# Patient Record
Sex: Male | Born: 1980 | Race: Black or African American | Hispanic: No | Marital: Married | Smoking: Never smoker
Health system: Southern US, Community
[De-identification: ages and names within clinical notes are randomized; demographics above are authoritative.]

## PROBLEM LIST (undated history)

## (undated) DIAGNOSIS — R569 Unspecified convulsions: Secondary | ICD-10-CM

## (undated) DIAGNOSIS — I10 Essential (primary) hypertension: Secondary | ICD-10-CM

## (undated) DIAGNOSIS — I499 Cardiac arrhythmia, unspecified: Secondary | ICD-10-CM

## (undated) DIAGNOSIS — F419 Anxiety disorder, unspecified: Secondary | ICD-10-CM

## (undated) DIAGNOSIS — K219 Gastro-esophageal reflux disease without esophagitis: Secondary | ICD-10-CM

## (undated) DIAGNOSIS — I480 Paroxysmal atrial fibrillation: Secondary | ICD-10-CM

## (undated) HISTORY — PX: NO PAST SURGERIES: SHX2092

---

## 2014-04-25 ENCOUNTER — Encounter (HOSPITAL_COMMUNITY): Payer: Self-pay | Admitting: Emergency Medicine

## 2014-04-25 ENCOUNTER — Emergency Department (INDEPENDENT_AMBULATORY_CARE_PROVIDER_SITE_OTHER)
Admission: EM | Admit: 2014-04-25 | Discharge: 2014-04-25 | Disposition: A | Discharge status: Home / Self Care | Payer: Self-pay | Source: Home / Self Care | Attending: Family Medicine | Admitting: Family Medicine

## 2014-04-25 ENCOUNTER — Emergency Department (INDEPENDENT_AMBULATORY_CARE_PROVIDER_SITE_OTHER): Payer: Self-pay

## 2014-04-25 DIAGNOSIS — M79672 Pain in left foot: Secondary | ICD-10-CM

## 2014-04-25 DIAGNOSIS — I10 Essential (primary) hypertension: Secondary | ICD-10-CM

## 2014-04-25 LAB — POCT I-STAT, CHEM 8
BUN: 18 mg/dL (ref 6–23)
CALCIUM ION: 1.17 mmol/L (ref 1.12–1.23)
CREATININE: 1.1 mg/dL (ref 0.50–1.35)
Chloride: 106 mmol/L (ref 96–112)
GLUCOSE: 109 mg/dL — AB (ref 70–99)
HEMATOCRIT: 47 % (ref 39.0–52.0)
HEMOGLOBIN: 16 g/dL (ref 13.0–17.0)
POTASSIUM: 3.6 mmol/L (ref 3.5–5.1)
Sodium: 138 mmol/L (ref 135–145)
TCO2: 19 mmol/L (ref 0–100)

## 2014-04-25 MED ORDER — AMLODIPINE BESYLATE 10 MG PO TABS: 10.0000 mg | ORAL_TABLET | Freq: Every day | ORAL | Status: DC

## 2014-04-25 NOTE — ED Notes (Signed)
Pt states that he started having left foot pain since last night at work pt denied fall or injury.

## 2014-04-25 NOTE — ED Provider Notes (Signed)
Devon Hanson is a 34 y.o. male who presents to Urgent Care today for left foot pain. Patient has a three-day history of left foot pain. He notes the pain occurred while walking. He denies any significant radiating pain weakness or numbness. The pain is located at the distal second metatarsal. Pain is worse with activity and better with rest. No fevers chills nausea vomiting or diarrhea. Ex  Patient was incidentally noted to have elevated blood pressure. He denies taking any medications for pain or other medicines. He denies any personal history of elevated blood pressure. He feels well otherwise. No syncope chest pain palpitations shortness of breath.   History reviewed. No pertinent past medical history. History reviewed. No pertinent past surgical history. History  Substance Use Topics  . Smoking status: Never Smoker   . Smokeless tobacco: Not on file  . Alcohol Use: Yes   ROS as above Medications: No current facility-administered medications for this encounter.   Current Outpatient Prescriptions  Medication Sig Dispense Refill  . amLODipine (NORVASC) 10 MG tablet Take 1 tablet (10 mg total) by mouth daily. 30 tablet 0   No Known Allergies   Exam:  BP 170/103 mmHg  Pulse 99  Temp(Src) 99.5 F (37.5 C) (Oral)  Resp 20  SpO2 99% Gen: Well NAD HEENT: EOMI,  MMM Lungs: Normal work of breathing. CTABL Heart: RRR no MRG Abd: NABS, Soft. Nondistended, Nontender Exts: Brisk capillary refill, warm and well perfused.  Left foot: Ankle nontender no swelling. Foot without any significant swelling. Mildly tender to palpation distal second metatarsal. Motion Refill sensation and strength intact. Pulses intact.  Results for orders placed or performed during the hospital encounter of 04/25/14 (from the past 24 hour(s))  I-STAT, chem 8     Status: Abnormal   Collection Time: 04/25/14  4:36 PM  Result Value Ref Range   Sodium 138 135 - 145 mmol/L   Potassium 3.6 3.5 - 5.1 mmol/L   Chloride 106 96 - 112 mmol/L   BUN 18 6 - 23 mg/dL   Creatinine, Ser 1.611.10 0.50 - 1.35 mg/dL   Glucose, Bld 096109 (H) 70 - 99 mg/dL   Calcium, Ion 0.451.17 4.091.12 - 1.23 mmol/L   TCO2 19 0 - 100 mmol/L   Hemoglobin 16.0 13.0 - 17.0 g/dL   HCT 81.147.0 91.439.0 - 78.252.0 %   Dg Foot Complete Left  04/25/2014   CLINICAL DATA:  Foot pain and burning after walking. No known injury. Initial encounter.  EXAM: LEFT FOOT - COMPLETE 3+ VIEW  COMPARISON:  None.  FINDINGS: The mineralization and alignment are normal. There is no evidence of acute fracture or dislocation. The joint spaces are maintained. Small plantar calcaneal spur noted.  IMPRESSION: No acute osseous findings.   Electronically Signed   By: Carey BullocksWilliam  Veazey M.D.   On: 04/25/2014 16:59    Assessment and Plan: 34 y.o. male with  1) foot pain: Unclear etiology. Likely a self-limiting process. We'll use postoperative shoe for a short duration and Tylenol for pain control. Return in about 2 weeks for recheck 2) hypertension: Start amlodipine. Return in 2 weeks  Discussed warning signs or symptoms. Please see discharge instructions. Patient expresses understanding.     Rodolph BongEvan S Bhavesh Vazquez, MD 04/25/14 [phone]

## 2014-04-25 NOTE — Discharge Instructions (Signed)
Thank you for coming in today. Use the postoperative she was needed. Take amlodipine daily. Return on the 29th, 30th, 31st. Ask for Dr. Denyse Amassorey.  Hypertension Hypertension, commonly called high blood pressure, is when the force of blood pumping through your arteries is too strong. Your arteries are the blood vessels that carry blood from your heart throughout your body. A blood pressure reading consists of a higher number over a lower number, such as 110/72. The higher number (systolic) is the pressure inside your arteries when your heart pumps. The lower number (diastolic) is the pressure inside your arteries when your heart relaxes. Ideally you want your blood pressure below 120/80. Hypertension forces your heart to work harder to pump blood. Your arteries may become narrow or stiff. Having hypertension puts you at risk for heart disease, stroke, and other problems.  RISK FACTORS Some risk factors for high blood pressure are controllable. Others are not.  Risk factors you cannot control include:   Race. You may be at higher risk if you are African American.  Age. Risk increases with age.  Gender. Men are at higher risk than women before age 645 years. After age 32, women are at higher risk than men. Risk factors you can control include:  Not getting enough exercise or physical activity.  Being overweight.  Getting too much fat, sugar, calories, or salt in your diet.  Drinking too much alcohol. SIGNS AND SYMPTOMS Hypertension does not usually cause signs or symptoms. Extremely high blood pressure (hypertensive crisis) may cause headache, anxiety, shortness of breath, and nosebleed. DIAGNOSIS  To check if you have hypertension, your health care provider will measure your blood pressure while you are seated, with your arm held at the level of your heart. It should be measured at least twice using the same arm. Certain conditions can cause a difference in blood pressure between your right and  left arms. A blood pressure reading that is higher than normal on one occasion does not mean that you need treatment. If one blood pressure reading is high, ask your health care provider about having it checked again. TREATMENT  Treating high blood pressure includes making lifestyle changes and possibly taking medicine. Living a healthy lifestyle can help lower high blood pressure. You may need to change some of your habits. Lifestyle changes may include:  Following the DASH diet. This diet is high in fruits, vegetables, and whole grains. It is low in salt, red meat, and added sugars.  Getting at least 2 hours of brisk physical activity every week.  Losing weight if necessary.  Not smoking.  Limiting alcoholic beverages.  Learning ways to reduce stress. If lifestyle changes are not enough to get your blood pressure under control, your health care provider may prescribe medicine. You may need to take more than one. Work closely with your health care provider to understand the risks and benefits. HOME CARE INSTRUCTIONS  Have your blood pressure rechecked as directed by your health care provider.   Take medicines only as directed by your health care provider. Follow the directions carefully. Blood pressure medicines must be taken as prescribed. The medicine does not work as well when you skip doses. Skipping doses also puts you at risk for problems.   Do not smoke.   Monitor your blood pressure at home as directed by your health care provider. SEEK MEDICAL CARE IF:   You think you are having a reaction to medicines taken.  You have recurrent headaches or feel dizzy.  You have swelling in your ankles.  You have trouble with your vision. SEEK IMMEDIATE MEDICAL CARE IF:  You develop a severe headache or confusion.  You have unusual weakness, numbness, or feel faint.  You have severe chest or abdominal pain.  You vomit repeatedly.  You have trouble breathing. MAKE SURE  YOU:   Understand these instructions.  Will watch your condition.  Will get help right away if you are not doing well or get worse. Document Released: 02/01/2005 Document Revised: 06/18/2013 Document Reviewed: 11/24/2012 [facility] Patient Information 2015 [place], [place]. This information is not intended to replace advice given to you by your health care provider. Make sure you discuss any questions you have with your health care provider.

## 2016-09-26 ENCOUNTER — Emergency Department (HOSPITAL_COMMUNITY): Payer: Managed Care, Other (non HMO)

## 2016-09-26 ENCOUNTER — Encounter (HOSPITAL_COMMUNITY): Payer: Self-pay | Admitting: *Deleted

## 2016-09-26 ENCOUNTER — Emergency Department (HOSPITAL_COMMUNITY)
Admission: EM | Admit: 2016-09-26 | Discharge: 2016-09-26 | Disposition: A | Discharge status: Home / Self Care | Payer: Managed Care, Other (non HMO) | Attending: Emergency Medicine | Admitting: Emergency Medicine

## 2016-09-26 DIAGNOSIS — I10 Essential (primary) hypertension: Secondary | ICD-10-CM | POA: Diagnosis not present

## 2016-09-26 DIAGNOSIS — Z79899 Other long term (current) drug therapy: Secondary | ICD-10-CM | POA: Diagnosis not present

## 2016-09-26 HISTORY — DX: Essential (primary) hypertension: I10

## 2016-09-26 LAB — CBC
HCT: 41.4 % (ref 39.0–52.0)
Hemoglobin: 14.7 g/dL (ref 13.0–17.0)
MCH: 31.2 pg (ref 26.0–34.0)
MCHC: 35.5 g/dL (ref 30.0–36.0)
MCV: 87.9 fL (ref 78.0–100.0)
Platelets: 287 10*3/uL (ref 150–400)
RBC: 4.71 MIL/uL (ref 4.22–5.81)
RDW: 12.4 % (ref 11.5–15.5)
WBC: 5.5 10*3/uL (ref 4.0–10.5)

## 2016-09-26 LAB — BASIC METABOLIC PANEL
Anion gap: 10 (ref 5–15)
BUN: 9 mg/dL (ref 6–20)
CALCIUM: 9.2 mg/dL (ref 8.9–10.3)
CO2: 23 mmol/L (ref 22–32)
Chloride: 102 mmol/L (ref 101–111)
Creatinine, Ser: 1.14 mg/dL (ref 0.61–1.24)
GFR calc Af Amer: >60 mL/min (ref >60)
GLUCOSE: 106 mg/dL — AB (ref 65–99)
Potassium: 3.3 mmol/L — ABNORMAL LOW (ref 3.5–5.1)
Sodium: 135 mmol/L (ref 135–145)

## 2016-09-26 LAB — TROPONIN I: Troponin I: 0.03 ng/mL (ref <0.03)

## 2016-09-26 MED ORDER — METOPROLOL TARTRATE 25 MG PO TABS: 25.0000 mg | ORAL_TABLET | Freq: Two times a day (BID) | ORAL | 0 refills | Status: DC

## 2016-09-26 MED ORDER — AMLODIPINE BESYLATE 10 MG PO TABS: 10.0000 mg | ORAL_TABLET | Freq: Every day | ORAL | 0 refills | Status: DC

## 2016-09-26 NOTE — ED Provider Notes (Signed)
[facility]EMER[facility]ENCY DEPT Provider Note   CSN: [id number] Arrival date & time: 09/26/16  1708     History   Chief Complaint Chief Complaint  Patient presents with  . Hypertension    HPI Devon Hanson is a 36 y.o. male.  Pt presents to the ED today with htn.  The pt does not have a hx of htn and said he's been taking his norvasc.  He does not have a pcp, but has been getting his medication from "clinics."  The pt told triage nurse that he had some cp earlier, but none now.  Pt denies any headache.      Past Medical History:  Diagnosis Date  . Hypertension     There are no active problems to display for this patient.   History reviewed. No pertinent surgical history.     Home Medications    Prior to Admission medications   Medication Sig Start Date End Date Taking? Authorizing Provider  amLODipine (NORVASC) 10 MG tablet Take 1 tablet (10 mg total) by mouth daily. 09/26/16   Jacalyn LefevreHaviland, Trystian Crisanto, MD  metoprolol tartrate (LOPRESSOR) 25 MG tablet Take 1 tablet (25 mg total) by mouth 2 (two) times daily. 09/26/16   Jacalyn LefevreHaviland, Torra Pala, MD    Family History No family history on file.  Social History Social History  Substance Use Topics  . Smoking status: Never Smoker  . Smokeless tobacco: Never Used  . Alcohol use Yes     Allergies   Patient has no known allergies.   Review of Systems Review of Systems  All other systems reviewed and are negative.    Physical Exam Updated Vital Signs BP (!) 174/99 (BP Location: Right Arm)   Pulse 95   Temp 98.1 F (36.7 C) (Oral)   Resp 18   Ht 5\' 8"  (1.727 m)   Wt 99.3 kg (219 lb)   SpO2 95%   BMI 33.30 kg/m   Physical Exam  Constitutional: He is oriented to person, place, and time. He appears well-developed and well-nourished.  HENT:  Head: Normocephalic and atraumatic.  Right Ear: External ear normal.  Left Ear: External ear normal.  Nose: Nose normal.  Mouth/Throat: Oropharynx is clear and moist.  Eyes: Pupils  are equal, round, and reactive to light. Conjunctivae and EOM are normal.  Neck: Normal range of motion. Neck supple.  Cardiovascular: Normal rate, regular rhythm, normal heart sounds and intact distal pulses.   Pulmonary/Chest: Effort normal and breath sounds normal.  Abdominal: Soft. Bowel sounds are normal.  Musculoskeletal: Normal range of motion.  Neurological: He is alert and oriented to person, place, and time.  Skin: Skin is warm.  Psychiatric: He has a normal mood and affect. His behavior is normal. Judgment and thought content normal.  Nursing note and vitals reviewed.    ED Treatments / Results  Labs (all labs ordered are listed, but only abnormal results are displayed) Labs Reviewed  BASIC METABOLIC PANEL - Abnormal; Notable for the following:       Result Value   Potassium 3.3 (*)    Glucose, Bld 106 (*)    All other components within normal limits  TROPONIN I - Abnormal; Notable for the following:    Troponin I 0.03 (*)    All other components within normal limits  CBC    EKG  EKG Interpretation  Date/Time:  Sunday September 26 2016 17:41:15 EDT Ventricular Rate:  92 PR Interval:  170 QRS Duration: 78 QT Interval:  342 QTC Calculation: 422  R Axis:     Text Interpretation:  Normal sinus rhythm Normal ECG Confirmed by Jacalyn Lefevre [phone]) on 09/26/2016 6:59:25 PM       Radiology Dg Chest 2 View  Result Date: 09/26/2016 CLINICAL DATA:  Tightness in chest.  Elevated blood pressure. EXAM: CHEST  2 VIEW COMPARISON:  None. FINDINGS: The heart size and mediastinal contours are within normal limits. Both lungs are clear. The visualized skeletal structures are unremarkable. IMPRESSION: No active cardiopulmonary disease. Electronically Signed   By: Gerome Sam III M.D   On: 09/26/2016 18:59    Procedures Procedures (including critical care time)  Medications Ordered in ED Medications - No data to display   Initial Impression / Assessment and Plan / ED  Course  I have reviewed the triage vital signs and the nursing notes.  Pertinent labs & imaging results that were available during my care of the patient were reviewed by me and considered in my medical decision making (see chart for details).     Pt is on max dose of amlodipine.  I will add beta blocker.  Case management consulted to help get pt a doctor as his bp needs to be better managed.  Pt knows to return for any concerns and f/u with pcp.  Final Clinical Impressions(s) / ED Diagnoses   Final diagnoses:  Essential hypertension    New Prescriptions New Prescriptions   METOPROLOL TARTRATE (LOPRESSOR) 25 MG TABLET    Take 1 tablet (25 mg total) by mouth 2 (two) times daily.     Jacalyn Lefevre, MD 09/26/16 [phone]

## 2016-09-26 NOTE — ED Triage Notes (Signed)
The pt is c/o his bp being high he is on bp meds  That he reports he has been taking  His symptoms started today

## 2016-09-26 NOTE — ED Triage Notes (Signed)
The pt also reports that while he was walking around at home he had some minor chest pain no hx of cardiac disease

## 2016-09-26 NOTE — ED Provider Notes (Signed)
[facility] Provider Note   CSN: [id number] Arrival date & time: 09/26/16  1708     History   Chief Complaint Chief Complaint  Patient presents with  . Hypertension    HPI Devon Hanson is a 36 y.o. male.  Pt is a 36 yo male with a PMH of HTN who presents with high blood pressure. Pt states earlier today, he became jittery, a little dizzy and some tightness in his chest. He checked his BP and it was 170/98 so he took is BP meds. He is on Amlodipine 10mg  once a day. He states he usually take it early in the afternoon but he forgot this time.      Past Medical History:  Diagnosis Date  . Hypertension     There are no active problems to display for this patient.   History reviewed. No pertinent surgical history.     Home Medications    Prior to Admission medications   Medication Sig Start Date End Date Taking? Authorizing Provider  amLODipine (NORVASC) 10 MG tablet Take 1 tablet (10 mg total) by mouth daily. 09/26/16   Jacalyn Lefevre, MD  metoprolol tartrate (LOPRESSOR) 25 MG tablet Take 1 tablet (25 mg total) by mouth 2 (two) times daily. 09/26/16   Jacalyn Lefevre, MD    Family History No family history on file.  Social History Social History  Substance Use Topics  . Smoking status: Never Smoker  . Smokeless tobacco: Never Used  . Alcohol use Yes     Allergies   Patient has no known allergies.   Review of Systems Review of Systems   Physical Exam Updated Vital Signs BP (!) 174/99 (BP Location: Right Arm)   Pulse 95   Temp 98.1 F (36.7 C) (Oral)   Resp 18   Ht 5\' 8"  (1.727 m)   Wt 99.3 kg (219 lb)   SpO2 95%   BMI 33.30 kg/m   Physical Exam   ED Treatments / Results  Labs (all labs ordered are listed, but only abnormal results are displayed) Labs Reviewed  BASIC METABOLIC PANEL - Abnormal; Notable for the following:       Result Value   Potassium 3.3 (*)    Glucose, Bld 106 (*)    All other components within normal  limits  TROPONIN I - Abnormal; Notable for the following:    Troponin I 0.03 (*)    All other components within normal limits  CBC    EKG  EKG Interpretation  Date/Time:  Sunday September 26 2016 17:41:15 EDT Ventricular Rate:  92 PR Interval:  170 QRS Duration: 78 QT Interval:  342 QTC Calculation: 422 R Axis:     Text Interpretation:  Normal sinus rhythm Normal ECG Confirmed by Jacalyn Lefevre [phone]) on 09/26/2016 6:59:25 PM       Radiology Dg Chest 2 View  Result Date: 09/26/2016 CLINICAL DATA:  Tightness in chest.  Elevated blood pressure. EXAM: CHEST  2 VIEW COMPARISON:  None. FINDINGS: The heart size and mediastinal contours are within normal limits. Both lungs are clear. The visualized skeletal structures are unremarkable. IMPRESSION: No active cardiopulmonary disease. Electronically Signed   By: Gerome Sam III M.D   On: 09/26/2016 18:59    Procedures Procedures (including critical care time)  Medications Ordered in ED Medications - No data to display   Initial Impression / Assessment and Plan / ED Course  I have reviewed the triage vital signs and the nursing notes.  Pertinent labs &  imaging results that were available during my care of the patient were reviewed by me and considered in my medical decision making (see chart for details).    Case management consulted in epic to try to connect pt with pcp.  Pt is on max dose of amlodipine, so he will be started on lopressor until he can follow with pcp.  Final Clinical Impressions(s) / ED Diagnoses   Final diagnoses:  Essential hypertension    New Prescriptions New Prescriptions   METOPROLOL TARTRATE (LOPRESSOR) 25 MG TABLET    Take 1 tablet (25 mg total) by mouth 2 (two) times daily.     Jacalyn LefevreHaviland, Charlese Gruetzmacher, MD 09/29/16 [phone]

## 2016-09-26 NOTE — ED Notes (Signed)
Called for Pt in waiting area, Pt not present.

## 2016-09-27 ENCOUNTER — Telehealth: Payer: Self-pay | Admitting: *Deleted

## 2016-09-27 NOTE — Telephone Encounter (Signed)
Jahmya Onofrio J. Lucretia RoersWood, RN, BSN, [place] [phone]  [facility]  [facility] September [facility] @ 2:00.  Spoke with pt at via telephone and advised to please arrive 15 min early and take a picture ID and your current medications.  Pt verbalizes understanding of keeping appointment.

## 2016-10-21 ENCOUNTER — Inpatient Hospital Stay (INDEPENDENT_AMBULATORY_CARE_PROVIDER_SITE_OTHER): Payer: Managed Care, Other (non HMO) | Admitting: Physician Assistant

## 2017-05-04 ENCOUNTER — Emergency Department (HOSPITAL_COMMUNITY)
Admission: EM | Admit: 2017-05-04 | Discharge: 2017-05-04 | Disposition: A | Discharge status: Home / Self Care | Payer: BLUE CROSS/BLUE SHIELD | Attending: Emergency Medicine | Admitting: Emergency Medicine

## 2017-05-04 ENCOUNTER — Encounter (HOSPITAL_COMMUNITY): Payer: Self-pay | Admitting: Emergency Medicine

## 2017-05-04 ENCOUNTER — Emergency Department (HOSPITAL_COMMUNITY): Payer: BLUE CROSS/BLUE SHIELD

## 2017-05-04 DIAGNOSIS — Z79899 Other long term (current) drug therapy: Secondary | ICD-10-CM | POA: Insufficient documentation

## 2017-05-04 DIAGNOSIS — I10 Essential (primary) hypertension: Secondary | ICD-10-CM | POA: Insufficient documentation

## 2017-05-04 DIAGNOSIS — R009 Unspecified abnormalities of heart beat: Secondary | ICD-10-CM | POA: Diagnosis present

## 2017-05-04 DIAGNOSIS — Z9289 Personal history of other medical treatment: Secondary | ICD-10-CM

## 2017-05-04 DIAGNOSIS — I4891 Unspecified atrial fibrillation: Secondary | ICD-10-CM | POA: Diagnosis not present

## 2017-05-04 HISTORY — DX: Personal history of other medical treatment: Z92.89

## 2017-05-04 LAB — CBC
HCT: 43.6 % (ref 39.0–52.0)
Hemoglobin: 14.8 g/dL (ref 13.0–17.0)
MCH: 31.1 pg (ref 26.0–34.0)
MCHC: 33.9 g/dL (ref 30.0–36.0)
MCV: 91.6 fL (ref 78.0–100.0)
Platelets: 294 10*3/uL (ref 150–400)
RBC: 4.76 MIL/uL (ref 4.22–5.81)
RDW: 12.5 % (ref 11.5–15.5)
WBC: 5.3 10*3/uL (ref 4.0–10.5)

## 2017-05-04 LAB — BASIC METABOLIC PANEL
Anion gap: 11 (ref 5–15)
BUN: 14 mg/dL (ref 6–20)
CHLORIDE: 103 mmol/L (ref 101–111)
CO2: 23 mmol/L (ref 22–32)
CREATININE: 1.07 mg/dL (ref 0.61–1.24)
Calcium: 9.7 mg/dL (ref 8.9–10.3)
GFR calc Af Amer: >60 mL/min (ref >60)
GFR calc non Af Amer: >60 mL/min (ref >60)
Glucose, Bld: 119 mg/dL — ABNORMAL HIGH (ref 65–99)
Potassium: 3.3 mmol/L — ABNORMAL LOW (ref 3.5–5.1)
Sodium: 137 mmol/L (ref 135–145)

## 2017-05-04 LAB — MAGNESIUM: MAGNESIUM: 1.8 mg/dL (ref 1.7–2.4)

## 2017-05-04 LAB — I-STAT TROPONIN, ED: Troponin i, poc: 0.01 ng/mL (ref 0.00–0.08)

## 2017-05-04 MED ORDER — ASPIRIN EC 81 MG PO TBEC: 81.0000 mg | DELAYED_RELEASE_TABLET | Freq: Every day | ORAL | 0 refills | Status: DC

## 2017-05-04 MED ORDER — DILTIAZEM HCL-DEXTROSE 100-5 MG/100ML-% IV SOLN (PREMIX)
5.0000 mg/h | INTRAVENOUS | Status: DC
  Administered 2017-05-04: 5 mg/h via INTRAVENOUS
  Filled 2017-05-04: qty 100

## 2017-05-04 MED ORDER — PROPOFOL 10 MG/ML IV BOLUS
0.5000 mg/kg | Freq: Once | INTRAVENOUS | Status: AC
  Administered 2017-05-04: 48.8 mg via INTRAVENOUS

## 2017-05-04 MED ORDER — PROPOFOL 10 MG/ML IV BOLUS
0.5000 mg/kg | Freq: Once | INTRAVENOUS | Status: DC
  Filled 2017-05-04: qty 20

## 2017-05-04 MED ORDER — DILTIAZEM LOAD VIA INFUSION
20.0000 mg | Freq: Once | INTRAVENOUS | Status: AC
  Administered 2017-05-04: 20 mg via INTRAVENOUS
  Filled 2017-05-04: qty 20

## 2017-05-04 NOTE — Sedation Documentation (Addendum)
Family updated as to patient's status.

## 2017-05-04 NOTE — Sedation Documentation (Signed)
20 propofol by MD

## 2017-05-04 NOTE — Sedation Documentation (Signed)
20 propofol by MD 

## 2017-05-04 NOTE — Sedation Documentation (Signed)
20 propofol by MD at this time

## 2017-05-04 NOTE — Sedation Documentation (Signed)
10 propofol by MD

## 2017-05-04 NOTE — Sedation Documentation (Signed)
10 propofol by MD 

## 2017-05-04 NOTE — Sedation Documentation (Signed)
Pt cardioverted 150 jewels

## 2017-05-04 NOTE — Sedation Documentation (Signed)
Pt cardioverted 120 jewels w/o complication

## 2017-05-04 NOTE — Sedation Documentation (Signed)
Patient is resting comfortably. 

## 2017-05-04 NOTE — ED Triage Notes (Signed)
Pt was at work today when felt like heart was beating funny. Nurse said hear rate would be low then be high.

## 2017-05-04 NOTE — ED Provider Notes (Addendum)
[facility]-EMERGENCY DEPT Provider Note   CSN: [id number] Arrival date & time: 05/04/17  1416     History   Chief Complaint Chief Complaint  Patient presents with  . irregular heartbeat    HPI Devon Hanson is a 37 y.o. male.  Patient is a 37 year old male with a history of hypertension on amlodipine and metoprolol daily and took them today presenting with palpitations today.  Patient states that he was at work today when suddenly he felt like his heart was racing and skipping beats.  He denies shortness of breath, chest pain, nausea or vomiting.  He has not had syncope, near syncope or feeling lightheaded.  He states in the past he has had occasional skipped beats but nothing that has lasted this length of time.  He denies any tobacco or drug use.  He does drink alcohol regularly.  He states enough to feel tipsy sometimes 5-6 times per day.  He did recently have URI symptoms last week but states other than a nonproductive cough currently all of his symptoms are improving and denies any fever.  He does not drink excessive caffeine and is not been using any stimulants.  He has had no recent long travel, unilateral leg swelling or pain and no family history of cardiac or clotting disorder.   The history is provided by the patient.    Past Medical History:  Diagnosis Date  . Hypertension     There are no active problems to display for this patient.   History reviewed. No pertinent surgical history.     Home Medications    Prior to Admission medications   Medication Sig Start Date End Date Taking? Authorizing Provider  amLODipine (NORVASC) 10 MG tablet Take 1 tablet (10 mg total) by mouth daily. 09/26/16  Yes Jacalyn Lefevre, MD  ibuprofen (ADVIL,MOTRIN) 200 MG tablet Take 400 mg by mouth daily as needed (PAIN).   Yes [provider]  metoprolol tartrate (LOPRESSOR) 25 MG tablet Take 1 tablet (25 mg total) by mouth 2 (two) times daily. 09/26/16   Yes Jacalyn Lefevre, MD    Family History No family history on file.  Social History Social History   Tobacco Use  . Smoking status: Never Smoker  . Smokeless tobacco: Never Used  Substance Use Topics  . Alcohol use: Yes  . Drug use: Not on file     Allergies   Patient has no known allergies.   Review of Systems Review of Systems  All other systems reviewed and are negative.    Physical Exam Updated Vital Signs BP (!) 138/99   Pulse 78   Resp 13   Ht 5\' 10"  (1.778 m)   Wt 97.5 kg (215 lb)   SpO2 97%   BMI 30.85 kg/m   Physical Exam  Constitutional: He is oriented to person, place, and time. He appears well-developed and well-nourished. No distress.  HENT:  Head: Normocephalic and atraumatic.  Mouth/Throat: Oropharynx is clear and moist.  Eyes: Conjunctivae and EOM are normal. Pupils are equal, round, and reactive to light.  Neck: Normal range of motion. Neck supple.  Cardiovascular: Intact distal pulses. An irregularly irregular rhythm present. Tachycardia present.  No murmur heard. Pulmonary/Chest: Effort normal and breath sounds normal. No respiratory distress. He has no wheezes. He has no rales.  Abdominal: Soft. He exhibits no distension. There is no tenderness. There is no rebound and no guarding.  Musculoskeletal: Normal range of motion. He exhibits no edema or tenderness.  Neurological: He is alert and oriented to person, place, and time.  Skin: Skin is warm and dry. No rash noted. No erythema.  Psychiatric: He has a normal mood and affect. His behavior is normal.  Nursing note and vitals reviewed.    ED Treatments / Results  Labs (all labs ordered are listed, but only abnormal results are displayed) Labs Reviewed  BASIC METABOLIC PANEL - Abnormal; Notable for the following components:      Result Value   Potassium 3.3 (*)    Glucose, Bld 119 (*)    All other components within normal limits  CBC  MAGNESIUM  I-STAT TROPONIN, ED    EKG   EKG Interpretation  Date/Time:  Wednesday May 04 2017 14:29:54 EDT Ventricular Rate:  161 PR Interval:    QRS Duration: 83 QT Interval:  296 QTC Calculation: 485 R Axis:   -11 Text Interpretation:  new  Atrial fibrillation with rapid ventricular response Borderline prolonged QT interval Confirmed by Gwyneth SproutPlunkett, Mima Cranmore ([id number]) on 05/04/2017 3:19:08 PM       EKG Interpretation  Date/Time:  Wednesday May 04 2017 16:55:47 EDT Ventricular Rate:  83 PR Interval:    QRS Duration: 86 QT Interval:  365 QTC Calculation: 429 R Axis:   -5 Text Interpretation:  Sinus rhythm Borderline T abnormalities, anterior leads ST elevation, consider lateral injury Atrial fibrillation RESOLVED SINCE PREVIOUS Confirmed by Gwyneth SproutPlunkett, Maylen Waltermire ([id number]) on 05/04/2017 5:27:57 PM       Radiology Dg Chest 2 View  Result Date: 05/04/2017 CLINICAL DATA:  Left-sided chest discomfort. Irregular heart rhythm. EXAM: CHEST - 2 VIEW COMPARISON:  Chest x-ray dated September 26, 2016. FINDINGS: The heart size and mediastinal contours are within normal limits. Both lungs are clear. The visualized skeletal structures are unremarkable. IMPRESSION: No active cardiopulmonary disease. Electronically Signed   By: Obie DredgeWilliam T Derry M.D.   On: 05/04/2017 14:50    Procedures .Sedation Date/Time: 05/04/2017 4:55 PM Performed by: Gwyneth SproutPlunkett, Zachary Lovins, MD Authorized by: Gwyneth SproutPlunkett, Lakeitha Basques, MD   Consent:    Consent obtained:  Verbal   Consent given by:  Patient   Risks discussed:  Allergic reaction, dysrhythmia, inadequate sedation, nausea, prolonged hypoxia resulting in organ damage, prolonged sedation necessitating reversal, respiratory compromise necessitating ventilatory assistance and intubation and vomiting   Alternatives discussed:  Analgesia without sedation, anxiolysis and regional anesthesia Universal protocol:    Procedure explained and questions answered to patient or proxy's satisfaction: yes     Relevant documents present  and verified: yes     Test results available and properly labeled: yes     Imaging studies available: yes     Required blood products, implants, devices, and special equipment available: yes     Site/side marked: yes     Immediately prior to procedure a time out was called: yes     Patient identity confirmation method:  Verbally with patient Indications:    Procedure performed:  Cardioversion   Procedure necessitating sedation performed by:  Physician performing sedation   Intended level of sedation:  Deep Pre-sedation assessment:    Time since last food or drink:  6 hours   ASA classification: class 2 - patient with mild systemic disease     Neck mobility: normal     Mouth opening:  3 or more finger widths   Thyromental distance:  4 finger widths   Mallampati score:  I - soft palate, uvula, fauces, pillars visible   Pre-sedation assessments completed and reviewed: airway patency, cardiovascular function, hydration status,  mental status, nausea/vomiting, pain level, respiratory function and temperature     Pre-sedation assessment completed:  05/04/2017 3:55 PM Immediate pre-procedure details:    Reassessment: Patient reassessed immediately prior to procedure     Reviewed: vital signs, relevant labs/tests and NPO status     Verified: bag valve mask available, emergency equipment available, intubation equipment available, IV patency confirmed, oxygen available and suction available   Procedure details (see MAR for exact dosages):    Preoxygenation:  Nasal cannula   Sedation:  Propofol   Analgesia:  None   Intra-procedure monitoring:  Blood pressure monitoring, cardiac monitor, continuous pulse oximetry, frequent LOC assessments, frequent vital sign checks and continuous capnometry   Intra-procedure events: none     Total Provider sedation time (minutes):  20 Post-procedure details:    Post-sedation assessment completed:  05/04/2017 4:56 PM   Attendance: Constant attendance by certified  staff until patient recovered     Recovery: Patient returned to pre-procedure baseline     Post-sedation assessments completed and reviewed: airway patency, cardiovascular function, hydration status, mental status, nausea/vomiting, pain level, respiratory function and temperature     Patient is stable for discharge or admission: yes     Patient tolerance:  Tolerated well, no immediate complications .Cardioversion Date/Time: 05/04/2017 4:56 PM Performed by: Gwyneth Sprout, MD Authorized by: Gwyneth Sprout, MD   Consent:    Consent obtained:  Written   Consent given by:  Patient   Risks discussed:  Induced arrhythmia and pain   Alternatives discussed:  No treatment Pre-procedure details:    Cardioversion basis:  Elective   Rhythm:  Atrial fibrillation   Electrode placement:  Anterior-posterior Patient sedated: Yes. Refer to sedation procedure documentation for details of sedation.  Attempt one:    Cardioversion mode:  Synchronous   Waveform:  Biphasic   Shock (Joules):  120   Shock outcome:  No change in rhythm Attempt two:    Cardioversion mode:  Synchronous   Waveform:  Biphasic   Shock (Joules):  150   Shock outcome:  Conversion to normal sinus rhythm Post-procedure details:    Patient status:  Awake   Patient tolerance of procedure:  Tolerated well, no immediate complications   (including critical care time)  Medications Ordered in ED Medications  diltiazem (CARDIZEM) 1 mg/mL load via infusion 20 mg (20 mg Intravenous Bolus from Bag 05/04/17 1544)    And  diltiazem (CARDIZEM) 100 mg in dextrose 5% (1 mg/mL) infusion (0 mg/hr Intravenous Stopped 05/04/17 1620)  propofol (DIPRIVAN) 10 mg/mL bolus/IV push 48.8 mg (48.8 mg Intravenous Given by Other 05/04/17 1638)     Initial Impression / Assessment and Plan / ED Course  I have reviewed the triage vital signs and the nursing notes.  Pertinent labs & imaging results that were available during my care of the  patient were reviewed by me and considered in my medical decision making (see chart for details).     Patient is a young male with a history of hypertension presenting with new onset of A. fib RVR.  Patient symptoms started acutely today.  He is not on anticoagulation regularly.  He denies any recent medical condition that would be causing stress to his body that would result in A. fib.  He does not appear septic and low suspicion for PE.  Patient is having no chest pain and low suspicion for ACS.  Feel that patient would be a good candidate for cardioversion ensuring that his labs are within normal limits.  Patient's blood pressure stable. Patient given Cardizem initially while waiting on labs.  Patient's x-ray is within normal limits.  Chadvasc of 1 making pt low risk.  CHA2DS2/VAS Stroke Risk Points      N/A >= 2 Points: High Risk  1 - 1.99 Points: Medium Risk  0 Points: Low Risk    A final score could not be computed because of missing components.:   Change:1    This score determines the patient's risk of having a stroke if the  patient has atrial fibrillation.      This score is not applicable to this patient. Components are not  calculated.    4:53 PM Labs are reassuring and cardizem slowed rate but still in a.fib.  Pt consented to cardioversion with propfol sedation as above.  After 2 attempt first at 120J then at 150J pt cardioverted back to sinus rhythm.  Will have his start an aspirin and f/u in afib clinic.  Patient to continue his metoprolol currently.CRITICAL CARE Performed by: Geneve Kimpel Total critical care time: 30 minutes Critical care time was exclusive of separately billable procedures and treating other patients. Critical care was necessary to treat or prevent imminent or life-threatening deterioration. Critical care was time spent personally by me on the following activities: development of treatment plan with patient and/or surrogate as well as nursing,  discussions with consultants, evaluation of patient's response to treatment, examination of patient, obtaining history from patient or surrogate, ordering and performing treatments and interventions, ordering and review of laboratory studies, ordering and review of radiographic studies, pulse oximetry and re-evaluation of patient's condition.      Final Clinical Impressions(s) / ED Diagnoses   Final diagnoses:  Atrial fibrillation with rapid ventricular response [facility])    ED Discharge Orders        Ordered    Amb referral to AFIB Clinic     05/04/17 1658    aspirin EC 81 MG tablet  Daily     05/04/17 1659       Gwyneth Sprout, MD 05/04/17 1659    Gwyneth Sprout, MD 05/04/17 1728    Gwyneth Sprout, MD 05/16/17 [phone]

## 2017-05-11 ENCOUNTER — Encounter (HOSPITAL_COMMUNITY): Payer: Self-pay | Admitting: Nurse Practitioner

## 2017-05-11 ENCOUNTER — Ambulatory Visit (HOSPITAL_COMMUNITY)
Admission: RE | Admit: 2017-05-11 | Discharge: 2017-05-11 | Disposition: A | Discharge status: Home / Self Care | Payer: BLUE CROSS/BLUE SHIELD | Source: Ambulatory Visit | Attending: Nurse Practitioner | Admitting: Nurse Practitioner

## 2017-05-11 VITALS — BP 142/94 | HR 69 | Ht 70.0 in | Wt 218.0 lb

## 2017-05-11 DIAGNOSIS — I480 Paroxysmal atrial fibrillation: Secondary | ICD-10-CM

## 2017-05-11 DIAGNOSIS — R5383 Other fatigue: Secondary | ICD-10-CM | POA: Diagnosis not present

## 2017-05-11 DIAGNOSIS — Z79899 Other long term (current) drug therapy: Secondary | ICD-10-CM | POA: Insufficient documentation

## 2017-05-11 DIAGNOSIS — R0683 Snoring: Secondary | ICD-10-CM | POA: Diagnosis not present

## 2017-05-11 DIAGNOSIS — I10 Essential (primary) hypertension: Secondary | ICD-10-CM | POA: Diagnosis not present

## 2017-05-11 DIAGNOSIS — Z7982 Long term (current) use of aspirin: Secondary | ICD-10-CM | POA: Insufficient documentation

## 2017-05-11 HISTORY — DX: Paroxysmal atrial fibrillation: I48.0

## 2017-05-11 MED ORDER — APIXABAN 5 MG PO TABS: 5.0000 mg | ORAL_TABLET | Freq: Two times a day (BID) | ORAL | 0 refills | Status: DC

## 2017-05-11 NOTE — Progress Notes (Signed)
Primary Care Physician: none Primary Cardiologist: none Referring Physician: Dr [facility])   Devon Hanson is a 37 y.o. male with a history of paroxysmal atrial fibrillation who presents for consultation in the [facility].  The patient was initially diagnosed with atrial fibrillation 05/04/17 after presenting with symptoms of tachypalpitations .  He reports being at work when he developed abrupt tachypalpitations. He presented to [facility] ED and was found to have AF with RVR.  He was cardioverted and discharged with plans to follow-up in the AF clinic.  He was given ASA.   Today, he denies symptoms of palpitations, chest pain, shortness of breath, orthopnea, PND, lower extremity edema, dizziness, presyncope, syncope, snoring, daytime somnolence, bleeding, or neurologic sequela. The patient is tolerating medications without difficulties and is otherwise without complaint today.    Atrial Fibrillation Risk Factors:  he has  Not had sleep study,  + snores and does not feel well resting when waking  he does not have a history of rheumatic fever.  he does have a history of alcohol use.  he has a BMI of Body mass index is 31.28 kg/m.Marland Kitchen Filed Weights   05/11/17 1411  Weight: 218 lb (98.9 kg)    LA size: unknown   Atrial Fibrillation Management history:  Previous antiarrhythmic drugs: none  Previous cardioversions: 05/04/17  Previous ablations: none  CHADS2VASC score: 1  Anticoagulation history: ASA   Past Medical History:  Diagnosis Date  . Hypertension   . Paroxysmal atrial fibrillation (HCC)    No past surgical history on file.  Current Outpatient Medications  Medication Sig Dispense Refill  . amLODipine (NORVASC) 10 MG tablet Take 1 tablet (10 mg total) by mouth daily. 30 tablet 0  . aspirin EC 81 MG tablet Take 1 tablet (81 mg total) by mouth daily. 30 tablet 0  . ibuprofen (ADVIL,MOTRIN) 200 MG tablet Take 400 mg by mouth daily as  needed (PAIN).    Marland Kitchen metoprolol tartrate (LOPRESSOR) 25 MG tablet Take 1 tablet (25 mg total) by mouth 2 (two) times daily. 60 tablet 0   No current facility-administered medications for this encounter.     No Known Allergies  Social History   Socioeconomic History  . Marital status: Married    Spouse name: Not on file  . Number of children: Not on file  . Years of education: Not on file  . Highest education level: Not on file  Occupational History  . Not on file  Social Needs  . Financial resource strain: Not on file  . Food insecurity:    Worry: Not on file    Inability: Not on file  . Transportation needs:    Medical: Not on file    Non-medical: Not on file  Tobacco Use  . Smoking status: Never Smoker  . Smokeless tobacco: Never Used  Substance and Sexual Activity  . Alcohol use: Yes    Comment: moderate liquor intake  . Drug use: Not on file  . Sexual activity: Not on file  Lifestyle  . Physical activity:    Days per week: Not on file    Minutes per session: Not on file  . Stress: Not on file  Relationships  . Social connections:    Talks on phone: Not on file    Gets together: Not on file    Attends religious service: Not on file    Active member of club or organization: Not on file    Attends  meetings of clubs or organizations: Not on file    Relationship status: Not on file  . Intimate partner violence:    Fear of current or ex partner: Not on file    Emotionally abused: Not on file    Physically abused: Not on file    Forced sexual activity: Not on file  Other Topics Concern  . Not on file  Social History Narrative   Lives in [place] with wife, children (ages 5513,8,4), and inlaws.   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    FH- doesn't know father.  Mother died when he was [age] of domestic violence.  Does not know other FH. The patient does not have a history of early familial atrial fibrillation or other arrhythmias.  ROS- All systems are  reviewed and negative except as per the HPI above.  Physical Exam: Vitals:   05/11/17 1411  BP: (!) 142/94  Pulse: 69  Weight: 218 lb (98.9 kg)  Height: 5\' 10"  (1.778 m)    GEN- The patient is well appearing, alert and oriented x 3 today.   Head- normocephalic, atraumatic Eyes-  Sclera clear, conjunctiva pink Ears- hearing intact Oropharynx- clear Neck- supple  Lungs- Clear to ausculation bilaterally, normal work of breathing Heart- Regular rate and rhythm, no murmurs, rubs or gallops  GI- soft, NT, ND, + BS Extremities- no clubbing, cyanosis, or edema MS- no significant deformity or atrophy Skin- no rash or lesion Psych- euthymic mood, full affect Neuro- strength and sensation are intact  Wt Readings from Last 3 Encounters:  05/11/17 218 lb (98.9 kg)  05/04/17 215 lb (97.5 kg)  09/26/16 219 lb (99.3 kg)    EKG today demonstrates sinus rhythm Echo none  Epic records are reviewed at length today  Assessment and Plan:  1. New onset atrial fibrillation The patient has had new onset afib atrial fibrillation.  The patients CHAD2VASC score is 1.  he is not  appropriately anticoagulated at this time.  Antiarrhythmic therapy to dates has included none.  He has not had an echo A long discussion with the patient was had today regarding therapeutic strategies.  Extensive discussion of lifestyle modification including decrease or avoid alcohol intake was also discussed.  Presently, our recommendations include echo to evaluate for structural heart disease.  Sleep study.  Stop ASA and take eliquis 5mg  BID x 30 days post cardioversion  2. ETOH Reduction is encouraged  3. Snoring/ morning fatigue The patient snores.  He also startles awake at times with symptoms worrisome for apnea.  Will order sleep study  4. HTN Stable No change required today  Return in 6 weeks to see Vivi Fernsonna  [name], MD 05/11/2017 2:44 PM

## 2017-05-11 NOTE — Patient Instructions (Signed)
Stop aspirin  Start Eliquis 5mg  twice a day for 30 days then stop  Sleep study - scheduling will call you once they have talked with your insurance company

## 2017-05-12 ENCOUNTER — Telehealth: Payer: Self-pay | Admitting: *Deleted

## 2017-05-12 NOTE — Telephone Encounter (Signed)
-----   Message from Shona Simpson, RN sent at 05/11/2017  3:04 PM EDT ----- Regarding: sleep study Pt needs sleep study for afib. Thanks stacy

## 2017-05-12 NOTE — Telephone Encounter (Signed)
Sent to pre cert/ sleep pool 

## 2017-05-16 ENCOUNTER — Other Ambulatory Visit: Payer: Self-pay | Admitting: Cardiovascular Disease

## 2017-05-16 ENCOUNTER — Telehealth: Payer: Self-pay | Admitting: *Deleted

## 2017-05-16 DIAGNOSIS — I480 Paroxysmal atrial fibrillation: Secondary | ICD-10-CM

## 2017-05-16 NOTE — Telephone Encounter (Signed)
Patient notified of sleep study appointment. 

## 2017-05-16 NOTE — Telephone Encounter (Signed)
Patient returned a call to me with insurance company contact information needed.

## 2017-05-19 ENCOUNTER — Ambulatory Visit (HOSPITAL_COMMUNITY)
Admission: RE | Admit: 2017-05-19 | Discharge: 2017-05-19 | Disposition: A | Discharge status: Home / Self Care | Payer: BLUE CROSS/BLUE SHIELD | Source: Ambulatory Visit | Attending: Internal Medicine | Admitting: Internal Medicine

## 2017-05-19 DIAGNOSIS — I480 Paroxysmal atrial fibrillation: Secondary | ICD-10-CM | POA: Diagnosis not present

## 2017-05-19 DIAGNOSIS — I4891 Unspecified atrial fibrillation: Secondary | ICD-10-CM | POA: Diagnosis present

## 2017-05-19 DIAGNOSIS — I517 Cardiomegaly: Secondary | ICD-10-CM | POA: Diagnosis not present

## 2017-05-19 NOTE — Progress Notes (Signed)
  Echocardiogram 2D Echocardiogram has been performed.  Roosvelt MaserLane, Elis Sauber F 05/19/2017, 3:54 PM

## 2017-05-28 ENCOUNTER — Encounter (HOSPITAL_BASED_OUTPATIENT_CLINIC_OR_DEPARTMENT_OTHER): Payer: BLUE CROSS/BLUE SHIELD

## 2017-06-16 ENCOUNTER — Ambulatory Visit (HOSPITAL_BASED_OUTPATIENT_CLINIC_OR_DEPARTMENT_OTHER): Payer: BLUE CROSS/BLUE SHIELD | Attending: Cardiovascular Disease | Admitting: Cardiovascular Disease

## 2017-06-16 VITALS — Ht 71.0 in | Wt 211.0 lb

## 2017-06-16 DIAGNOSIS — R0902 Hypoxemia: Secondary | ICD-10-CM | POA: Insufficient documentation

## 2017-06-16 DIAGNOSIS — G4733 Obstructive sleep apnea (adult) (pediatric): Secondary | ICD-10-CM | POA: Diagnosis not present

## 2017-06-16 DIAGNOSIS — I480 Paroxysmal atrial fibrillation: Secondary | ICD-10-CM | POA: Insufficient documentation

## 2017-06-17 ENCOUNTER — Encounter (HOSPITAL_BASED_OUTPATIENT_CLINIC_OR_DEPARTMENT_OTHER): Payer: BLUE CROSS/BLUE SHIELD

## 2017-06-21 ENCOUNTER — Ambulatory Visit (HOSPITAL_COMMUNITY): Payer: BLUE CROSS/BLUE SHIELD | Admitting: Nurse Practitioner

## 2017-07-05 ENCOUNTER — Encounter (HOSPITAL_COMMUNITY): Payer: Self-pay | Admitting: Nurse Practitioner

## 2017-07-05 ENCOUNTER — Other Ambulatory Visit (HOSPITAL_COMMUNITY): Payer: Self-pay | Admitting: *Deleted

## 2017-07-05 ENCOUNTER — Ambulatory Visit (HOSPITAL_COMMUNITY)
Admission: RE | Admit: 2017-07-05 | Discharge: 2017-07-05 | Disposition: A | Discharge status: Home / Self Care | Payer: BLUE CROSS/BLUE SHIELD | Source: Ambulatory Visit | Attending: Nurse Practitioner | Admitting: Nurse Practitioner

## 2017-07-05 VITALS — BP 160/84 | HR 80 | Ht 71.0 in | Wt 213.0 lb

## 2017-07-05 DIAGNOSIS — I4891 Unspecified atrial fibrillation: Secondary | ICD-10-CM | POA: Insufficient documentation

## 2017-07-05 DIAGNOSIS — Z79899 Other long term (current) drug therapy: Secondary | ICD-10-CM | POA: Diagnosis not present

## 2017-07-05 DIAGNOSIS — I480 Paroxysmal atrial fibrillation: Secondary | ICD-10-CM | POA: Diagnosis not present

## 2017-07-05 DIAGNOSIS — I10 Essential (primary) hypertension: Secondary | ICD-10-CM | POA: Insufficient documentation

## 2017-07-05 NOTE — Progress Notes (Signed)
Primary Care Physician: Patient, No Pcp Per Referring Physician: [facility]     Devon Hanson is a 37 y.o. male with a h/o new onset afib that was successfully cardioverted inI the ER in March 2019. He was seen by Dr. Johney Frame in the afib clinic at that time.   Echo/sleep study was done. Echo reviewed with pt. He had sleep study 2 weeks ago but has not heard the results yet. Dr. Tresa Endo to read. He has BP problems and just stopped triamterene for cramping. Elevated today at 160/84. He has a f/u with PCP this week to further address.he finished 4 weeks of DOAC and is now off for a CHA2DS2VASc score of 1.  Today, he denies symptoms of palpitations, chest pain, shortness of breath, orthopnea, PND, lower extremity edema, dizziness, presyncope, syncope, or neurologic sequela. The patient is tolerating medications without difficulties and is otherwise without complaint today.   Past Medical History:  Diagnosis Date  . Hypertension   . Paroxysmal atrial fibrillation (HCC)    No past surgical history on file.  Current Outpatient Medications  Medication Sig Dispense Refill  . amLODipine (NORVASC) 10 MG tablet Take 1 tablet (10 mg total) by mouth daily. 30 tablet 0  . ibuprofen (ADVIL,MOTRIN) 200 MG tablet Take 400 mg by mouth daily as needed (PAIN).    Marland Kitchen metoprolol tartrate (LOPRESSOR) 25 MG tablet Take 1 tablet (25 mg total) by mouth 2 (two) times daily. 60 tablet 0   No current facility-administered medications for this encounter.     No Known Allergies  Social History   Socioeconomic History  . Marital status: Married    Spouse name: Not on file  . Number of children: Not on file  . Years of education: Not on file  . Highest education level: Not on file  Occupational History  . Not on file  Social Needs  . Financial resource strain: Not on file  . Food insecurity:    Worry: Not on file    Inability: Not on file  . Transportation needs:    Medical: Not on file    Non-medical: Not on  file  Tobacco Use  . Smoking status: Never Smoker  . Smokeless tobacco: Never Used  Substance and Sexual Activity  . Alcohol use: Yes    Comment: moderate liquor intake  . Drug use: Not on file  . Sexual activity: Not on file  Lifestyle  . Physical activity:    Days per week: Not on file    Minutes per session: Not on file  . Stress: Not on file  Relationships  . Social connections:    Talks on phone: Not on file    Gets together: Not on file    Attends religious service: Not on file    Active member of club or organization: Not on file    Attends meetings of clubs or organizations: Not on file    Relationship status: Not on file  . Intimate partner violence:    Fear of current or ex partner: Not on file    Emotionally abused: Not on file    Physically abused: Not on file    Forced sexual activity: Not on file  Other Topics Concern  . Not on file  Social History Narrative   Lives in [place] with wife, children (ages 5,8,4), and inlaws.   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    No family history on file.  ROS- All systems are reviewed and negative  except as per the HPI above  Physical Exam: Vitals:   07/05/17 1524  BP: (!) 160/84  Pulse: 80  Weight: 213 lb (96.6 kg)  Height:  (1.803 m)   Wt Readings from Last 3 Encounters:  07/05/17 213 lb (96.6 kg)  06/16/17 211 lb (95.7 kg)  05/11/17 218 lb (98.9 kg)    Labs: Lab Results  Component Value Date   NA 137 05/04/2017   K 3.3 (L) 05/04/2017   CL 103 05/04/2017   CO2 23 05/04/2017   GLUCOSE 119 (H) 05/04/2017   BUN 14 05/04/2017   CREATININE 1.07 05/04/2017   CALCIUM 9.7 05/04/2017   MG 1.8 05/04/2017   No results found for: INR No results found for: CHOL, HDL, LDLCALC, TRIG   GEN- The patient is well appearing, alert and oriented x 3 today.   Head- normocephalic, atraumatic Eyes-  Sclera clear, conjunctiva pink Ears- hearing intact Oropharynx- clear Neck- supple, no JVP Lymph- no  cervical lymphadenopathy Lungs- Clear to ausculation bilaterally, normal work of breathing Heart- Regular rate and rhythm, no murmurs, rubs or gallops, PMI not laterally displaced GI- soft, NT, ND, + BS Extremities- no clubbing, cyanosis, or edema MS- no significant deformity or atrophy Skin- no rash or lesion Psych- euthymic mood, full affect Neuro- strength and sensation are intact  EKG-NSR at 80 bpm, PR int at 174 ms, qrs int 78 ms, qtc 415 ms Echo-- Left ventricle: The cavity size was normal. There was mild focal   basal and mild concentric hypertrophy of the septum. Systolic   function was normal. The estimated ejection fraction was in the   range of 60% to 65%. Wall motion was normal; there were no   regional wall motion abnormalities. Features are consistent with   a pseudonormal left ventricular filling pattern, with concomitant   abnormal relaxation and increased filling pressure (grade 2   diastolic dysfunction). - Aortic valve: There was no regurgitation. - Mitral valve: There was trivial regurgitation. - Left atrium: The atrium was moderately dilated. - Right ventricle: The cavity size was normal. Wall thickness was   normal. Systolic function was normal. - Right atrium: The atrium was normal in size. - Tricuspid valve: There was no regurgitation. - Pulmonary arteries: Systolic pressure could not be accurately   estimated. - Inferior vena cava: The vessel was normal in size. - Pericardium, extracardiac: There was no pericardial effusion.     Assessment and Plan: 1. New onset afib No reoccurrence since successful cardioversion in ER, March 2019 General education re afib and triggers reviewed Off anticoagulation now for CHA2DS2VASc score of 1 Echo reviewed Sleep  Study results  pending  2. HTN  Not optimally controlled F/u with PCP for BP management   afib clinic as needed  Lupita Leash [facility] [address] [place], [place] [address] [phone]  May benefit from repeat Echo in 1-2 years

## 2017-07-07 ENCOUNTER — Encounter (HOSPITAL_BASED_OUTPATIENT_CLINIC_OR_DEPARTMENT_OTHER): Payer: Self-pay | Admitting: Cardiovascular Disease

## 2017-07-07 NOTE — Procedures (Signed)
Patient Name: Devon Hanson, Devon Hanson Date: 06/16/2017 Gender: Male D.O.B: 08-08-1980 Age (years): 89 Referring Provider: Nicki Guadalajara MD, ABSM Height (inches): 71 Interpreting Physician: Nicki Guadalajara MD, ABSM Weight (lbs): 211 RPSGT: Shelah Lewandowsky BMI: 29 MRN: [id number] Neck Size: 17.00  CLINICAL INFORMATION Sleep Study Type: NPSG  Indication for sleep study: Fatigue, Hypertension, Sleep walking/talking/parasomnias, Snoring, Witnessed Apneas  Epworth Sleepiness Score: 7  SLEEP STUDY TECHNIQUE As per the AASM Manual for the Scoring of Sleep and Associated Events v2.3 (April 2016) with a hypopnea requiring 4% desaturations.  The channels recorded and monitored were frontal, central and occipital EEG, electrooculogram (EOG), submentalis EMG (chin), nasal and oral airflow, thoracic and abdominal wall motion, anterior tibialis EMG, snore microphone, electrocardiogram, and pulse oximetry.  MEDICATIONS     amlodipine (NORVASC) 10 MG tablet             ibuprofen (ADVIL,MOTRIN) 200 MG tablet         metoprolol tartrate (LOPRESSOR) 25 MG tablet     ??????? Medications self-administered by patient taken the night of the study : N/A  SLEEP ARCHITECTURE The study was initiated at 10:04:44 PM and ended at 4:46:44 AM.  Sleep onset time was 11.1 minutes and the sleep efficiency was 82.8%%. The total sleep time was 332.9 minutes.  Stage REM latency was 117.0 minutes.  The patient spent 21.5%% of the night in stage N1 sleep, 61.1%% in stage N2 sleep, 0.0%% in stage N3 and 17.42% in REM.  Alpha intrusion was absent.  Supine sleep was 39.61%.  RESPIRATORY PARAMETERS The overall apnea/hypopnea index (AHI) was 5.6 per hour. The respiratory disturbance index (RDI) was 32.6 per hour. There were 4 total apneas, including 0 obstructive, 4 central and 0 mixed apneas. There were 27 hypopneas and 150 RERAs.  The AHI during Stage REM sleep was 10.3 per hour.  AHI while supine was  9.6 per hour.  The mean oxygen saturation was 95.2%. The minimum SpO2 during sleep was 89.0%.  Moderate snoring was noted during this study.  CARDIAC DATA The 2 lead EKG demonstrated sinus rhythm. The mean heart rate was 65.0 beats per minute. Other EKG findings include: PVCs.  LEG MOVEMENT DATA The total PLMS were 0 with a resulting PLMS index of 0.0. Associated arousal with leg movement index was 0.0.  IMPRESSIONS - Mild obstructive sleep apnea (AHI 5.6/h; although RDI 32.6/h).  Sleep apnea was worse slightly worse with supine and during REM sleep. - No significant central sleep apnea occurred during this study (CAI = 0.7/h). - Minimal oxygen desaturation to a nadir of 89%.  - The patient snored with moderate snoring volume. - EKG findings include PVCs. - Clinically significant periodic limb movements did not occur during sleep. No significant associated arousals.  DIAGNOSIS - Obstructive Sleep Apnea (327.23 [G47.33 ICD-10]) - Nocturnal Hypoxemia (327.26 [G47.36 ICD-10])  RECOMMENDATIONS - In this patient with significant cardiovascular comorbidities including atrial fibrillation and hypertension recommend CPAP titration study for treatment of his sleep-disordered breathing. Alternatives to CPAP can also be considered such as customized oral dental appliance. - Effort should be made to optimize nasal and oral pharyngeal patency. - Positional therapy avoiding supine position during sleep. - Avoid alcohol, sedatives and other CNS depressants that may worsen sleep apnea and disrupt normal sleep architecture. - Sleep hygiene should be reviewed to assess factors that may improve sleep quality. - Weight management and regular exercise should be initiated or continued if appropriate.  [Electronically signed] 07/07/2017 04:38 PM  Maisie Fus  Tresa Endo MD, [facility], ABSM Diplomate, American Board of Sleep Medicine   NPI: [id number] [facility] PH: [phone]   FX: [phone] ACCREDITED BY THE AMERICAN ACADEMY OF SLEEP MEDICINE

## 2017-10-18 ENCOUNTER — Encounter: Payer: Self-pay | Admitting: Gastroenterology

## 2017-11-29 ENCOUNTER — Encounter

## 2017-11-29 ENCOUNTER — Ambulatory Visit: Payer: BLUE CROSS/BLUE SHIELD | Admitting: Gastroenterology

## 2018-04-19 ENCOUNTER — Encounter (HOSPITAL_COMMUNITY): Payer: Self-pay | Admitting: Emergency Medicine

## 2018-04-19 ENCOUNTER — Ambulatory Visit (HOSPITAL_COMMUNITY)
Admission: EM | Admit: 2018-04-19 | Discharge: 2018-04-19 | Disposition: A | Discharge status: Home / Self Care | Payer: BLUE CROSS/BLUE SHIELD | Attending: Family Medicine | Admitting: Family Medicine

## 2018-04-19 DIAGNOSIS — M25511 Pain in right shoulder: Secondary | ICD-10-CM

## 2018-04-19 MED ORDER — DICLOFENAC SODIUM 75 MG PO TBEC: 75.0000 mg | DELAYED_RELEASE_TABLET | Freq: Two times a day (BID) | ORAL | 0 refills | Status: DC

## 2018-04-19 NOTE — ED Provider Notes (Signed)
[facility] CARE [facility]ENTER   [id number] 04/19/18 Arrival Time: 1213  ASSESSMENT & PLAN:  1. Acute pain of right shoulder    No indication for plain imaging of shoulder today. Discussed.  Trial of: Meds ordered this encounter  Medications  . diclofenac (VOLTAREN) 75 MG EC tablet    Sig: Take 1 tablet (75 mg total) by mouth 2 (two) times daily.    Dispense:  14 tablet    Refill:  0    Follow-up Information    Odem, Deeann Cree, FNP In 1 week.   Specialty:  Nurse Practitioner Why:  If not improving over the next week. Contact information: [address] [place] [address] [phone]          Encouraged ROM as he tolerates. Work note with light duty restrictions for one week provided.  Reviewed expectations re: course of current medical issues. Questions answered. Outlined signs and symptoms indicating need for more acute intervention. Patient verbalized understanding. After Visit Summary given.  SUBJECTIVE: History from: patient. Devon Hanson is a 38 y.o. male who reports fairly persistent mild to moderate pain of his right shoulder; described as aching without radiation. Onset: abrupt, about 4 days ago; more prominent over the past 48 hours. 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 Aggravating factors: certain movements of shoulder. Alleviating factors: rest. Associated symptoms: none reported. Extremity sensation changes or weakness: none. No neck pain. Self treatment: has not tried OTCs for relief of pain. History of similar: no.  ROS: As per HPI.   OBJECTIVE:  Vitals:   04/19/18 1328  BP: (!) 148/89  Pulse: 90  Resp: 16  Temp: 98.7 F (37.1 C)  SpO2: 100%    General appearance: alert; no distress Neck: FROM; supple; no midline tenderness Extremities: . RUE: muscular; warm and well perfused; poorly localized moderate tenderness over right anterior and posterior shoulder (more pain reported anteriorly);  without gross deformities; with no swelling; with no bruising; ROM: normal with reported discomfort, esp with internal rotation; no crepitus CV: brisk extremity capillary refill of RUE; 2+ radial pulse of RUE. Skin: warm and dry; no visible rashes Neurologic: gait normal; normal reflexes of RUE and LUE; normal sensation of RUE and LUE; normal strength of RUE and LUE Psychological: alert and cooperative; normal mood and affect  No Known Allergies  Past Medical History:  Diagnosis Date  . Hypertension   . Paroxysmal atrial fibrillation (HCC)    Social History   Socioeconomic History  . Marital status: Married    Spouse name: Not on file  . Number of children: Not on file  . Years of education: Not on file  . Highest education level: Not on file  Occupational History  . Not on file  Social Needs  . Financial resource strain: Not on file  . Food insecurity:    Worry: Not on file    Inability: Not on file  . Transportation needs:    Medical: Not on file    Non-medical: Not on file  Tobacco Use  . Smoking status: Never Smoker  . Smokeless tobacco: Never Used  Substance and Sexual Activity  . Alcohol use: Yes    Comment: moderate liquor intake  . Drug use: Not on file  . Sexual activity: Not on file  Lifestyle  . Physical activity:    Days per week: Not on file    Minutes per session: Not on file  . Stress: Not on file  Relationships  .  Social connections:    Talks on phone: Not on file    Gets together: Not on file    Attends religious service: Not on file    Active member of club or organization: Not on file    Attends meetings of clubs or organizations: Not on file    Relationship status: Not on file  Other Topics Concern  . Not on file  Social History Narrative   Lives in [place] with wife, children (ages 40,8,4), and inlaws.   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   No family history on file. History reviewed. No pertinent surgical history.    Mardella Layman, MD 04/19/18 [phone]

## 2018-04-19 NOTE — ED Triage Notes (Signed)
Pt c/o R shoulder pain since Sunday, getting worse. Denies injury.

## 2018-10-09 ENCOUNTER — Emergency Department (HOSPITAL_COMMUNITY)
Admission: EM | Admit: 2018-10-09 | Discharge: 2018-10-09 | Disposition: A | Discharge status: Home / Self Care | Payer: BC Managed Care – PPO | Attending: Emergency Medicine | Admitting: Emergency Medicine

## 2018-10-09 ENCOUNTER — Encounter (HOSPITAL_COMMUNITY): Payer: Self-pay | Admitting: Emergency Medicine

## 2018-10-09 ENCOUNTER — Other Ambulatory Visit: Payer: Self-pay

## 2018-10-09 ENCOUNTER — Emergency Department (HOSPITAL_COMMUNITY): Payer: BC Managed Care – PPO

## 2018-10-09 DIAGNOSIS — R002 Palpitations: Secondary | ICD-10-CM | POA: Insufficient documentation

## 2018-10-09 DIAGNOSIS — I10 Essential (primary) hypertension: Secondary | ICD-10-CM | POA: Insufficient documentation

## 2018-10-09 DIAGNOSIS — R000 Tachycardia, unspecified: Secondary | ICD-10-CM | POA: Diagnosis not present

## 2018-10-09 LAB — CBC
HCT: 41.9 % (ref 39.0–52.0)
Hemoglobin: 14.8 g/dL (ref 13.0–17.0)
MCH: 32.6 pg (ref 26.0–34.0)
MCHC: 35.3 g/dL (ref 30.0–36.0)
MCV: 92.3 fL (ref 80.0–100.0)
Platelets: 266 10*3/uL (ref 150–400)
RBC: 4.54 MIL/uL (ref 4.22–5.81)
RDW: 11.7 % (ref 11.5–15.5)
WBC: 4.7 10*3/uL (ref 4.0–10.5)
nRBC: 0 % (ref 0.0–0.2)

## 2018-10-09 LAB — BASIC METABOLIC PANEL
Anion gap: 13 (ref 5–15)
BUN: 7 mg/dL (ref 6–20)
CO2: 24 mmol/L (ref 22–32)
Calcium: 9.5 mg/dL (ref 8.9–10.3)
Chloride: 98 mmol/L (ref 98–111)
Creatinine, Ser: 1.06 mg/dL (ref 0.61–1.24)
GFR calc Af Amer: >60 mL/min (ref >60)
GFR calc non Af Amer: >60 mL/min (ref >60)
Glucose, Bld: 117 mg/dL — ABNORMAL HIGH (ref 70–99)
Potassium: 3 mmol/L — ABNORMAL LOW (ref 3.5–5.1)
Sodium: 135 mmol/L (ref 135–145)

## 2018-10-09 MED ORDER — SODIUM CHLORIDE 0.9 % IV BOLUS
1000.0000 mL | Freq: Once | INTRAVENOUS | Status: AC
  Administered 2018-10-09: 1000 mL via INTRAVENOUS

## 2018-10-09 MED ORDER — POTASSIUM CHLORIDE 10 MEQ/100ML IV SOLN
10.0000 meq | INTRAVENOUS | Status: DC
  Administered 2018-10-09 (×2): 10 meq via INTRAVENOUS
  Filled 2018-10-09 (×2): qty 100

## 2018-10-09 MED ORDER — SODIUM CHLORIDE 0.9% FLUSH
3.0000 mL | Freq: Once | INTRAVENOUS | Status: AC
  Administered 2018-10-09: 3 mL via INTRAVENOUS

## 2018-10-09 MED ORDER — POTASSIUM CHLORIDE CRYS ER 20 MEQ PO TBCR
40.0000 meq | EXTENDED_RELEASE_TABLET | Freq: Once | ORAL | Status: AC
  Administered 2018-10-09: 40 meq via ORAL
  Filled 2018-10-09: qty 2

## 2018-10-09 MED ORDER — DILTIAZEM HCL 25 MG/5ML IV SOLN
20.0000 mg | Freq: Once | INTRAVENOUS | Status: AC
  Administered 2018-10-09: 17:00:00 20 mg via INTRAVENOUS
  Filled 2018-10-09: qty 5

## 2018-10-09 NOTE — ED Notes (Signed)
Patient verbalizes understanding of discharge instructions. Opportunity for questioning and answers were provided. Armband removed by staff, pt discharged from ED.  

## 2018-10-09 NOTE — ED Provider Notes (Signed)
[facility] Emergency Department Provider Note MRN:  [id number]  Arrival date & time: 10/09/18     Chief Complaint   Palpitations and Atrial Fibrillation   History of Present Illness   Devon Hanson is a 38 y.o. year-old male with a history of hypertension, paroxysmal A. fib presenting to the ED with chief complaint of palpitations.  At midday today, patient began feeling palpitations in the left upper part of his chest.  Denies chest pain or shortness of breath.  Was associated with diaphoresis.  Sent here for concern for A. fib.  Denies recent illness, no cough, no fever, no abdominal pain, no numbness or weakness to the arms or legs.  Patient explains that he was at the beach last week and drink a large amount of alcohol.  Review of Systems  A complete 10 system review of systems was obtained and all systems are negative except as noted in the HPI and PMH.   Patient's Health History    Past Medical History:  Diagnosis Date  . Hypertension   . Paroxysmal atrial fibrillation ([place])     History reviewed. No pertinent surgical history.  No family history on file.  Social History   Socioeconomic History  . Marital status: Married    Spouse name: Not on file  . Number of children: Not on file  . Years of education: Not on file  . Highest education level: Not on file  Occupational History  . Not on file  Social Needs  . Financial resource strain: Not on file  . Food insecurity    Worry: Not on file    Inability: Not on file  . Transportation needs    Medical: Not on file    Non-medical: Not on file  Tobacco Use  . Smoking status: Never Smoker  . Smokeless tobacco: Never Used  Substance and Sexual Activity  . Alcohol use: Yes    Comment: moderate liquor intake  . Drug use: Not on file  . Sexual activity: Not on file  Lifestyle  . Physical activity    Days per week: Not on file    Minutes per session: Not on file  . Stress: Not on file   Relationships  . Social Herbalist on phone: Not on file    Gets together: Not on file    Attends religious service: Not on file    Active member of club or organization: Not on file    Attends meetings of clubs or organizations: Not on file    Relationship status: Not on file  . Intimate partner violence    Fear of current or ex partner: Not on file    Emotionally abused: Not on file    Physically abused: Not on file    Forced sexual activity: Not on file  Other Topics Concern  . Not on file  Social History Narrative   Lives in [place] with wife, children (ages 65,8,4), and inlaws.   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     Physical Exam  Vital Signs and Nursing Notes reviewed Vitals:   10/09/18 1700 10/09/18 1715  BP: (!) 139/98 127/84  Pulse: 93 (!) 101  Resp: (!) 24 (!) 23  Temp:    SpO2: 98% 97%    CONSTITUTIONAL: Well-appearing, NAD NEURO:  Alert and oriented x 3, no focal deficits EYES:  eyes equal and reactive ENT/NECK:  no LAD, no JVD CARDIO: Tachycardic rate, well-perfused, normal S1 and  S2 PULM:  CTAB no wheezing or rhonchi GI/GU:  normal bowel sounds, non-distended, non-tender MSK/SPINE:  No gross deformities, no edema SKIN:  no rash, atraumatic PSYCH:  Appropriate speech and behavior  Diagnostic and Interventional Summary    EKG Interpretation  Date/Time:  Monday October 09 2018 14:36:18 EDT Ventricular Rate:  120 PR Interval:    QRS Duration: 80 QT Interval:  294 QTC Calculation: 415 R Axis:   21 Text Interpretation:  Atrial flutter with 2:1 A-V conduction Septal infarct , age undetermined Abnormal ECG Confirmed by Kennis CarinaBero, Michael [phone]([id number]) on 10/09/2018 4:30:12 PM       EKG Interpretation  Date/Time:  Monday October 09 2018 18:17:26 EDT Ventricular Rate:  84 PR Interval:    QRS Duration: 84 QT Interval:  356 QTC Calculation: 421 R Axis:   31 Text Interpretation:  Sinus rhythm Borderline T abnormalities, inferior leads  Borderline ST elevation, lateral leads Confirmed by Kennis CarinaBero, Michael [phone]([id number]) on 10/09/2018 6:36:30 PM       Labs Reviewed  BASIC METABOLIC PANEL - Abnormal; Notable for the following components:      Result Value   Potassium 3.0 (*)    Glucose, Bld 117 (*)    All other components within normal limits  CBC    DG Chest 2 View  Final Result      Medications  potassium chloride 10 mEq in 100 mL IVPB (10 mEq Intravenous New Bag/Given 10/09/18 1815)  sodium chloride flush (NS) 0.9 % injection 3 mL (3 mLs Intravenous Given 10/09/18 1716)  sodium chloride 0.9 % bolus 1,000 mL (0 mLs Intravenous Stopped 10/09/18 1814)  potassium chloride SA (K-DUR) CR tablet 40 mEq (40 mEq Oral Given 10/09/18 1716)  diltiazem (CARDIZEM) injection 20 mg (20 mg Intravenous Given 10/09/18 1711)  sodium chloride 0.9 % bolus 1,000 mL (1,000 mLs Intravenous New Bag/Given 10/09/18 1710)     Procedures Critical Care Critical Care Documentation Critical care time provided by me (excluding procedures): 32 minutes  Condition necessitating critical care: Atrial flutter with rapid ventricular response  Components of critical care management: reviewing of prior records, laboratory and imaging interpretation, frequent re-examination and reassessment of vital signs, administration of IV fluids, IV diltiazem, IV potassium.    ED Course and Medical Decision Making  I have reviewed the triage vital signs and the nursing notes.  Pertinent labs & imaging results that were available during my care of the patient were reviewed by me and considered in my medical decision making (see below for details).  Question of sinus tachycardia versus atrial flutter in this 38 year old male here with tachycardia, recent binge drinking, question of holiday heart syndrome, hypokalemia could also be contributing to patient's arrhythmia.  Will provide IV fluids, diltiazem, IV potassium and reassess.  Patient had A. fib with rates in the 160s last  year that required cardioversion, only with rates of 110-120 today, will attempt to avoid cardioversion if possible.  Upon reassessment patient's heart rate is in the 80s, sinus rhythm confirmed on twelve-lead EKG.  He explains that his symptoms of palpitations have resolved.  Unclear if patient had sinus tachycardia versus atrial flutter.  Advised continued hydration at home and to restart his potassium supplementation.  Appropriate for discharge.  Elmer SowMichael M. Pilar PlateBero, MD [facility] [email or web address]  Final Clinical Impressions(s) / ED Diagnoses     ICD-10-CM   1. Palpitations  R00.2   2. Tachycardia  R00.0     ED Discharge Orders  None      Discharge Instructions Discussed with and Provided to Patient:   Discharge Instructions     You were evaluated in the Emergency Department and after careful evaluation, we did not find any emergent condition requiring admission or further testing in the hospital.  Your symptoms today seem to be due to dehydration and low potassium.  Please drink plenty of fluids at home and restart your potassium pills.  Please return to the Emergency Department if you experience any worsening of your condition.  We encourage you to follow up with a primary care provider.  Thank you for allowing us to be a part of your care.       Sabas SousBero, Michael M, MD 10/09/18 [phone]

## 2018-10-09 NOTE — ED Triage Notes (Signed)
Patient presents to the ED by EMS with c/o heart palpations while at work today. 350 NS in route PTA decreasing the HR. Pt has hx of A.fib. NAD at triage.

## 2018-10-09 NOTE — Discharge Instructions (Addendum)
You were evaluated in the Emergency Department and after careful evaluation, we did not find any emergent condition requiring admission or further testing in the hospital.  Your symptoms today seem to be due to dehydration and low potassium.  Please drink plenty of fluids at home and restart your potassium pills.  Please return to the Emergency Department if you experience any worsening of your condition.  We encourage you to follow up with a primary care provider.  Thank you for allowing [place] to be a part of your care.

## 2021-05-29 ENCOUNTER — Ambulatory Visit (INDEPENDENT_AMBULATORY_CARE_PROVIDER_SITE_OTHER): Payer: No Typology Code available for payment source

## 2021-05-29 ENCOUNTER — Encounter (HOSPITAL_COMMUNITY): Payer: Self-pay | Admitting: Nurse Practitioner

## 2021-05-29 ENCOUNTER — Ambulatory Visit (HOSPITAL_COMMUNITY)
Admission: EM | Admit: 2021-05-29 | Discharge: 2021-05-29 | Disposition: A | Discharge status: Home / Self Care | Payer: No Typology Code available for payment source | Attending: Nurse Practitioner | Admitting: Nurse Practitioner

## 2021-05-29 DIAGNOSIS — S46912A Strain of unspecified muscle, fascia and tendon at shoulder and upper arm level, left arm, initial encounter: Secondary | ICD-10-CM

## 2021-05-29 DIAGNOSIS — M25512 Pain in left shoulder: Secondary | ICD-10-CM

## 2021-05-29 MED ORDER — IBUPROFEN 800 MG PO TABS: 800.0000 mg | ORAL_TABLET | Freq: Three times a day (TID) | ORAL | 0 refills | Status: DC | PRN

## 2021-05-29 NOTE — ED Provider Notes (Signed)
?[facility] ? ? ? ?CSN: [id number] ?Arrival date & time: 05/29/21  0806 ? ? ?  ? ?History   ?Chief Complaint ?Chief Complaint  ?Patient presents with  ? Shoulder Injury  ? ? ?HPI ?Devon IraniHasan Hanson is a 41 y.o. male.  ? ?The patient is a 41 year old male who presents for left shoulder pain.  Symptoms have been present for the past 2 days.  Patient states he was playing with his son and thinks he landed on his shoulder.  He is really not sure of how the accident occurred as he states " happened so fast".  Since that time he has had pain in the front part of the left shoulder and difficulty moving the shoulder.  He also reports pain with movement.  He states the pain radiates into the left upper arm.  He denies numbness, tingling, swelling, bruising, neck pain.  He has not taken any medication or applied ice or heat to the shoulder. ? ?The history is provided by the patient.  ? ?Past Medical History:  ?Diagnosis Date  ? Hypertension   ? Paroxysmal atrial fibrillation (HCC)   ? ? ?There are no problems to display for this patient. ? ? ?History reviewed. No pertinent surgical history. ? ? ? ? ?Home Medications   ? ?Prior to Admission medications   ?Medication Sig Start Date End Date Taking? Authorizing Provider  ?amLODipine (NORVASC) 10 MG tablet Take 1 tablet (10 mg total) by mouth daily. 09/26/16  Yes Jacalyn LefevreHaviland, Julie, MD  ?ibuprofen (ADVIL) 800 MG tablet Take 1 tablet (800 mg total) by mouth every 8 (eight) hours as needed for moderate pain. 05/29/21  Yes Loi Rennaker-Warren, Sadie Haberhristie J, NP  ?dexlansoprazole (DEXILANT) 60 MG capsule Take by mouth. 10/31/17   [provider]  ?diclofenac (VOLTAREN) 75 MG EC tablet Take 1 tablet (75 mg total) by mouth 2 (two) times daily. 04/19/18   Mardella LaymanHagler, Brian, MD  ?metoprolol tartrate (LOPRESSOR) 25 MG tablet Take 1 tablet (25 mg total) by mouth 2 (two) times daily. 09/26/16   Jacalyn LefevreHaviland, Julie, MD  ?POTASSIUM PO Take by mouth.    [provider]  ? ? ?Family  History ?History reviewed. No pertinent family history. ? ?Social History ?Social History  ? ?Tobacco Use  ? Smoking status: Never  ? Smokeless tobacco: Never  ?Vaping Use  ? Vaping Use: Never used  ?Substance Use Topics  ? Alcohol use: Yes  ?  Comment: moderate liquor intake  ? ? ? ?Allergies   ?Patient has no known allergies. ? ? ?Review of Systems ?Review of Systems  ?Constitutional: Negative.   ?Musculoskeletal:  Negative for joint swelling and neck pain.  ?     Left shoulder pain  ?Skin: Negative.   ?Psychiatric/Behavioral: Negative.    ? ? ?Physical Exam ?Triage Vital Signs ?ED Triage Vitals  ?Enc Vitals Group  ?   BP 05/29/21 0824 (!) 178/104  ?   Pulse Rate 05/29/21 0822 (!) 105  ?   Resp 05/29/21 0822 16  ?   Temp 05/29/21 0822 97.8 ?F (36.6 ?C)  ?   Temp Source 05/29/21 0822 Oral  ?   SpO2 05/29/21 0822 98 %  ?   Weight --   ?   Height --   ?   Head Circumference --   ?   Peak Flow --   ?   Pain Score 05/29/21 0822 4  ?   Pain Loc --   ?   Pain Edu? --   ?  Excl. in GC? --   ? ?No data found. ? ?Updated Vital Signs ?BP (!) 178/104 (BP Location: Left Arm)   Pulse (!) 105   Temp 97.8 ?F (36.6 ?C) (Oral)   Resp 16   SpO2 98%  ? ?Visual Acuity ?Right Eye Distance:   ?Left Eye Distance:   ?Bilateral Distance:   ? ?Right Eye Near:   ?Left Eye Near:    ?Bilateral Near:    ? ?Physical Exam ?Vitals reviewed.  ?Constitutional:   ?   General: He is not in acute distress. ?   Appearance: Normal appearance.  ?HENT:  ?   Head: Normocephalic.  ?Eyes:  ?   Extraocular Movements: Extraocular movements intact.  ?   Pupils: Pupils are equal, round, and reactive to light.  ?Pulmonary:  ?   Effort: Pulmonary effort is normal.  ?   Breath sounds: Normal breath sounds.  ?Musculoskeletal:  ?   Left shoulder: Tenderness (anterior and lateral aspect of left shoulder) present. No swelling, deformity or bony tenderness. Decreased range of motion (pain with abduction). Normal strength. Normal pulse.  ?   Left upper arm: Normal.   ?   Cervical back: Normal range of motion. No rigidity or tenderness.  ?Skin: ?   General: Skin is warm and dry.  ?Neurological:  ?   Mental Status: He is alert.  ? ? ? ?UC Treatments / Results  ?Labs ?(all labs ordered are listed, but only abnormal results are displayed) ?Labs Reviewed - No data to display ? ?EKG ? ? ?Radiology ?DG Shoulder Left ? ?Result Date: 05/29/2021 ?CLINICAL DATA:  Left shoulder pain. EXAM: LEFT SHOULDER - 2+ VIEW COMPARISON:  Two-view chest x-ray 10/09/18 FINDINGS: There is no evidence of fracture or dislocation. There is no evidence of arthropathy or other focal bone abnormality. Soft tissues are unremarkable. IMPRESSION: Negative. Electronically Signed   By: Marin Roberts M.D.   On: 05/29/2021 09:20   ? ?Procedures ?Procedures (including critical care time) ? ?Medications Ordered in UC ?Medications - No data to display ? ?Initial Impression / Assessment and Plan / UC Course  ?I have reviewed the triage vital signs and the nursing notes. ? ?Pertinent labs & imaging results that were available during my care of the patient were reviewed by me and considered in my medical decision making (see chart for details). ? ?Patient is a 41 year old male who presents with left shoulder pain.  States he was playing with his son approximately 2 days ago and has since developed pain.  On exam, he does have tenderness to the anterior and lateral aspect of the left shoulder.  He also has pain with range of motion, which is worse with abduction.  X-rays were performed which were negative for dislocation or fracture.  Explained the same to the patient I am unable to see any tendon or ligamental injury on x-ray.  Advised the patient to start using ice to the affected area, on for 20 minutes, off for 1 hour, then repeat.  Also encourage patient to perform gentle range of motion's to keep the joint mobile.  Will prescribe the patient ibuprofen 800 mg to help with this pain.  Advised the patient to  follow-up with orthopedics if symptoms do not improve within the next 2 to 4 weeks.  We will provide the patient information for Ortho.  Follow-up as needed. ?Final Clinical Impressions(s) / UC Diagnoses  ? ?Final diagnoses:  ?Strain of left shoulder, initial encounter  ? ? ? ?Discharge Instructions   ? ?  ?  The x-rays of your shoulder were negative for fracture or dislocation.  We are unable to see any tendon or ligament injury on x-ray, you would need imaging such as an MRI. ?Take medication as prescribed.  Take medication with food and water. ?Apply ice to the affected area, on for 20 minutes, off for 1 hour, then repeat. ?Perform gentle range of motion exercises provided for you today. ?If your symptoms do not improve within the next 2 to 4 weeks, recommend following up with orthopedics.  You can follow-up with [facility] at [phone] or [facility] at [phone]. ?Follow-up as needed.  ? ? ? ? ?ED Prescriptions   ? ? Medication Sig Dispense Auth. Provider  ? ibuprofen (ADVIL) 800 MG tablet Take 1 tablet (800 mg total) by mouth every 8 (eight) hours as needed for moderate pain. 30 tablet Sherell Christoffel-Warren, Sadie Haber, NP  ? ?  ? ?PDMP not reviewed this encounter. ?  ?Abran Cantor, NP ?05/29/21 [phone] ? ?

## 2021-05-29 NOTE — ED Triage Notes (Signed)
C/o left shoulder pain after playing with his son  2 days ago.  ?

## 2021-05-29 NOTE — Discharge Instructions (Addendum)
The x-rays of your shoulder were negative for fracture or dislocation.  We are unable to see any tendon or ligament injury on x-ray, you would need imaging such as an MRI. ?Take medication as prescribed.  Take medication with food and water. ?Apply ice to the affected area, on for 20 minutes, off for 1 hour, then repeat. ?Perform gentle range of motion exercises provided for you today. ?If your symptoms do not improve within the next 2 to 4 weeks, recommend following up with orthopedics.  You can follow-up with [facility] at [phone] or [facility] at [phone]. ?Follow-up as needed.  ?

## 2021-06-18 ENCOUNTER — Ambulatory Visit (INDEPENDENT_AMBULATORY_CARE_PROVIDER_SITE_OTHER): Payer: No Typology Code available for payment source | Admitting: Orthopedic Surgery

## 2021-06-18 DIAGNOSIS — M7542 Impingement syndrome of left shoulder: Secondary | ICD-10-CM | POA: Diagnosis not present

## 2021-06-23 ENCOUNTER — Encounter: Payer: Self-pay | Admitting: Orthopedic Surgery

## 2021-06-23 DIAGNOSIS — M7542 Impingement syndrome of left shoulder: Secondary | ICD-10-CM | POA: Diagnosis not present

## 2021-06-23 MED ORDER — LIDOCAINE HCL 1 % IJ SOLN
5.0000 mL | INTRAMUSCULAR | Status: AC | PRN
  Administered 2021-06-23: 5 mL

## 2021-06-23 MED ORDER — METHYLPREDNISOLONE ACETATE 40 MG/ML IJ SUSP
40.0000 mg | INTRAMUSCULAR | Status: AC | PRN
  Administered 2021-06-23: 40 mg via INTRA_ARTICULAR

## 2021-06-23 NOTE — Progress Notes (Signed)
? ?Office Visit Note ?  ?Patient: Devon Hanson           ?Date of Birth: Feb 15, 1981           ?MRN: [id number] ?Visit Date: 06/18/2021 ?             ?Requested by: Zachery Dauer, FNP ?[address] ?[place],  [place] [address] ?PCP: Zachery Dauer, FNP ? ?Chief Complaint  ?Patient presents with  ? Left Shoulder - Pain  ? ? ? ? ?HPI: ?Patient is a 41 year old gentleman who is seen for initial evaluation for left shoulder pain.  Patient states he injured it when he was playing with his son and fell and landed on the shoulder.  Patient has pain with activities with overhead activities has used ibuprofen without relief.  Symptoms for about 3 weeks. ? ?Assessment & Plan: ?Visit Diagnoses:  ?1. Impingement syndrome of left shoulder   ? ? ?Plan: Patient was injected in the subacromial space he tolerated this well. ? ?Follow-Up Instructions: Return in about 4 weeks (around 07/16/2021).  ? ?Ortho Exam ? ?Patient is alert, oriented, no adenopathy, well-dressed, normal affect, normal respiratory effort. ?Examination patient radiographs are reviewed which shows no bony abnormalities.  Patient has abduction flexion to 120 degrees he has pain with Neer and Hawkins impingement test.  The [facility] joint is not tender to palpation the biceps tendon is not tender to palpation. ? ?Imaging: ?No results found. ?No images are attached to the encounter. ? ?Labs: ?No results found for: HGBA1C, ESRSEDRATE, CRP, LABURIC, REPTSTATUS, GRAMSTAIN, CULT, LABORGA ? ? ?No results found for: ALBUMIN, PREALBUMIN, CBC ? ?Lab Results  ?Component Value Date  ? MG 1.8 05/04/2017  ? ?No results found for: VD25OH ? ?No results found for: PREALBUMIN ? ?  Latest Ref Rng & Units 10/09/2018  ?  2:47 PM 05/04/2017  ?  2:42 PM 09/26/2016  ?  5:45 PM  ?CBC EXTENDED  ?WBC 4.0 - 10.5 K/uL 4.7   5.3   5.5    ?RBC 4.22 - 5.81 MIL/uL 4.54   4.76   4.71    ?Hemoglobin 13.0 - 17.0 g/dL 93.2   67.1   24.5    ?HCT 39.0 - 52.0 % 41.9   43.6   41.4    ?Platelets 150 - 400  K/uL 266   294   287    ? ? ? ?There is no height or weight on file to calculate BMI. ? ?Orders:  ?No orders of the defined types were placed in this encounter. ? ?No orders of the defined types were placed in this encounter. ? ? ? Procedures: ?Large Joint Inj: L subacromial bursa on 06/23/2021 1:01 PM ?Indications: diagnostic evaluation and pain ?Details: 22 G 1.5 in needle, posterior approach ? ?Arthrogram: No ? ?Medications: 5 mL lidocaine 1 %; 40 mg methylPREDNISolone acetate 40 MG/ML ?Outcome: tolerated well, no immediate complications ?Procedure, treatment alternatives, risks and benefits explained, specific risks discussed. Consent was given by the patient. Immediately prior to procedure a time out was called to verify the correct patient, procedure, equipment, support staff and site/side marked as required. Patient was prepped and draped in the usual sterile fashion.  ? ? ? ?Clinical Data: ?No additional findings. ? ?ROS: ? ?All other systems negative, except as noted in the HPI. ?Review of Systems ? ?Objective: ?Vital Signs: There were no vitals taken for this visit. ? ?Specialty Comments:  ?No specialty comments available. ? ?PMFS History: ?There are  no problems to display for this patient. ? ?Past Medical History:  ?Diagnosis Date  ? Hypertension   ? Paroxysmal atrial fibrillation (HCC)   ?  ?History reviewed. No pertinent family history.  ?History reviewed. No pertinent surgical history. ?Social History  ? ?Occupational History  ? Not on file  ?Tobacco Use  ? Smoking status: Never  ? Smokeless tobacco: Never  ?Vaping Use  ? Vaping Use: Never used  ?Substance and Sexual Activity  ? Alcohol use: Yes  ?  Comment: moderate liquor intake  ? Drug use: Not on file  ? Sexual activity: Not on file  ? ? ? ? ? ?

## 2021-07-03 ENCOUNTER — Telehealth: Payer: Self-pay | Admitting: Orthopedic Surgery

## 2021-07-03 NOTE — Telephone Encounter (Signed)
Pt called requesting a referral be sent for left shoulder MRI. Pt states injection did not help. Pt phone number is [phone]

## 2021-07-06 ENCOUNTER — Other Ambulatory Visit: Payer: Self-pay

## 2021-07-06 DIAGNOSIS — M7542 Impingement syndrome of left shoulder: Secondary | ICD-10-CM

## 2021-07-06 NOTE — Telephone Encounter (Signed)
I called pt to advise that order in chart for MRI and scheduling will call once auth has been obtained from insurance.

## 2021-07-06 NOTE — Telephone Encounter (Signed)
This pt has a shoulder injection on 06/18/2021 he states that it did not help his pain and he is wanting to proceed with MRI of the shoulder. Is this ok to order?

## 2021-07-07 ENCOUNTER — Other Ambulatory Visit: Payer: Self-pay | Admitting: Orthopedic Surgery

## 2021-07-07 DIAGNOSIS — M25512 Pain in left shoulder: Secondary | ICD-10-CM

## 2021-07-20 ENCOUNTER — Ambulatory Visit: Payer: No Typology Code available for payment source | Admitting: Orthopedic Surgery

## 2021-07-21 ENCOUNTER — Ambulatory Visit
Admission: RE | Admit: 2021-07-21 | Discharge: 2021-07-21 | Disposition: A | Discharge status: Home / Self Care | Payer: No Typology Code available for payment source | Source: Ambulatory Visit | Attending: Orthopedic Surgery | Admitting: Orthopedic Surgery

## 2021-07-21 DIAGNOSIS — M25512 Pain in left shoulder: Secondary | ICD-10-CM

## 2021-07-21 DIAGNOSIS — M7542 Impingement syndrome of left shoulder: Secondary | ICD-10-CM

## 2021-07-21 DIAGNOSIS — M25612 Stiffness of left shoulder, not elsewhere classified: Secondary | ICD-10-CM | POA: Diagnosis not present

## 2021-07-21 DIAGNOSIS — S43432A Superior glenoid labrum lesion of left shoulder, initial encounter: Secondary | ICD-10-CM | POA: Diagnosis not present

## 2021-07-21 MED ORDER — IOPAMIDOL (ISOVUE-M 200) INJECTION 41%
13.0000 mL | Freq: Once | INTRAMUSCULAR | Status: AC
  Administered 2021-07-21: 13 mL via INTRA_ARTICULAR

## 2021-07-24 ENCOUNTER — Telehealth: Payer: Self-pay

## 2021-07-24 NOTE — Telephone Encounter (Signed)
Can you please get patient scheduled next week with Franky Macho?

## 2021-07-24 NOTE — Telephone Encounter (Signed)
-----   Message from Pamella Pert, [place] sent at 07/23/2021  3:33 PM EDT ----- Regarding: surgical referral Dr. Sharol Given would like to refer this pt to Dr. Marlou Sa for possible surgical intervention but his first available is not until 08/12/21. Can he be seen sooner?  ----- Message ----- From: Newt Minion, MD Sent: 07/23/2021   1:15 PM EDT To: Pamella Pert, RMA  Lets have him follow-up with Dr. Marlou Sa to see the surgical intervention would be helpful. ----- Message ----- From: Interface, Rad Results In Sent: 07/22/2021  12:09 PM EDT To: Newt Minion, MD

## 2021-07-28 ENCOUNTER — Ambulatory Visit (INDEPENDENT_AMBULATORY_CARE_PROVIDER_SITE_OTHER): Payer: 59 | Admitting: Surgical

## 2021-07-28 ENCOUNTER — Ambulatory Visit: Payer: No Typology Code available for payment source | Admitting: Orthopedic Surgery

## 2021-07-28 DIAGNOSIS — S43002A Unspecified subluxation of left shoulder joint, initial encounter: Secondary | ICD-10-CM | POA: Diagnosis not present

## 2021-07-28 DIAGNOSIS — M25312 Other instability, left shoulder: Secondary | ICD-10-CM | POA: Diagnosis not present

## 2021-07-31 ENCOUNTER — Encounter: Payer: Self-pay | Admitting: Surgical

## 2021-07-31 NOTE — Progress Notes (Signed)
Office Visit Note   Patient: Devon Hanson           Date of Birth: 05/21/1980           MRN: [id number] Visit Date: 07/28/2021 Requested by: Zachery Dauer, FNP [address] [place],  [place] [address] PCP: Zachery Dauer, FNP  Subjective: Chief Complaint  Patient presents with   Left Shoulder - Pain    HPI: Devon Hanson is a 41 y.o. male who presents to the office for MRI review. Patient denies any changes in symptoms.  Continues to complain mainly of left shoulder.  Patient states that he has had pain for 2 months since he was playing with his son.  He brought his arm back behind him very quickly and forcefully in a joking gesture and noticed increased pain after that event.  Localizes pain to the anterior aspect of the shoulder and feels like his shoulder is subluxing at times.  No actual shoulder dislocation or instability that he has a history of.  Pain radiates down the anterior humerus to the elbow.  No history of prior surgery to the left shoulder.  He would like to return to weightlifting but this symptoms of pain and instability is getting in the way of that.  Pain wakes him up at night.  He is left-hand dominant.  Using his sling as needed.  Had an injection in the subacromial space by Dr. Lajoyce Corners without any relief.  He works as a Financial risk analyst.  No history of diabetes or smoking.  Does have a history of hypertension.  MRI results revealed: MR Shoulder Left w/ contrast  Result Date: 07/22/2021 CLINICAL DATA:  Left shoulder impingement. Decreased range of motion. EXAM: MRI OF THE LEFT SHOULDER WITH CONTRAST TECHNIQUE: Multiplanar, multisequence MR imaging of the left shoulder was performed following the administration of intra-articular contrast. CONTRAST:  See Injection Documentation. COMPARISON:  Left shoulder radiographs 05/29/2021 FINDINGS: Rotator cuff: Minimal intermediate T2 signal supraspinatus and infraspinatus tendinosis. Mild marrow edema within the humeral head  greater tuberosity deep to the posterior supraspinatus tendon footprint. The subscapularis and teres minor are intact. Muscles: No rotator cuff muscle atrophy, fatty infiltration, or edema. Biceps long head: The intra-articular long head of the biceps tendon is intact. Acromioclavicular Joint: There are mild degenerative changes of the acromioclavicular joint including joint space narrowing, subchondral marrow edema, and peripheral osteophytosis. Type II acromion. No subacromial/subdeltoid bursitis. Glenohumeral Joint: Intact cartilage. Labrum: There is abnormal linear extension of gadolinium contrast into the posteroinferior 5 o'clock through the posterosuperior 2 o'clock locations (axial series 3 images 7 through 12, a nondisplaced tear. This tear may also continue through the chondrolabral junction of the more superior glenoid labrum (coronal series 7 images 8 through 10). There is fluid undercutting the anterior superior glenoid labrum (axial series 3 images 6 through 9) that may represent a normal variant sublabral foramen versus continuation of the labral tear. Bones:  No acute fracture.  No Hill-Sachs lesion. Other: None. IMPRESSION: 1. No rotator cuff tear. 2. Mild degenerative changes of the acromioclavicular joint. 3. Extensive nondisplaced posteroinferior through posterosuperior glenoid labral tear. This tear also appears to extend into the more superior glenoid labrum. Within the anterior superior glenoid labrum there is fluid undercutting the anterosuperior labrum that may represent a normal variant sublabral foramen versus further continuation of the tear. Electronically Signed   By: Neita Garnet M.D.   On: 07/22/2021 12:06  ROS: All systems reviewed are negative as they relate to the chief complaint within the history of present illness.  Patient denies fevers or chills.  Assessment & Plan: Visit Diagnoses:  1. Acquired subluxation of left shoulder, initial encounter   2.  Instability of left shoulder joint     Plan: [name] is a 41 y.o. male who presents to the office for review of MRI of the left shoulder.  He has left shoulder injury when he brought his arm back behind him forcefully in a joking manner.  Did not actually dislocate the shoulder at that time and has not had any true instability dislocation events but does feel like his shoulder continually subluxes posteriorly.  He has associated pain in the area of the bicep tendon with reproduced pain with supination and bicep flexion actively.  There is reproducible mechanical clicking on exam with manipulation of the shoulder joint.  He has positive apprehension sign.  MRI was reviewed today that demonstrated nondisplaced posterior inferior through posterior superior labral tear that extends into the superior aspect of the labrum and possibly into the anterior superior aspect of the labrum as well.  This would explain his mechanical symptoms and subluxation sensation as well as the increased bicep tendon pain that he has by his history and exam.  Discussed the options available to patient.  With the mechanical nature of his symptoms and the extensive nature of the tear, think that he would do best with proceeding with arthroscopic labral repair rather than try an intra-articular glenohumeral injection first.  Patient would like a definitive management of his shoulder problem and would like to proceed with surgery.  Discussed the risk and benefits of the procedure including but not limited to the risk of nerve/vessel damage, shoulder stiffness, continued shoulder instability, need for revision surgery in the future, infection, medical complication from surgery.  After lengthy discussion of the surgical process, recovery timeframe, risk/benefits, patient would like to proceed with procedure.  He will be posted for procedure and follow-up afterward.  Follow-Up Instructions: No follow-ups on file.   Orders:  No  orders of the defined types were placed in this encounter.  No orders of the defined types were placed in this encounter.     Procedures: No procedures performed   Clinical Data: No additional findings.  Objective: Vital Signs: There were no vitals taken for this visit.  Physical Exam:  Constitutional: Patient appears well-developed HEENT:  Head: Normocephalic Eyes:EOM are normal Neck: Normal range of motion Cardiovascular: Normal rate Pulmonary/chest: Effort normal Neurologic: Patient is alert Skin: Skin is warm Psychiatric: Patient has normal mood and affect  Ortho Exam: Ortho exam demonstrates left shoulder with 60 degrees external rotation, 110 degrees abduction, 170 degrees forward flexion.  This compared with the right shoulder with 70 degrees X rotation, 120 degrees abduction, 180 degrees forward flexion.  He has moderate tenderness over the bicipital groove and positive O'Brien sign.  No tenderness over the [facility] joint.  He has a clicking sensation with passive motion of the shoulder that is felt in the posterior inferior aspect of the shoulder.  No coarseness or grinding in the area of the rotator cuff.  He has positive apprehension sign.  Negative relocation sign.  5/5 motor strength of bilateral grip strength, EPL, FPL, finger abduction, pronation/supination, bicep, tricep, deltoid.  Axillary nerve is intact with deltoid firing.  Does have increased pain with supination and bicep flexion.  Excellent rotator cuff strength of supra, infra, subscap.  Specialty  Comments:  No specialty comments available.  Imaging: No results found.   PMFS History: There are no problems to display for this patient.  Past Medical History:  Diagnosis Date   Hypertension    Paroxysmal atrial fibrillation (HCC)     No family history on file.  No past surgical history on file. Social History   Occupational History   Not on file  Tobacco Use   Smoking status: Never   Smokeless  tobacco: Never  Vaping Use   Vaping Use: Never used  Substance and Sexual Activity   Alcohol use: Yes    Comment: moderate liquor intake   Drug use: Not on file   Sexual activity: Not on file

## 2021-08-19 ENCOUNTER — Encounter (HOSPITAL_COMMUNITY): Payer: Self-pay | Admitting: Emergency Medicine

## 2021-08-19 ENCOUNTER — Emergency Department (HOSPITAL_COMMUNITY)
Admission: EM | Admit: 2021-08-19 | Discharge: 2021-08-19 | Disposition: A | Discharge status: Home / Self Care | Payer: 59 | Attending: Emergency Medicine | Admitting: Emergency Medicine

## 2021-08-19 ENCOUNTER — Other Ambulatory Visit: Payer: Self-pay

## 2021-08-19 DIAGNOSIS — R454 Irritability and anger: Secondary | ICD-10-CM | POA: Insufficient documentation

## 2021-08-19 DIAGNOSIS — R69 Illness, unspecified: Secondary | ICD-10-CM | POA: Diagnosis not present

## 2021-08-19 NOTE — ED Provider Notes (Signed)
[facility]-EMERGENCY DEPT [facility] Emergency Department Provider Note MRN:  [id number]  Arrival date & time: 08/19/21     Chief Complaint   anger issues   History of Present Illness   Devon Hanson is a 41 y.o. year-old male presents to the ED with chief complaint of anger issues.  He states that something triggered him earlier today and he was very Survey[employer], quantity.  He says that he called 911 because he was so angry.  He denies wanting to hurt anyone.  States he's had some passive suicidal thoughts, but denies feeling suicidal now.  He states that he talked with several of his friends while he has been waiting in the ER and now feels better.  He would like to follow-up outpatient and is requesting discharge.  History provided by patient.   Review of Systems  Pertinent review of systems noted in HPI.    Physical Exam   Vitals:   08/19/21 2045 08/19/21 2249  BP: (!) 153/114 (!) 134/96  Pulse: (!) 118 (!) 108  Resp: 16 16  Temp: 98.6 F (37 C) 99.1 F (37.3 C)  SpO2: 91% 93%    CONSTITUTIONAL:  well-appearing, NAD NEURO:  Alert and oriented x 3, CN 3-12 grossly intact EYES:  eyes equal and reactive ENT/NECK:  Supple, no stridor  CARDIO:  tachycardic, regular rhythm, appears well-perfused  PULM:  No respiratory distress, CTAB GI/GU:  non-distended,  MSK/SPINE:  No gross deformities, no edema, moves all extremities  SKIN:  no rash, atraumatic   *Additional and/or pertinent findings included in MDM below  Diagnostic and Interventional Summary    EKG Interpretation  Date/Time:    Ventricular Rate:    PR Interval:    QRS Duration:   QT Interval:    QTC Calculation:   R Axis:     Text Interpretation:         Labs Reviewed - No data to display  No orders to display    Medications - No data to display   Procedures  /  Critical Care Procedures  ED Course and Medical Decision Making  I have reviewed the triage vital signs, the nursing notes, and pertinent  available records from the EMR.  Social Determinants Affecting Complexity of Care: Patient has no clinically significant social determinants affecting this chief complaint..   ED Course:   Patient here with anger management problem. Medical Decision Making Patient here with anger issues.  He was able to talk with several of his friends while waiting for room, now feels calm down.  He denies homicidal or active suicidal thoughts during my history.  He states that he would like to follow-up on an outpatient basis.  He seems calm and does not seem to be a danger to himself or others.  Will discharge patient home with outpatient follow-up.  Problems Addressed: Anger reaction: acute illness or injury  Risk Decision regarding hospitalization.     Consultants: No consultations were needed in caring for this patient.   Treatment and Plan: Emergency department workup does not suggest an emergent condition requiring admission or immediate intervention beyond  what has been performed at this time. The patient is safe for discharge and has  been instructed to return immediately for worsening symptoms, change in  symptoms or any other concerns    Final Clinical Impressions(s) / ED Diagnoses     ICD-10-CM   1. Anger reaction  R45.4       ED Discharge Orders     None  Discharge Instructions Discussed with and Provided to Patient:   Discharge Instructions   None      Roxy Horseman, Cordelia Poche 08/19/21 2308    Jacalyn Lefevre, MD 08/20/21 [phone]

## 2021-08-19 NOTE — Telephone Encounter (Signed)
Devon Hanson, Devon Hanson is okay for light duty note.  Just make it for 2 months since not sure when Eunice Blase can work him in onto our schedule.  Also attaching Eunice Blase just so she is aware that he is trying to reach her.  Thank you guys

## 2021-08-19 NOTE — ED Triage Notes (Signed)
Pt was very angry and called [employer] stating that he needed to get out of the house and talk to "someone."  [employer] states pt never threatened or was aggressive with them.  Pt admits to being very angry but denies threatening/hurting anyone.  Denies SI/HI.  ETOH involved

## 2021-08-19 NOTE — ED Notes (Signed)
Patient verbalizes understanding of d/c instructions. Opportunities for questions and answers were provided. Pt d/c from ED and ambulated to lobby where friend is picking pt up. Pt was given belongings back

## 2021-08-21 NOTE — Telephone Encounter (Signed)
Thanks debbie

## 2021-08-26 ENCOUNTER — Encounter: Payer: 59 | Admitting: Orthopedic Surgery

## 2021-08-31 NOTE — Progress Notes (Signed)
Surgical Instructions    Your procedure is scheduled on Tuesday, July 25th, 2023.   Report to [facility] Main Entrance "A" at 05:30 A.M., then check in with the Admitting office.  Call this number if you have problems the morning of surgery:  [phone]   If you have any questions prior to your surgery date call [phone]: Open Monday-Friday 8am-4pm    Remember:  Do not eat after midnight the night before your surgery  You may drink clear liquids until 04:30 the morning of your surgery.   Clear liquids allowed are: Water, Non-Citrus Juices (without pulp), Carbonated Beverages, Clear Tea, Black Coffee ONLY (NO MILK, CREAM OR POWDERED CREAMER of any kind), and Gatorade  Patient Instructions  The night before surgery:  No food after midnight. ONLY clear liquids after midnight  The day of surgery (if you do NOT have diabetes):  Drink ONE (1) Pre-Surgery Clear Ensure by 04:30 the morning of surgery. Drink in one sitting. Do not sip.  This drink was given to you during your hospital  pre-op appointment visit.  Nothing else to drink after completing the  Pre-Surgery Clear Ensure.         If you have questions, please contact your surgeon's office.     Take these medicines the morning of surgery with A SIP OF WATER:   amLODipine (NORVASC) dexlansoprazole (DEXILANT) metoprolol tartrate (LOPRESSOR)    As of today, STOP taking any Aspirin (unless otherwise instructed by your surgeon) Aleve, Naproxen, Ibuprofen, Motrin, Advil, Goody's, BC's, all herbal medications, fish oil, and all vitamins.    The day of surgery:          Do not wear jewelry Do not wear lotions, powders, colognes, or deodorant. Men may shave face and neck. Do not bring valuables to the hospital.   [facility] is not responsible for any belongings or valuables. .   Do NOT Smoke (Tobacco/Vaping)  24 hours prior to your procedure  If you use a CPAP at night, you may bring your mask for your overnight  stay.   Contacts, glasses, hearing aids, dentures or partials may not be worn into surgery, please bring cases for these belongings   For patients admitted to the hospital, discharge time will be determined by your treatment team.   Patients discharged the day of surgery will not be allowed to drive home, and someone needs to stay with them for 24 hours.   SURGICAL WAITING ROOM VISITATION Patients having surgery or a procedure may have no more than 2 support people in the waiting area - these visitors may rotate.   Children under the age of 47 must have an adult with them who is not the patient. If the patient needs to stay at the hospital during part of their recovery, the visitor guidelines for inpatient rooms apply. Pre-op nurse will coordinate an appropriate time for 1 support person to accompany patient in pre-op.  This support person may not rotate.   Please refer to the [facility] website for the visitor guidelines for Inpatients (after your surgery is over and you are in a regular room).    Special instructions:    Oral Hygiene is also important to reduce your risk of infection.  Remember - BRUSH YOUR TEETH THE MORNING OF SURGERY WITH YOUR REGULAR TOOTHPASTE   [place]- Preparing For Surgery  Before surgery, you can play an important role. Because skin is not sterile, your skin needs to be as free of germs as possible.  You can reduce the number of germs on your skin by washing with CHG (chlorahexidine gluconate) Soap before surgery.  CHG is an antiseptic cleaner which kills germs and bonds with the skin to continue killing germs even after washing.     Please do not use if you have an allergy to CHG or antibacterial soaps. If your skin becomes reddened/irritated stop using the CHG.  Do not shave (including legs and underarms) for at least 48 hours prior to first CHG shower. It is OK to shave your face.  Please follow these instructions carefully.     Shower the NIGHT  BEFORE SURGERY and the MORNING OF SURGERY with CHG Soap.   If you chose to wash your hair, wash your hair first as usual with your normal shampoo. After you shampoo, rinse your hair and body thoroughly to remove the shampoo.  Then [employer] and genitals (private parts) with your normal soap and rinse thoroughly to remove soap.  After that Use CHG Soap as you would any other liquid soap. You can apply CHG directly to the skin and wash gently with a scrungie or a clean washcloth.   Apply the CHG Soap to your body ONLY FROM THE NECK DOWN.  Do not use on open wounds or open sores. Avoid contact with your eyes, ears, mouth and genitals (private parts). Wash Face and genitals (private parts)  with your normal soap.   Wash thoroughly, paying special attention to the area where your surgery will be performed.  Thoroughly rinse your body with warm water from the neck down.  DO NOT shower/wash with your normal soap after using and rinsing off the CHG Soap.  Pat yourself dry with a CLEAN TOWEL.  Wear CLEAN PAJAMAS to bed the night before surgery  Place CLEAN SHEETS on your bed the night before your surgery  DO NOT SLEEP WITH PETS.   Day of Surgery:  Take a shower with CHG soap. Wear Clean/Comfortable clothing the morning of surgery Do not apply any deodorants/lotions.   Remember to brush your teeth WITH YOUR REGULAR TOOTHPASTE.    If you received a COVID test during your pre-op visit, it is requested that you wear a mask when out in public, stay away from anyone that may not be feeling well, and notify your surgeon if you develop symptoms. If you have been in contact with anyone that has tested positive in the last 10 days, please notify your surgeon.    Please read over the following fact sheets that you were given.

## 2021-09-01 ENCOUNTER — Other Ambulatory Visit: Payer: Self-pay

## 2021-09-01 ENCOUNTER — Encounter (HOSPITAL_COMMUNITY): Payer: Self-pay

## 2021-09-01 ENCOUNTER — Encounter (HOSPITAL_COMMUNITY)
Admission: RE | Admit: 2021-09-01 | Discharge: 2021-09-01 | Disposition: A | Discharge status: Home / Self Care | Payer: 59 | Source: Ambulatory Visit | Attending: Orthopedic Surgery | Admitting: Orthopedic Surgery

## 2021-09-01 VITALS — BP 140/100 | HR 107 | Temp 98.7°F | Resp 17 | Ht 68.0 in | Wt 211.1 lb

## 2021-09-01 DIAGNOSIS — Z01818 Encounter for other preprocedural examination: Secondary | ICD-10-CM | POA: Diagnosis present

## 2021-09-01 DIAGNOSIS — I480 Paroxysmal atrial fibrillation: Secondary | ICD-10-CM | POA: Insufficient documentation

## 2021-09-01 DIAGNOSIS — S43492A Other sprain of left shoulder joint, initial encounter: Secondary | ICD-10-CM | POA: Diagnosis not present

## 2021-09-01 DIAGNOSIS — Z7901 Long term (current) use of anticoagulants: Secondary | ICD-10-CM | POA: Diagnosis not present

## 2021-09-01 DIAGNOSIS — I10 Essential (primary) hypertension: Secondary | ICD-10-CM | POA: Diagnosis not present

## 2021-09-01 DIAGNOSIS — X58XXXA Exposure to other specified factors, initial encounter: Secondary | ICD-10-CM | POA: Insufficient documentation

## 2021-09-01 HISTORY — DX: Anxiety disorder, unspecified: F41.9

## 2021-09-01 HISTORY — DX: Gastro-esophageal reflux disease without esophagitis: K21.9

## 2021-09-01 HISTORY — DX: Cardiac arrhythmia, unspecified: I49.9

## 2021-09-01 LAB — BASIC METABOLIC PANEL
Anion gap: 14 (ref 5–15)
BUN: 9 mg/dL (ref 6–20)
CO2: 24 mmol/L (ref 22–32)
Calcium: 9.3 mg/dL (ref 8.9–10.3)
Chloride: 95 mmol/L — ABNORMAL LOW (ref 98–111)
Creatinine, Ser: 1.02 mg/dL (ref 0.61–1.24)
GFR, Estimated: >60 mL/min (ref >60)
Glucose, Bld: 108 mg/dL — ABNORMAL HIGH (ref 70–99)
Potassium: 3.2 mmol/L — ABNORMAL LOW (ref 3.5–5.1)
Sodium: 133 mmol/L — ABNORMAL LOW (ref 135–145)

## 2021-09-01 LAB — CBC
HCT: 39.6 % (ref 39.0–52.0)
Hemoglobin: 14.4 g/dL (ref 13.0–17.0)
MCH: 33.7 pg (ref 26.0–34.0)
MCHC: 36.4 g/dL — ABNORMAL HIGH (ref 30.0–36.0)
MCV: 92.7 fL (ref 80.0–100.0)
Platelets: 297 10*3/uL (ref 150–400)
RBC: 4.27 MIL/uL (ref 4.22–5.81)
RDW: 11.9 % (ref 11.5–15.5)
WBC: 6 10*3/uL (ref 4.0–10.5)
nRBC: 0 % (ref 0.0–0.2)

## 2021-09-01 LAB — SURGICAL PCR SCREEN
MRSA, PCR: NEGATIVE
Staphylococcus aureus: POSITIVE — AB

## 2021-09-01 NOTE — Progress Notes (Signed)
Surgical Instructions    Your procedure is scheduled on Tuesday, July 25th, 2023.   Report to [facility] Main Entrance "A" at 05:30 A.M., then check in with the Admitting office.  Call this number if you have problems the morning of surgery:  [phone]   If you have any questions prior to your surgery date call [phone]: Open Monday-Friday 8am-4pm    Remember:  Do not eat after midnight the night before your surgery  You may drink clear liquids until 04:30 the morning of your surgery.   Clear liquids allowed are: Water, Non-Citrus Juices (without pulp), Carbonated Beverages, Clear Tea, Black Coffee ONLY (NO MILK, CREAM OR POWDERED CREAMER of any kind), and Gatorade  Patient Instructions  The night before surgery:  No food after midnight. ONLY clear liquids after midnight  The day of surgery (if you do NOT have diabetes):  Drink ONE (1) Pre-Surgery Clear Ensure by 04:30 the morning of surgery. Drink in one sitting. Do not sip.  This drink was given to you during your hospital  pre-op appointment visit.  Nothing else to drink after completing the  Pre-Surgery Clear Ensure.         If you have questions, please contact your surgeon's office.     Take these medicines the morning of surgery with A SIP OF WATER:   amLODipine (NORVASC) metoprolol succinate (TOPROL-XL) pantoprazole (PROTONIX)  sertraline (ZOLOFT)   As of today, STOP taking any Aspirin (unless otherwise instructed by your surgeon) Aleve, Naproxen, Ibuprofen, Motrin, Advil, Goody's, BC's, all herbal medications, fish oil, and all vitamins.    The day of surgery:          Do not wear jewelry Do not wear lotions, powders, colognes, or deodorant. Men may shave face and neck. Do not bring valuables to the hospital.   [facility] is not responsible for any belongings or valuables. .   Do NOT Smoke (Tobacco/Vaping)  24 hours prior to your procedure  If you use a CPAP at night, you may bring your mask for  your overnight stay.   Contacts, glasses, hearing aids, dentures or partials may not be worn into surgery, please bring cases for these belongings   For patients admitted to the hospital, discharge time will be determined by your treatment team.   Patients discharged the day of surgery will not be allowed to drive home, and someone needs to stay with them for 24 hours.   SURGICAL WAITING ROOM VISITATION Patients having surgery or a procedure may have no more than 2 support people in the waiting area - these visitors may rotate.   Children under the age of 103 must have an adult with them who is not the patient. If the patient needs to stay at the hospital during part of their recovery, the visitor guidelines for inpatient rooms apply. Pre-op nurse will coordinate an appropriate time for 1 support person to accompany patient in pre-op.  This support person may not rotate.   Please refer to the [facility] website for the visitor guidelines for Inpatients (after your surgery is over and you are in a regular room).    Special instructions:    Oral Hygiene is also important to reduce your risk of infection.  Remember - BRUSH YOUR TEETH THE MORNING OF SURGERY WITH YOUR REGULAR TOOTHPASTE   [place]- Preparing For Surgery  Before surgery, you can play an important role. Because skin is not sterile, your skin needs to be as free of germs  as possible. You can reduce the number of germs on your skin by washing with CHG (chlorahexidine gluconate) Soap before surgery.  CHG is an antiseptic cleaner which kills germs and bonds with the skin to continue killing germs even after washing.     Please do not use if you have an allergy to CHG or antibacterial soaps. If your skin becomes reddened/irritated stop using the CHG.  Do not shave (including legs and underarms) for at least 48 hours prior to first CHG shower. It is OK to shave your face.  Please follow these instructions carefully.     Shower  the NIGHT BEFORE SURGERY and the MORNING OF SURGERY with CHG Soap.   If you chose to wash your hair, wash your hair first as usual with your normal shampoo. After you shampoo, rinse your hair and body thoroughly to remove the shampoo.  Then [employer] and genitals (private parts) with your normal soap and rinse thoroughly to remove soap.  After that Use CHG Soap as you would any other liquid soap. You can apply CHG directly to the skin and wash gently with a scrungie or a clean washcloth.   Apply the CHG Soap to your body ONLY FROM THE NECK DOWN.  Do not use on open wounds or open sores. Avoid contact with your eyes, ears, mouth and genitals (private parts). Wash Face and genitals (private parts)  with your normal soap.   Wash thoroughly, paying special attention to the area where your surgery will be performed.  Thoroughly rinse your body with warm water from the neck down.  DO NOT shower/wash with your normal soap after using and rinsing off the CHG Soap.  Pat yourself dry with a CLEAN TOWEL.  Wear CLEAN PAJAMAS to bed the night before surgery  Place CLEAN SHEETS on your bed the night before your surgery  DO NOT SLEEP WITH PETS.   Day of Surgery:  Take a shower with CHG soap. Wear Clean/Comfortable clothing the morning of surgery Do not apply any deodorants/lotions.   Remember to brush your teeth WITH YOUR REGULAR TOOTHPASTE.    If you received a COVID test during your pre-op visit, it is requested that you wear a mask when out in public, stay away from anyone that may not be feeling well, and notify your surgeon if you develop symptoms. If you have been in contact with anyone that has tested positive in the last 10 days, please notify your surgeon.    Please read over the following fact sheets that you were given.

## 2021-09-01 NOTE — Progress Notes (Addendum)
PCP - Aleatha Borer NP. Scheduled to see new PCP,Sarah Wallace Cullens NP in August. Cardiologist - Denies  PPM/ICD - denies Device Orders -  Rep Notified -   Chest x-ray -  EKG - 09/01/21 Stress Test -  ECHO - 05/19/2017 Cardiac Cath -denies   Sleep Study - 06/29/17 CPAP -no   Fasting Blood Sugar - na Checks Blood Sugar _____ times a day  Blood Thinner Instructions:na Aspirin Instructions:na  ERAS Protcol -clear liquids until 0430 PRE-SURGERY Ensure or G2- Ensure  COVID TEST- na   Anesthesia review: yes-history of afib and cardioversion Pt's BP elevated at PAT appointment -140/100 on recheck. He said he took his Norvasc and Toprol this am. He denied chest pain and shortness of breath. After speaking with Rica Mast NP for anesthesia, advised patient to check his BP at least daily prior to surgery and call his PCP if his BP gets higher than 140/100. He is changing PCP,but office of Aleatha Borer NP has called him to schedule an appointment recently,so advised him to call her office for BP issues prior to surgery.  Patient denies shortness of breath, fever, cough and chest pain at PAT appointment   All instructions explained to the patient, with a verbal understanding of the material. Patient agrees to go over the instructions while at home for a better understanding. Patient also instructed to wear a mask when out in public prior to surgery. The opportunity to ask questions was provided.

## 2021-09-02 NOTE — Progress Notes (Signed)
Anesthesia Chart Review:   Case: [id number] Date/Time: 09/08/21 0715   Procedure: LEFT SHOULDER ARTHROSCOPIC LABRAL REPAIR (Left: Shoulder)   Anesthesia type: General   Pre-op diagnosis: left shoulder labral tear   [facility] ROOM 06 / [facility] OR   Surgeons: Cammy Copa, MD       DISCUSSION: Pt is 41 years old with hx HTN, PAF (s/p cardioversion March 2019, no recurrence)  BP initially 163/103 at pre-admission testing, recheck was 140/100. Pt took both amlodipine and metoprolol today as prescribed. Pt instructed to monitor BP at home and if it remains high like it is at PAT, he is to f/u with his PCP for guidance on improving BP control. I notified Debbie in Dr. Diamantina Providence office.    VS: BP (!) 140/100 Comment: manual  Pulse (!) 107   Temp 37.1 C (Oral)   Resp 17   Ht 5\' 8"  (1.727 m)   Wt 95.8 kg   SpO2 100%   BMI 32.10 kg/m    PROVIDERS: - PCP is , NP but he has appt with new PCP Aleatha Borer, NP in August - Used to be seen in Afib clinic, last office visit 07/05/17 with 07/07/17, NP. PRN f/u recommended.    LABS: Labs reviewed: Acceptable for surgery. (all labs ordered are listed, but only abnormal results are displayed)  Labs Reviewed  SURGICAL PCR SCREEN - Abnormal; Notable for the following components:      Result Value   Staphylococcus aureus POSITIVE (*)    All other components within normal limits  CBC - Abnormal; Notable for the following components:   MCHC 36.4 (*)    All other components within normal limits  BASIC METABOLIC PANEL - Abnormal; Notable for the following components:   Sodium 133 (*)    Potassium 3.2 (*)    Chloride 95 (*)    Glucose, Bld 108 (*)    All other components within normal limits    EKG 09/01/21: NSR   CV: Echo 05/19/17:  - Left ventricle: The cavity size was normal. There was mild focal basal and mild concentric hypertrophy of the septum. Systolic function was normal. The estimated ejection fraction was in  the range of 60% to 65%. Wall motion was normal; there were no regional wall motion abnormalities. Features are consistent with a pseudonormal left ventricular filling pattern, with concomitant abnormal relaxation and increased filling pressure (grade 2 diastolic dysfunction).  - Aortic valve: There was no regurgitation.  - Mitral valve: There was trivial regurgitation.  - Left atrium: The atrium was moderately dilated.  - Right ventricle: The cavity size was normal. Wall thickness was normal. Systolic function was normal.  - Right atrium: The atrium was normal in size.  - Tricuspid valve: There was no regurgitation.  - Pulmonary arteries: Systolic pressure could not be accurately estimated.  - Inferior vena cava: The vessel was normal in size.  - Pericardium, extracardiac: There was no pericardial effusion.    Past Medical History:  Diagnosis Date   Anxiety    Dysrhythmia    GERD (gastroesophageal reflux disease)    History of cardioversion 05/04/2017   Hypertension    Paroxysmal atrial fibrillation (HCC)     Past Surgical History:  Procedure Laterality Date   NO PAST SURGERIES      MEDICATIONS:  amLODipine (NORVASC) 10 MG tablet   amLODipine (NORVASC) 5 MG tablet   ibuprofen (ADVIL) 800 MG tablet   metoprolol succinate (TOPROL-XL) 50 MG 24 hr  tablet   metoprolol tartrate (LOPRESSOR) 25 MG tablet   pantoprazole (PROTONIX) 40 MG tablet   sertraline (ZOLOFT) 50 MG tablet   No current facility-administered medications for this encounter.    If BP acceptable day of surgery, I anticipate pt can proceed with surgery as scheduled.  Rica Mast, PhD, FNP-BC [facility] Stay [facility]/Anesthesiology Phone: [phone] 09/02/2021 10:27 AM

## 2021-09-02 NOTE — Anesthesia Preprocedure Evaluation (Addendum)
Anesthesia Evaluation  Patient identified by MRN, date of birth, ID band Patient awake    Reviewed: Allergy & Precautions, NPO status , Patient's Chart, lab work & pertinent test results, reviewed documented beta blocker date and time   Airway Mallampati: I  TM Distance: >3 FB Neck ROM: Full    Dental no notable dental hx. (+) Teeth Intact, Dental Advisory Given   Pulmonary neg pulmonary ROS,    Pulmonary exam normal breath sounds clear to auscultation       Cardiovascular hypertension, Pt. on medications Normal cardiovascular exam+ dysrhythmias  Rhythm:Regular Rate:Normal  CV: Echo 05/19/17:  - Left ventricle: The cavity size was normal. There was mild focal basal and mild concentric hypertrophy of the septum. Systolic function was normal. The estimated ejection fraction was in the range of 60% to 65%. Wall motion was normal; there were no regional wall motion abnormalities. Features are consistent with a pseudonormal left ventricular filling pattern, with concomitant abnormal relaxation and increased filling pressure (grade 2 diastolic dysfunction).  - Aortic valve: There was no regurgitation.  - Mitral valve: There was trivial regurgitation.  - Left atrium: The atrium was moderately dilated.  - Right ventricle: The cavity size was normal. Wall thickness was normal. Systolic function was normal.  - Right atrium: The atrium was normal in size.  - Tricuspid valve: There was no regurgitation.  - Pulmonary arteries: Systolic pressure could not be accurately estimated.  - Inferior vena cava: The vessel was normal in size.  - Pericardium, extracardiac: There was no pericardial effusion   Neuro/Psych PSYCHIATRIC DISORDERS Anxiety negative neurological ROS     GI/Hepatic Neg liver ROS, GERD  ,  Endo/Other  negative endocrine ROS  Renal/GU negative Renal ROS  negative genitourinary   Musculoskeletal negative musculoskeletal ROS (+)    Abdominal   Peds  Hematology negative hematology ROS (+)   Anesthesia Other Findings   Reproductive/Obstetrics                           Anesthesia Physical Anesthesia Plan  ASA: 3  Anesthesia Plan: General and Regional   Post-op Pain Management: Regional block* and Tylenol PO (pre-op)*   Induction: Intravenous  PONV Risk Score and Plan: 2 and Midazolam, Dexamethasone and Ondansetron  Airway Management Planned: Oral ETT  Additional Equipment:   Intra-op Plan:   Post-operative Plan: Extubation in OR  Informed Consent: I have reviewed the patients History and Physical, chart, labs and discussed the procedure including the risks, benefits and alternatives for the proposed anesthesia with the patient or authorized representative who has indicated his/her understanding and acceptance.     Dental advisory given  Plan Discussed with: CRNA  Anesthesia Plan Comments: (See APP note by Joslyn Hy, FNP )       Anesthesia Quick Evaluation

## 2021-09-08 ENCOUNTER — Encounter (HOSPITAL_COMMUNITY): Payer: Self-pay | Admitting: Orthopedic Surgery

## 2021-09-08 ENCOUNTER — Ambulatory Visit (HOSPITAL_BASED_OUTPATIENT_CLINIC_OR_DEPARTMENT_OTHER): Payer: 59 | Admitting: Physician Assistant

## 2021-09-08 ENCOUNTER — Encounter (HOSPITAL_COMMUNITY)
Admission: RE | Disposition: A | Discharge status: Home / Self Care | Payer: Self-pay | Source: Home / Self Care | Attending: Orthopedic Surgery

## 2021-09-08 ENCOUNTER — Ambulatory Visit (HOSPITAL_COMMUNITY): Payer: 59 | Admitting: Physician Assistant

## 2021-09-08 ENCOUNTER — Other Ambulatory Visit: Payer: Self-pay

## 2021-09-08 ENCOUNTER — Ambulatory Visit (HOSPITAL_COMMUNITY)
Admission: RE | Admit: 2021-09-08 | Discharge: 2021-09-08 | Disposition: A | Discharge status: Home / Self Care | Payer: 59 | Attending: Orthopedic Surgery | Admitting: Orthopedic Surgery

## 2021-09-08 DIAGNOSIS — I10 Essential (primary) hypertension: Secondary | ICD-10-CM | POA: Insufficient documentation

## 2021-09-08 DIAGNOSIS — X58XXXA Exposure to other specified factors, initial encounter: Secondary | ICD-10-CM | POA: Insufficient documentation

## 2021-09-08 DIAGNOSIS — S43432A Superior glenoid labrum lesion of left shoulder, initial encounter: Secondary | ICD-10-CM

## 2021-09-08 DIAGNOSIS — M25312 Other instability, left shoulder: Secondary | ICD-10-CM | POA: Diagnosis not present

## 2021-09-08 DIAGNOSIS — S43492A Other sprain of left shoulder joint, initial encounter: Secondary | ICD-10-CM | POA: Diagnosis present

## 2021-09-08 DIAGNOSIS — S43432D Superior glenoid labrum lesion of left shoulder, subsequent encounter: Secondary | ICD-10-CM | POA: Diagnosis not present

## 2021-09-08 DIAGNOSIS — S43439D Superior glenoid labrum lesion of unspecified shoulder, subsequent encounter: Secondary | ICD-10-CM

## 2021-09-08 DIAGNOSIS — Z01818 Encounter for other preprocedural examination: Secondary | ICD-10-CM

## 2021-09-08 DIAGNOSIS — R69 Illness, unspecified: Secondary | ICD-10-CM | POA: Diagnosis not present

## 2021-09-08 DIAGNOSIS — F419 Anxiety disorder, unspecified: Secondary | ICD-10-CM | POA: Diagnosis not present

## 2021-09-08 DIAGNOSIS — G8918 Other acute postprocedural pain: Secondary | ICD-10-CM | POA: Diagnosis not present

## 2021-09-08 HISTORY — PX: SHOULDER ARTHROSCOPY WITH LABRAL REPAIR: SHX5691

## 2021-09-08 SURGERY — ARTHROSCOPY, SHOULDER, WITH GLENOID LABRUM REPAIR
Anesthesia: Regional | Site: Shoulder | Laterality: Left

## 2021-09-08 MED ORDER — ARTIFICIAL TEARS OPHTHALMIC OINT
TOPICAL_OINTMENT | OPHTHALMIC | Status: AC
  Filled 2021-09-08: qty 3.5

## 2021-09-08 MED ORDER — GLYCOPYRROLATE PF 0.2 MG/ML IJ SOSY
PREFILLED_SYRINGE | INTRAMUSCULAR | Status: AC
  Filled 2021-09-08: qty 1

## 2021-09-08 MED ORDER — TRANEXAMIC ACID-NACL 1000-0.7 MG/100ML-% IV SOLN
INTRAVENOUS | Status: AC
  Filled 2021-09-08: qty 100

## 2021-09-08 MED ORDER — CEFAZOLIN SODIUM-DEXTROSE 2-4 GM/100ML-% IV SOLN
2.0000 g | INTRAVENOUS | Status: AC
  Administered 2021-09-08: 2 g via INTRAVENOUS

## 2021-09-08 MED ORDER — TRANEXAMIC ACID-NACL 1000-0.7 MG/100ML-% IV SOLN
1000.0000 mg | INTRAVENOUS | Status: AC
  Administered 2021-09-08: 1000 mg via INTRAVENOUS

## 2021-09-08 MED ORDER — ROCURONIUM BROMIDE 10 MG/ML (PF) SYRINGE
PREFILLED_SYRINGE | INTRAVENOUS | Status: DC | PRN
  Administered 2021-09-08: 100 mg via INTRAVENOUS

## 2021-09-08 MED ORDER — SUCCINYLCHOLINE CHLORIDE 200 MG/10ML IV SOSY
PREFILLED_SYRINGE | INTRAVENOUS | Status: AC
  Filled 2021-09-08: qty 10

## 2021-09-08 MED ORDER — EPINEPHRINE PF 1 MG/ML IJ SOLN
INTRAMUSCULAR | Status: AC
  Filled 2021-09-08: qty 4

## 2021-09-08 MED ORDER — PHENYLEPHRINE HCL-NACL 20-0.9 MG/250ML-% IV SOLN
INTRAVENOUS | Status: DC | PRN
  Administered 2021-09-08: 30 ug/min via INTRAVENOUS

## 2021-09-08 MED ORDER — ONDANSETRON HCL 4 MG/2ML IJ SOLN
INTRAMUSCULAR | Status: DC | PRN
  Administered 2021-09-08: 4 mg via INTRAVENOUS

## 2021-09-08 MED ORDER — ROCURONIUM BROMIDE 10 MG/ML (PF) SYRINGE
PREFILLED_SYRINGE | INTRAVENOUS | Status: AC
Start: 2021-09-08 — End: ?
  Filled 2021-09-08: qty 10

## 2021-09-08 MED ORDER — FENTANYL CITRATE (PF) 100 MCG/2ML IJ SOLN: 25.0000 ug | INTRAMUSCULAR | Status: DC | PRN

## 2021-09-08 MED ORDER — BUPIVACAINE HCL (PF) 0.5 % IJ SOLN
INTRAMUSCULAR | Status: DC | PRN
  Administered 2021-09-08: 15 mL via PERINEURAL

## 2021-09-08 MED ORDER — BUPIVACAINE HCL (PF) 0.25 % IJ SOLN
INTRAMUSCULAR | Status: AC
  Filled 2021-09-08: qty 30

## 2021-09-08 MED ORDER — LACTATED RINGERS IV SOLN: INTRAVENOUS | Status: DC

## 2021-09-08 MED ORDER — CEFAZOLIN SODIUM-DEXTROSE 2-4 GM/100ML-% IV SOLN
INTRAVENOUS | Status: AC
  Filled 2021-09-08: qty 100

## 2021-09-08 MED ORDER — LIDOCAINE 2% (20 MG/ML) 5 ML SYRINGE
INTRAMUSCULAR | Status: DC | PRN
  Administered 2021-09-08: 20 mg via INTRAVENOUS

## 2021-09-08 MED ORDER — CHLORHEXIDINE GLUCONATE 0.12 % MT SOLN: 15.0000 mL | Freq: Once | OROMUCOSAL | Status: AC

## 2021-09-08 MED ORDER — METHOCARBAMOL 500 MG PO TABS: 500.0000 mg | ORAL_TABLET | Freq: Three times a day (TID) | ORAL | 0 refills | Status: DC | PRN

## 2021-09-08 MED ORDER — FENTANYL CITRATE (PF) 250 MCG/5ML IJ SOLN
INTRAMUSCULAR | Status: DC | PRN
  Administered 2021-09-08: 50 ug via INTRAVENOUS
  Administered 2021-09-08: 100 ug via INTRAVENOUS

## 2021-09-08 MED ORDER — SODIUM CHLORIDE 0.9 % IR SOLN
Status: DC | PRN
  Administered 2021-09-08 (×6): 3000 mL

## 2021-09-08 MED ORDER — SODIUM CHLORIDE 0.9 % IR SOLN
Status: DC | PRN
  Administered 2021-09-08 (×4): 3000 mL

## 2021-09-08 MED ORDER — ORAL CARE MOUTH RINSE: 15.0000 mL | Freq: Once | OROMUCOSAL | Status: AC

## 2021-09-08 MED ORDER — NEOSTIGMINE METHYLSULFATE 3 MG/3ML IV SOSY
PREFILLED_SYRINGE | INTRAVENOUS | Status: AC
  Filled 2021-09-08: qty 3

## 2021-09-08 MED ORDER — ONDANSETRON HCL 4 MG/2ML IJ SOLN
INTRAMUSCULAR | Status: AC
Start: 2021-09-08 — End: ?
  Filled 2021-09-08: qty 2

## 2021-09-08 MED ORDER — PROPOFOL 10 MG/ML IV BOLUS
INTRAVENOUS | Status: DC | PRN
  Administered 2021-09-08: 50 mg via INTRAVENOUS
  Administered 2021-09-08: 150 mg via INTRAVENOUS

## 2021-09-08 MED ORDER — POVIDONE-IODINE 10 % EX SWAB
2.0000 | Freq: Once | CUTANEOUS | Status: AC
  Administered 2021-09-08: 2 via TOPICAL

## 2021-09-08 MED ORDER — SODIUM CHLORIDE (PF) 0.9 % IJ SOLN
INTRAMUSCULAR | Status: AC
Start: 2021-09-08 — End: ?
  Filled 2021-09-08: qty 10

## 2021-09-08 MED ORDER — MIDAZOLAM HCL 2 MG/2ML IJ SOLN
INTRAMUSCULAR | Status: DC | PRN
  Administered 2021-09-08: 2 mg via INTRAVENOUS

## 2021-09-08 MED ORDER — OXYCODONE HCL 5 MG PO TABS: 5.0000 mg | ORAL_TABLET | ORAL | 0 refills | Status: DC | PRN

## 2021-09-08 MED ORDER — SODIUM CHLORIDE 0.9 % IR SOLN
Status: DC | PRN
  Administered 2021-09-08: 1000 mL

## 2021-09-08 MED ORDER — ACETAMINOPHEN 500 MG PO TABS
ORAL_TABLET | ORAL | Status: AC
  Filled 2021-09-08: qty 2

## 2021-09-08 MED ORDER — LIDOCAINE 2% (20 MG/ML) 5 ML SYRINGE
INTRAMUSCULAR | Status: AC
Start: 2021-09-08 — End: ?
  Filled 2021-09-08: qty 5

## 2021-09-08 MED ORDER — SUGAMMADEX SODIUM 200 MG/2ML IV SOLN
INTRAVENOUS | Status: DC | PRN
  Administered 2021-09-08: 200 mg via INTRAVENOUS

## 2021-09-08 MED ORDER — ASPIRIN 81 MG PO TBEC: 81.0000 mg | DELAYED_RELEASE_TABLET | Freq: Every day | ORAL | 0 refills | Status: DC

## 2021-09-08 MED ORDER — POVIDONE-IODINE 7.5 % EX SOLN
Freq: Once | CUTANEOUS | Status: DC
  Filled 2021-09-08: qty 118

## 2021-09-08 MED ORDER — ACETAMINOPHEN 500 MG PO TABS
1000.0000 mg | ORAL_TABLET | Freq: Once | ORAL | Status: AC
Start: 2021-09-08 — End: 2021-09-08
  Administered 2021-09-08: 1000 mg via ORAL

## 2021-09-08 MED ORDER — BUPIVACAINE LIPOSOME 1.3 % IJ SUSP
INTRAMUSCULAR | Status: DC | PRN
  Administered 2021-09-08: 10 mL via PERINEURAL

## 2021-09-08 MED ORDER — DEXAMETHASONE SODIUM PHOSPHATE 10 MG/ML IJ SOLN
INTRAMUSCULAR | Status: DC | PRN
  Administered 2021-09-08: 10 mg via INTRAVENOUS

## 2021-09-08 MED ORDER — CHLORHEXIDINE GLUCONATE 0.12 % MT SOLN
OROMUCOSAL | Status: AC
  Administered 2021-09-08: 15 mL via OROMUCOSAL
  Filled 2021-09-08: qty 15

## 2021-09-08 SURGICAL SUPPLY — 56 items
ANCH SUT 2 FBRTK KNTLS 1.8 (Anchor) ×8 IMPLANT
ANCHOR SUT 1.8 FIBERTAK SB KL (Anchor) ×8 IMPLANT
BAG COUNTER SPONGE SURGICOUNT (BAG) ×3 IMPLANT
BAG SPNG CNTER NS LX DISP (BAG) ×1
BLADE EXCALIBUR 4.0X13 (MISCELLANEOUS) ×3 IMPLANT
CANNULA 5.75X71 LONG (CANNULA) ×1 IMPLANT
CANNULA TWIST IN 8.25X7CM (CANNULA) ×3 IMPLANT
COVER SURGICAL LIGHT HANDLE (MISCELLANEOUS) ×3 IMPLANT
Coban 4" ×1 IMPLANT
DISSECTOR 4.0MM X 13CM (MISCELLANEOUS) ×3 IMPLANT
DRAPE INCISE IOBAN 66X45 STRL (DRAPES) ×3 IMPLANT
DRAPE STERI 35X30 U-POUCH (DRAPES) ×6 IMPLANT
DRSG TEGADERM 2-3/8X2-3/4 SM (GAUZE/BANDAGES/DRESSINGS) ×2 IMPLANT
DRSG TEGADERM 4X4.75 (GAUZE/BANDAGES/DRESSINGS) ×1 IMPLANT
DURAPREP 26ML APPLICATOR (WOUND CARE) ×3 IMPLANT
DW OUTFLOW CASSETTE/TUBE SET (MISCELLANEOUS) ×3 IMPLANT
GAUZE SPONGE 4X4 12PLY STRL (GAUZE/BANDAGES/DRESSINGS) ×3 IMPLANT
GAUZE XEROFORM 1X8 LF (GAUZE/BANDAGES/DRESSINGS) ×3 IMPLANT
GLOVE BIOGEL PI IND STRL 8 (GLOVE) ×2 IMPLANT
GLOVE BIOGEL PI INDICATOR 8 (GLOVE) ×2
GLOVE ECLIPSE 8.0 STRL XLNG CF (GLOVE) ×3 IMPLANT
GLOVE SURG POLYISO LF SZ7 (GLOVE) ×3 IMPLANT
GLOVE SURG SYN 6.5 ES PF (GLOVE) ×4 IMPLANT
GLOVE SURG SYN 6.5 PF PI (GLOVE) ×4 IMPLANT
GOWN STRL REUS W/ TWL LRG LVL3 (GOWN DISPOSABLE) ×6 IMPLANT
GOWN STRL REUS W/TWL LRG LVL3 (GOWN DISPOSABLE) ×6
IV NS IRRIG 3000ML ARTHROMATIC (IV SOLUTION) ×10 IMPLANT
KIT BASIN OR (CUSTOM PROCEDURE TRAY) ×3 IMPLANT
KIT PERC INSERT 3.0 KNTLS (KITS) ×3 IMPLANT
KIT STR SPEAR 1.8 FBRTK DISP (KITS) ×3 IMPLANT
KIT TURNOVER KIT B (KITS) ×3 IMPLANT
LASSO CRESCENT QUICKPASS (SUTURE) ×2 IMPLANT
MANIFOLD NEPTUNE II (INSTRUMENTS) ×3 IMPLANT
NDL SPNL 18GX3.5 QUINCKE PK (NEEDLE) ×2 IMPLANT
NDL SUT 2-0 SCORPION KNEE (NEEDLE) ×2 IMPLANT
NEEDLE SPNL 18GX3.5 QUINCKE PK (NEEDLE) ×2 IMPLANT
NEEDLE SUT 2-0 SCORPION KNEE (NEEDLE) ×2 IMPLANT
NS IRRIG 1000ML POUR BTL (IV SOLUTION) ×3 IMPLANT
PACK SHOULDER (CUSTOM PROCEDURE TRAY) ×3 IMPLANT
PAD ARMBOARD 7.5X6 YLW CONV (MISCELLANEOUS) ×6 IMPLANT
PORT APPOLLO RF 90DEGREE MULTI (SURGICAL WAND) ×1 IMPLANT
PROBE APOLLO 90XL (SURGICAL WAND) ×1 IMPLANT
SLEEVE ARM SUSPENSION SYSTEM (MISCELLANEOUS) ×1 IMPLANT
SLING ARM ULTRA III XL (SLING) ×1 IMPLANT
SLING S3 LATERAL DISP (MISCELLANEOUS) ×1 IMPLANT
SPONGE T-LAP 4X18 ~~LOC~~+RFID (SPONGE) ×2 IMPLANT
SUT ETHILON 3 0 PS 1 (SUTURE) ×6 IMPLANT
SUT FIBERWIRE 2-0 18 17.9 3/8 (SUTURE)
SUT VIC AB 2-0 CT1 27 (SUTURE) ×2
SUT VIC AB 2-0 CT1 TAPERPNT 27 (SUTURE) IMPLANT
SUTURE FIBERWR 2-0 18 17.9 3/8 (SUTURE) IMPLANT
SYR 30ML LL (SYRINGE) ×3 IMPLANT
TOWEL GREEN STERILE (TOWEL DISPOSABLE) ×3 IMPLANT
TOWEL GREEN STERILE FF (TOWEL DISPOSABLE) ×3 IMPLANT
TUBING ARTHROSCOPY IRRIG 16FT (MISCELLANEOUS) ×3 IMPLANT
WATER STERILE IRR 1000ML POUR (IV SOLUTION) ×3 IMPLANT

## 2021-09-08 NOTE — Progress Notes (Signed)
[id number] notified Dr. Armond Hang of pt's blood pressure. Orders to start IV. Continue to monitor.

## 2021-09-08 NOTE — Anesthesia Procedure Notes (Signed)
Anesthesia Regional Block: Interscalene brachial plexus block   Pre-Anesthetic Checklist: , timeout performed,  Correct Patient, Correct Site, Correct Laterality,  Correct Procedure, Correct Position, site marked,  Risks and benefits discussed,  Pre-op evaluation,  At surgeon's request and post-op pain management  Laterality: Left  Prep: Maximum Sterile Barrier Precautions used, chloraprep       Needles:  Injection technique: Single-shot  Needle Type: Echogenic Stimulator Needle     Needle Length: 5cm  Needle Gauge: 21     Additional Needles:   Procedures:,,,, ultrasound used (permanent image in chart),,    Narrative:  Start time: 09/08/2021 7:20 AM End time: 09/08/2021 7:22 AM Injection made incrementally with aspirations every 5 mL. Anesthesiologist: Elmer Picker, MD

## 2021-09-08 NOTE — Op Note (Unsigned)
NAMEJALONI, Devon Hanson MEDICAL RECORD NO: [id number] ACCOUNT NO: [id number] DATE OF BIRTH: 1980/12/24 FACILITY: [facility] LOCATION: [facility] PHYSICIAN: Graylin Shiver. August Saucer, MD  Operative Report   DATE OF PROCEDURE: 09/08/2021  PREOPERATIVE DIAGNOSIS:  Left shoulder posterior labral tear and SLAP tear.  POSTOPERATIVE DIAGNOSIS:  Left shoulder posterior labral tear and SLAP tear.  PROCEDURE:  Left shoulder arthroscopy with posterior labral tear repair and SLAP tear repair using Arthrex anchor.  SURGEON:  Graylin Shiver. August Saucer, MD  ASSIST:  April Green, RNFA.  INDICATIONS:  A 41 year old patient who sustained a left shoulder injury several months back.  MRI scan shows posterior labral tear extending into the biceps anchor, both anteriorly and posteriorly.  He presents now for operative management after  explanation of risks and benefits.  DESCRIPTION OF PROCEDURE:  The patient was brought to the operating room where general anesthetic was induced.  Preoperative antibiotics administered.  Timeout was called.  Left shoulder examined under anesthesia.  He was found to have passive range of  motion of 70/110/175.  The patient had 2+ posterior plus 1+ anterior instability with less than a centimeter sulcus sign.  The patient was placed in lateral position with the right axilla, right peroneal nerve well padded.  Left arm was then suspended  with 15 pounds of traction in 10 degrees of forward flexion and 45 degrees of abduction.  Left arm, shoulder and hand prescrubbed with alcohol and Betadine, allowed to air dry, prepped with DuraPrep solution and draped in sterile manner.  Ioban used to  seal the operative field and cover the axilla.  Next, after calling timeout and sterile prepping and draping, a posterior portal was created.  Anterior portal created under direct visualization.  The patient had mild degenerative changes on the glenoid  surface, but not on the humeral surface.  There was a posterior labral  tear from the 2 o'clock to 5 o'clock position.  There was also a SLAP tear extending from the 10 o'clock to 2 o'clock position.  The bony edges were prepared using a rasp and a  shaver.  Accessory percutaneous portal was created after a portal reversal was performed.  Looking from the anterior portal, 3 suture anchors were placed in this posterior labral tear from essentially 2 o'clock to 5 o'clock.  There was chondral surface,  which was delaminated, which was debrided.  The tissue mobilized nicely and filled into that small chondral labral defect, which measured about 3 x 4 mm.  Anchors were placed and good bolstering of tissue was achieved.  Next, a second percutaneous portal  was placed in order to facilitate drilling and anchor placement into the posterior superior labrum, 2 anchors were placed in the 1 o'clock and 2 o'clock position through that percutaneous portal.  Next, the portals were reversed and 3 more anchors were  placed in anterior portion of the biceps anchor attachment and 4 more were then placed down to the equator.  This was to reattach labral tissue, which had been detached.  The anterior inferior glenohumeral ligament was intact.  Next, thorough irrigation  of the joint was performed.  Overall, the biceps anchor was stabilized.  The posterior labrum was also stabilized with good bumper restored.  Thorough irrigation was performed.  The weights were released on the arm.  The portals were closed using 2-0  Vicryl, 3-0 nylon.  Percutaneous portals #3 were closed using 3-0 nylon.  Impervious dressing and a Donjoy type brace were applied.  The patient tolerated the procedure well  without immediate complication and transferred to the recovery room in stable  condition.  It should be noted that the suture passage using the crescent suture hook did not wear more than 5 mm from the labrum where it had attached to the glenoid.   PUS D: 09/08/2021 11:37:19 am T: 09/08/2021 4:08:00 pm  JOB:  [id number]/ [id number]

## 2021-09-08 NOTE — H&P (Signed)
Devon Hanson is an 41 y.o. male.   Chief Complaint: left shoulder pain HPI: Devon Hanson is a 41 y.o. male who presents to the office for MRI review. Patient denies any changes in symptoms.  Continues to complain mainly of left shoulder.  Patient states that he has had pain for 2 months since he was playing with his son.  He brought his arm back behind him very quickly and forcefully in a joking gesture and noticed increased pain after that event.  Localizes pain to the anterior aspect of the shoulder and feels like his shoulder is subluxing at times.  No actual shoulder dislocation or instability that he has a history of.  Pain radiates down the anterior humerus to the elbow.  No history of prior surgery to the left shoulder.  He would like to return to weightlifting but this symptoms of pain and instability is getting in the way of that.  Pain wakes him up at night.  He is left-hand dominant.  Using his sling as needed.  Had an injection in the subacromial space by Dr. Lajoyce Corners without any relief.  He works as a Financial risk analyst.  No history of diabetes or smoking.  Does have a history of hypertension. MRI scan of the left shoulder demonstrates posterior labral tearing.  Rotator cuff intact.  The tear may extend into the superior labrum and inferior labrum.  Past Medical History:  Diagnosis Date   Anxiety    Dysrhythmia    GERD (gastroesophageal reflux disease)    History of cardioversion 05/04/2017   Hypertension    Paroxysmal atrial fibrillation [facility])     Past Surgical History:  Procedure Laterality Date   NO PAST SURGERIES      History reviewed. No pertinent family history. Social History:  reports that he has never smoked. He has never used smokeless tobacco. He reports current alcohol use. He reports that he does not use drugs.  Allergies: No Known Allergies  Medications Prior to Admission  Medication Sig Dispense Refill   amLODipine (NORVASC) 5 MG tablet Take 5 mg by mouth daily.      metoprolol succinate (TOPROL-XL) 50 MG 24 hr tablet Take 50 mg by mouth daily.     pantoprazole (PROTONIX) 40 MG tablet Take 40 mg by mouth daily.     sertraline (ZOLOFT) 50 MG tablet Take 50 mg by mouth daily.     amLODipine (NORVASC) 10 MG tablet Take 1 tablet (10 mg total) by mouth daily. (Patient not taking: Reported on 09/01/2021) 30 tablet 0   ibuprofen (ADVIL) 800 MG tablet Take 1 tablet (800 mg total) by mouth every 8 (eight) hours as needed for moderate pain. 30 tablet 0   metoprolol tartrate (LOPRESSOR) 25 MG tablet Take 1 tablet (25 mg total) by mouth 2 (two) times daily. (Patient not taking: Reported on 09/01/2021) 60 tablet 0    No results found for this or any previous visit (from the past 48 hour(s)). No results found.  Review of Systems  Musculoskeletal:  Positive for arthralgias.  All other systems reviewed and are negative.   Blood pressure (!) 166/107, pulse 89, temperature 98.7 F (37.1 C), temperature source Oral, height 5\' 8"  (1.727 m), weight 94.3 kg, SpO2 100 %. Physical Exam Vitals reviewed.  HENT:     Head: Normocephalic.     Nose: Nose normal.     Mouth/Throat:     Mouth: Mucous membranes are moist.  Eyes:     Pupils: Pupils are equal, round, and  reactive to light.  Cardiovascular:     Rate and Rhythm: Normal rate.     Pulses: Normal pulses.  Pulmonary:     Effort: Pulmonary effort is normal.  Abdominal:     General: Abdomen is flat.  Musculoskeletal:     Cervical back: Normal range of motion.  Skin:    General: Skin is warm.     Capillary Refill: Capillary refill takes less than 2 seconds.  Neurological:     General: No focal deficit present.     Mental Status: He is alert.  Psychiatric:        Mood and Affect: Mood normal.    Ortho exam demonstrates left shoulder with 60 degrees external rotation, 110 degrees abduction, 170 degrees forward flexion.  This compared with the right shoulder with 70 degrees X rotation, 120 degrees abduction, 180  degrees forward flexion.  He has moderate tenderness over the bicipital groove and positive O'Brien sign.  No tenderness over the [facility] joint.  He has a clicking sensation with passive motion of the shoulder that is felt in the posterior inferior aspect of the shoulder.  No coarseness or grinding in the area of the rotator cuff.  He has positive apprehension sign.  Negative relocation sign.  5/5 motor strength of bilateral grip strength, EPL, FPL, finger abduction, pronation/supination, bicep, tricep, deltoid.  Axillary nerve is intact with deltoid firing.  Does have increased pain with supination and bicep flexion.  Excellent rotator cuff strength of supra, infra, subscap.    Assessment/Plan Impression is left shoulder pain and instability consistent with posterior labral pathology and subluxation following traumatic injury.  Plan at this time is arthroscopic labral repair.  The risk and benefits of procedure discussed with the patient including not limited to infection nerve or vessel damage possible stiffness as well as incomplete restoration of function and incomplete pain relief.  Extensive nature of the rehabilitative process is discussed.  All questions answered  Burnard Bunting, MD 09/08/2021, 6:39 AM

## 2021-09-08 NOTE — Brief Op Note (Signed)
   09/08/2021  11:31 AM  PATIENT:  Law Corsino  41 y.o. male  PRE-OPERATIVE DIAGNOSIS:  left shoulder labral tear  POST-OPERATIVE DIAGNOSIS:  left shoulder posterior labral tear and SLAP tear  PROCEDURE:  Procedure(s): LEFT SHOULDER ARTHROSCOPIC posterior LABRAL REPAIR and slap repair  SURGEON:  Surgeon(s): Cammy Copa, MD  ASSISTANT: green rnfa  ANESTHESIA:   general  EBL: 5 ml    Total I/O In: 1300 [I.V.:1200; IV Piggyback:100] Out: 30 [Blood:30]  BLOOD ADMINISTERED: none  DRAINS: none   LOCAL MEDICATIONS USED:  none  SPECIMEN:  No Specimen  COUNTS:  YES  TOURNIQUET:  * No tourniquets in log *  DICTATION: .Other Dictation: Dictation Number [id number]  PLAN OF CARE: Discharge to home after PACU  PATIENT DISPOSITION:  PACU - hemodynamically stable

## 2021-09-08 NOTE — Anesthesia Procedure Notes (Signed)
Procedure Name: Intubation Date/Time: 09/08/2021 8:12 AM  Performed by: Shary Decamp, CRNAPre-anesthesia Checklist: Patient identified, Patient being monitored, Timeout performed, Emergency Drugs available and Suction available Patient Re-evaluated:Patient Re-evaluated prior to induction Oxygen Delivery Method: Circle System Utilized Preoxygenation: Pre-oxygenation with 100% oxygen Induction Type: IV induction Ventilation: Mask ventilation without difficulty Laryngoscope Size: Miller and 2 Grade View: Grade I Tube type: Oral Tube size: 7.5 mm Number of attempts: 1 Airway Equipment and Method: Stylet Placement Confirmation: ETT inserted through vocal cords under direct vision, positive ETCO2 and breath sounds checked- equal and bilateral Secured at: 22 cm Tube secured with: Tape Dental Injury: Teeth and Oropharynx as per pre-operative assessment

## 2021-09-08 NOTE — Progress Notes (Signed)
Orthopedic Tech Progress Note Patient Details:  Devon Hanson 12-26-1980 [id number]  OR RN called requesting an ABDUCTION SHOULDER PILLOW per MD. Dropped off to desk   Ortho Devices Type of Ortho Device: Shoulder abduction pillow Ortho Device/Splint Location: LUE Ortho Device/Splint Interventions: Ordered, Other (comment)   Post Interventions Patient Tolerated: Well Instructions Provided: Care of device  [name] 09/08/2021, 11:25 AM

## 2021-09-08 NOTE — Transfer of Care (Signed)
Immediate Anesthesia Transfer of Care Note  Patient: Celia Gibbons  Procedure(s) Performed: LEFT SHOULDER ARTHROSCOPIC LABRAL REPAIR (Left: Shoulder)  Patient Location: PACU  Anesthesia Type:GA combined with regional for post-op pain  Level of Consciousness: drowsy, patient cooperative and responds to stimulation  Airway & Oxygen Therapy: Patient Spontanous Breathing and Patient connected to nasal cannula oxygen  Post-op Assessment: Report given to RN and Post -op Vital signs reviewed and stable  Post vital signs: Reviewed and stable  Last Vitals:  Vitals Value Taken Time  BP    Temp    Pulse    Resp    SpO2      Last Pain:  Vitals:   09/08/21 0601  TempSrc:   PainSc: 4          Complications: No notable events documented.

## 2021-09-09 ENCOUNTER — Encounter (HOSPITAL_COMMUNITY): Payer: Self-pay | Admitting: Orthopedic Surgery

## 2021-09-09 NOTE — Anesthesia Postprocedure Evaluation (Signed)
Anesthesia Post Note  Patient: Braylee Bosher  Procedure(s) Performed: LEFT SHOULDER ARTHROSCOPIC LABRAL REPAIR (Left: Shoulder)     Patient location during evaluation: PACU Anesthesia Type: Regional and General Level of consciousness: awake and alert Pain management: pain level controlled Vital Signs Assessment: post-procedure vital signs reviewed and stable Respiratory status: spontaneous breathing, nonlabored ventilation, respiratory function stable and patient connected to nasal cannula oxygen Cardiovascular status: blood pressure returned to baseline and stable Postop Assessment: no apparent nausea or vomiting Anesthetic complications: no   No notable events documented.  Last Vitals:  Vitals:   09/08/21 1200 09/08/21 1215  BP: 122/86 117/87  Pulse: 86 84  Resp: (!) 22 18  Temp:  36.8 C  SpO2: 94% 94%    Last Pain:  Vitals:   09/08/21 1215  TempSrc:   PainSc: 0-No pain   Pain Goal:                   Kennon Encinas L [place]

## 2021-09-11 ENCOUNTER — Telehealth: Payer: Self-pay | Admitting: Orthopedic Surgery

## 2021-09-11 NOTE — Telephone Encounter (Signed)
Pt submitted medical release form, short term disability forms, and $25.00 check to [employer]. Accepted 09/11/2021

## 2021-09-13 DIAGNOSIS — M25312 Other instability, left shoulder: Secondary | ICD-10-CM

## 2021-09-13 DIAGNOSIS — S43439D Superior glenoid labrum lesion of unspecified shoulder, subsequent encounter: Secondary | ICD-10-CM

## 2021-09-16 ENCOUNTER — Ambulatory Visit (INDEPENDENT_AMBULATORY_CARE_PROVIDER_SITE_OTHER): Payer: 59 | Admitting: Surgical

## 2021-09-16 ENCOUNTER — Encounter: Payer: Self-pay | Admitting: Orthopedic Surgery

## 2021-09-16 DIAGNOSIS — S43002A Unspecified subluxation of left shoulder joint, initial encounter: Secondary | ICD-10-CM

## 2021-09-16 NOTE — Progress Notes (Signed)
   Post-Op Visit Note   Patient: Devon Hanson           Date of Birth: [birth year]           MRN: [id number] Visit Date: 09/16/2021 PCP: Zachery Dauer, FNP   Assessment & Plan:  Chief Complaint:  Chief Complaint  Patient presents with   Left Shoulder - Routine Post Op    09/08/21 (8d) Left Shoulder Arthroscopic Labral Repair      Visit Diagnoses:  1. Acquired subluxation of left shoulder, initial encounter     Plan: Patient is a 41 year old male who presents s/p left shoulder arthroscopy with labral repair on 09/08/2021.  He is currently in the sling and does not discontinue the sling at all except for occasional straightening of his elbow and when he showers.  He describes minimal pain and he is off pain medication completely.  No fevers, chills, drainage.  No significant complaints occasional aching or stiffness in the shoulder.  No numbness or tingling.  Block is fully worn off.  On exam, patient has incisions that are healing well without evidence of infection or dehiscence.  Sutures are intact.  These were removed and replaced with Steri-Strips.  Axillary nerve intact with deltoid firing.  5/5 motor strength of EPL, FPL, finger abduction, finger adduction, pronation/supination, bicep, tricep.  No significant laxity to anterior posterior directed forces.  Plan is continue with sling immobilization and follow-up in 2 weeks for clinical recheck and initiation of below shoulder range of motion.  He denies any clicking or subluxation symptoms today.  Follow-Up Instructions: No follow-ups on file.   Orders:  No orders of the defined types were placed in this encounter.  No orders of the defined types were placed in this encounter.   Imaging: No results found.  PMFS History: Patient Active Problem List   Diagnosis Date Noted   Shoulder joint instability, left    Type 2 superior labral anterior-to-posterior (SLAP) tear of shoulder, subsequent encounter    Past Medical History:   Diagnosis Date   Anxiety    Dysrhythmia    GERD (gastroesophageal reflux disease)    History of cardioversion 05/04/2017   Hypertension    Paroxysmal atrial fibrillation (HCC)     No family history on file.  Past Surgical History:  Procedure Laterality Date   NO PAST SURGERIES     SHOULDER ARTHROSCOPY WITH LABRAL REPAIR Left 09/08/2021   Procedure: LEFT SHOULDER ARTHROSCOPIC LABRAL REPAIR;  Surgeon: Cammy Copa, MD;  Location: [facility] OR;  Service: Orthopedics;  Laterality: Left;   Social History   Occupational History   Not on file  Tobacco Use   Smoking status: Never   Smokeless tobacco: Never  Vaping Use   Vaping Use: Never used  Substance and Sexual Activity   Alcohol use: Yes    Comment: moderate liquor intake   Drug use: Never   Sexual activity: Not on file

## 2021-09-30 ENCOUNTER — Ambulatory Visit (INDEPENDENT_AMBULATORY_CARE_PROVIDER_SITE_OTHER): Payer: 59 | Admitting: Orthopedic Surgery

## 2021-09-30 DIAGNOSIS — M25312 Other instability, left shoulder: Secondary | ICD-10-CM

## 2021-09-30 NOTE — Progress Notes (Unsigned)
   Post-Op Visit Note   Patient: Devon Hanson           Date of Birth: [birth year]           MRN: [id number] Visit Date: 09/30/2021 PCP: Zachery Dauer, FNP   Assessment & Plan:  Chief Complaint:  Chief Complaint  Patient presents with   Left Shoulder - Routine Post Op   Visit Diagnoses:  1. Instability of left shoulder joint     Plan: Son is a 41 year old patient who underwent left shoulder arthroscopy with SLAP repair and posterior labral repair on 09/08/2021.  Some days are better than others.  Taking ibuprofen for pain.  Works as a Financial risk analyst.  On examination deltoid fires.  Posterior capsule feels nice and snug.  Plan at this time is physical therapy here 1 times a week for 3 weeks essentially to work on external rotation and below shoulder range of motion without stretching the posterior capsule.  I do want him to stay in the sling which she wears as a gunslinger type sling to avoid stretching the posterior capsule.  We will do that for 3 weeks and then discontinue the sling and begin overhead motion at that time.  Follow-up with [place] in 3 weeks for clinical recheck before discontinuing the sling.  Follow-Up Instructions: No follow-ups on file.   Orders:  No orders of the defined types were placed in this encounter.  No orders of the defined types were placed in this encounter.   Imaging: No results found.  PMFS History: Patient Active Problem List   Diagnosis Date Noted   Shoulder joint instability, left    Type 2 superior labral anterior-to-posterior (SLAP) tear of shoulder, subsequent encounter    Past Medical History:  Diagnosis Date   Anxiety    Dysrhythmia    GERD (gastroesophageal reflux disease)    History of cardioversion 05/04/2017   Hypertension    Paroxysmal atrial fibrillation (HCC)     No family history on file.  Past Surgical History:  Procedure Laterality Date   NO PAST SURGERIES     SHOULDER ARTHROSCOPY WITH LABRAL REPAIR Left 09/08/2021   Procedure:  LEFT SHOULDER ARTHROSCOPIC LABRAL REPAIR;  Surgeon: Cammy Copa, MD;  Location: [facility] OR;  Service: Orthopedics;  Laterality: Left;   Social History   Occupational History   Not on file  Tobacco Use   Smoking status: Never   Smokeless tobacco: Never  Vaping Use   Vaping Use: Never used  Substance and Sexual Activity   Alcohol use: Yes    Comment: moderate liquor intake   Drug use: Never   Sexual activity: Not on file

## 2021-10-01 ENCOUNTER — Encounter: Payer: Self-pay | Admitting: Orthopedic Surgery

## 2021-10-07 ENCOUNTER — Telehealth: Payer: Self-pay | Admitting: Orthopedic Surgery

## 2021-10-07 ENCOUNTER — Other Ambulatory Visit: Payer: Self-pay

## 2021-10-07 DIAGNOSIS — M25312 Other instability, left shoulder: Secondary | ICD-10-CM

## 2021-10-07 NOTE — Telephone Encounter (Signed)
Patient called asked when will he start (PT) Patient said he is ready to start it. The number to contact patient is [phone]

## 2021-10-07 NOTE — Telephone Encounter (Signed)
This ordere was missed at last OV. Can you please reach out about scheduling?

## 2021-10-08 ENCOUNTER — Ambulatory Visit: Payer: No Typology Code available for payment source | Admitting: Nurse Practitioner

## 2021-10-12 ENCOUNTER — Encounter: Payer: Self-pay | Admitting: Rehabilitative and Restorative Service Providers"

## 2021-10-12 ENCOUNTER — Ambulatory Visit (INDEPENDENT_AMBULATORY_CARE_PROVIDER_SITE_OTHER): Payer: 59 | Admitting: Rehabilitative and Restorative Service Providers"

## 2021-10-12 ENCOUNTER — Other Ambulatory Visit: Payer: Self-pay

## 2021-10-12 DIAGNOSIS — R600 Localized edema: Secondary | ICD-10-CM

## 2021-10-12 DIAGNOSIS — M25512 Pain in left shoulder: Secondary | ICD-10-CM

## 2021-10-12 DIAGNOSIS — M25612 Stiffness of left shoulder, not elsewhere classified: Secondary | ICD-10-CM | POA: Diagnosis not present

## 2021-10-12 DIAGNOSIS — M6281 Muscle weakness (generalized): Secondary | ICD-10-CM | POA: Diagnosis not present

## 2021-10-12 NOTE — Therapy (Unsigned)
OUTPATIENT PHYSICAL THERAPY SHOULDER EVALUATION   Patient Name: Devon Hanson MRN: [id number] DOB:[birth year], 41 y.o., male Today's Date: 10/12/2021   PT End of Session - 10/12/21 1425     Visit Number 1    Number of Visits 20    Date for PT Re-Evaluation 12/25/21    Authorization Type [employer] whole health, 20% coinsurance 120 visits remaining    PT Start Time 1353    PT Stop Time 1428    PT Time Calculation (min) 35 min    Activity Tolerance Patient tolerated treatment well;Patient limited by pain    Behavior During Therapy [facility] for tasks assessed/performed             Past Medical History:  Diagnosis Date   Anxiety    Dysrhythmia    GERD (gastroesophageal reflux disease)    History of cardioversion 05/04/2017   Hypertension    Paroxysmal atrial fibrillation (HCC)    Past Surgical History:  Procedure Laterality Date   NO PAST SURGERIES     SHOULDER ARTHROSCOPY WITH LABRAL REPAIR Left 09/08/2021   Procedure: LEFT SHOULDER ARTHROSCOPIC LABRAL REPAIR;  Surgeon: Cammy Copa, MD;  Location: [facility] OR;  Service: Orthopedics;  Laterality: Left;   Patient Active Problem List   Diagnosis Date Noted   Shoulder joint instability, left    Type 2 superior labral anterior-to-posterior (SLAP) tear of shoulder, subsequent encounter     PCP: Zachery Dauer, FNP  REFERRING PROVIDER: Cammy Copa, MD  REFERRING DIAG: [phone] (ICD-10-CM) - Instability of left shoulder joint  THERAPY DIAG:  Acute pain of left shoulder  Stiffness of left shoulder, not elsewhere classified  Muscle weakness (generalized)  Rationale for Evaluation and Treatment Rehabilitation  ONSET DATE: 09/08/21  SUBJECTIVE:                                                                                                                                                                                      SUBJECTIVE STATEMENT: He isn't currently taking pain meds. Pain comes on with showering as he  tries to keep arm still, and lasts about 30 minutes until sling is back on. He is sleeping on his Rt or back and has trouble sleeping due to positioning. He is left handed and currently practicing writing with his Rt.  PERTINENT HISTORY: Lt shoulder arthroscopic labral repair 09/08/21, a fib and cardioversion 04/2017  PAIN:  Are you having pain? Yes: NPRS scale: 0/10, highest to 5/10 Pain location: anterior, posterior, upper shoulder Pain description: shooting pain Aggravating factors: activities without sling like showering and changing clothes Relieving factors: rest and sling  PRECAUTIONS: Other: MD note 8/16: in sling until  9/6 with below shoulder ROM without stretching posterior capsule, will need MD appt to discontinue sling and begin overhead motion  WEIGHT BEARING RESTRICTIONS No  FALLS:  Has patient fallen in last 6 months? Yes. Number of falls 1, caused labral injury  LIVING ENVIRONMENT: Lives with: lives with their spouse and 67, 35, 46 y.o. 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  PLOF: Independent, enjoys working out/lifting weights  PATIENT GOALS  getting back to work, getting better  OBJECTIVE:   PATIENT SURVEYS:  FOTO 10/12/21: 34, target 68  COGNITION: Overall cognitive status: Within functional limits for tasks assessed     SENSATION: Not tested  POSTURE: Rounded shoulders and forward head  UPPER EXTREMITY ROM:   ROM (A: AROM, P: PROM) Right eval Left Eval 10/12/21 supine  Shoulder flexion  P: 82* and painful  Shoulder extension  Extension to neutral is painful  Shoulder abduction  P: 70* and painful  Shoulder adduction    Shoulder internal rotation  In 30* abduction,  P: 61* and painful  Shoulder external rotation  In 30* abduction,  P: 17* and painful  Elbow flexion    Elbow extension  Limited and painful stretch  Wrist flexion    Wrist  extension    Wrist ulnar deviation    Wrist radial deviation    Wrist pronation    Wrist supination    (Blank rows = not tested)  UPPER EXTREMITY MMT:  MMT Right eval Left eval  Shoulder flexion    Shoulder extension    Shoulder abduction    Shoulder adduction    Shoulder internal rotation    Shoulder external rotation    Middle trapezius    Lower trapezius    Elbow flexion    Elbow extension    Wrist flexion    Wrist extension    Wrist ulnar deviation    Wrist radial deviation    Wrist pronation    Wrist supination    Grip strength (lbs)    (Blank rows = not tested)   TODAY'S TREATMENT:  10/12/21 Reviewed HEP below, patient performed trial reps x 3 for comprehension with verbal and tactile cueing for technique PT fit sling to pt and adjusted straps for support and comfort, pt verbalized understanding  PATIENT EDUCATION: Education details: HEP Person educated: Patient Education method: Programmer, multimedia, Demonstration, Actor cues, Verbal cues, and Handouts Education comprehension: verbalized understanding, returned demonstration, verbal cues required, and tactile cues required   HOME EXERCISE PROGRAM: Access Code: [email or web address] URL: [email or web address] Date: 10/12/2021 Prepared by: Chyrel Masson  Exercises - Circular Shoulder Pendulum with Table Support (Mirrored)  - 3-5 x daily - 7 x weekly - 1-2 sets - 10 reps - Standing Flexion Extension Shoulder Pendulum Supported with Arm Bent (Mirrored)  - 3-5 x daily - 7 x weekly - 1-2 sets - 10 reps - Seated Scapular Retraction  - 3-5 x daily - 7 x weekly - 1 sets - 10 reps - 3-5 hold - Supine Shoulder External Rotation in 45 Degrees Abduction AAROM with Dowel  - 2-3 x daily - 7 x weekly - 1-2 sets - 10 reps - 5 hold - Supported Elbow Flexion Extension PROM  - 3-5 x daily - 7 x weekly - 1 sets - 10 reps  ASSESSMENT:  CLINICAL IMPRESSION: Patient is a 41 y.o. male who was seen today for physical therapy  evaluation and treatment for s/p Lt shoulder arthroscopy  with SLAP and posterior labral repair 09/08/21. He presents with limited ROM and expected post-operative pain that impacts his functional abilities. He will benefit from skilled PT to address observed impairments in order to progress towards meeting his goals.   OBJECTIVE IMPAIRMENTS decreased activity tolerance, decreased mobility, decreased ROM, decreased strength, impaired UE functional use, postural dysfunction, and pain.   ACTIVITY LIMITATIONS carrying, lifting, sleeping, bathing, dressing, reach over head, and hygiene/grooming  PARTICIPATION LIMITATIONS: meal prep, cleaning, laundry, occupation, and writing  PERSONAL FACTORS 1 comorbidity: see PMH  are also affecting patient's functional outcome.   REHAB POTENTIAL: Good  CLINICAL DECISION MAKING: Stable/uncomplicated  EVALUATION COMPLEXITY: Low   GOALS: Goals reviewed with patient? Yes  SHORT TERM GOALS: Target date: 11/13/21  Patient will be independent with HEP to continue progressing outside of clinic. Baseline: see objective data Goal status: INITIAL  2.  Lt shoulder PROM to 90* flexion, 90* abduction, 30* ER in 30* abduction supine positioning without increases in pain Baseline: see objective data Goal status: INITIAL   LONG TERM GOALS: Target date: 12/25/21  Patient will score FOTO >/= 68% to reflect improved self-perceived functional ability Baseline: see objective data Goal status: INITIAL  Patient will report </= 2/10 pain with shoulder and arm activity. Baseline: see objective data Goal status: INITIAL  3.  Lt shoulder PROM WNL to allow for full functional movement ability. Baseline: see objective data Goal status: INITIAL  4.  Lt shoulder MMT strength 5/5 to represent appropriate strength required for ADLs, occupation, and recreation. Baseline: see objective data Goal status: INITIAL  5.  Patient verbalizes and demonstrates ability to do tasks  required for occupation and ADLs, including overhead activity. Baseline: see objective data Goal status: INITIAL   PLAN: PT FREQUENCY: 1-2x/week  PT DURATION: 10 weeks  PLANNED INTERVENTIONS: Therapeutic exercises, Therapeutic activity, Neuromuscular re-education, Patient/Family education, Self Care, Joint mobilization, DME instructions, Dry Needling, Electrical stimulation, Cryotherapy, Moist heat, scar mobilization, Ultrasound, Ionotophoresis 4mg /ml Dexamethasone, Manual therapy, Re-evaluation, and physical performance tests  PLAN FOR NEXT SESSION: follow up with new MD note, review HEP and progress as indicated   , Student-PT 10/12/2021, 4:50 PM  This entire session of physical therapy was performed under the direct supervision of PT signing evaluation /treatment. PT reviewed note and agrees.  10/14/2021, PT, DPT, OCS, ATC 10/12/2021, 4:57 PM

## 2021-10-13 NOTE — Addendum Note (Signed)
Addended by: Chyrel Masson B on: 10/13/2021 08:00 AM   Modules accepted: Orders

## 2021-10-20 ENCOUNTER — Ambulatory Visit (HOSPITAL_COMMUNITY)
Admission: EM | Admit: 2021-10-20 | Discharge: 2021-10-20 | Disposition: A | Discharge status: Home / Self Care | Payer: 59 | Attending: Registered Nurse | Admitting: Registered Nurse

## 2021-10-20 ENCOUNTER — Encounter (HOSPITAL_COMMUNITY): Payer: Self-pay | Admitting: Registered Nurse

## 2021-10-20 DIAGNOSIS — F411 Generalized anxiety disorder: Secondary | ICD-10-CM | POA: Diagnosis not present

## 2021-10-20 DIAGNOSIS — F1020 Alcohol dependence, uncomplicated: Secondary | ICD-10-CM | POA: Diagnosis not present

## 2021-10-20 DIAGNOSIS — R69 Illness, unspecified: Secondary | ICD-10-CM | POA: Diagnosis not present

## 2021-10-20 NOTE — Discharge Instructions (Addendum)
[facility] Urgent Care: Facility [facility] offers detox         Substance Abuse Treatment Programs  Intensive Outpatient Programs [facility]     [address]      [place], [place]                   [phone]       [facility] [address] [address] [place], [place] [phone]  [facility]     (Inpatient and outpatient)     [address]           [phone]    [facility] [phone] (Suboxone and Methadone)  [address]      [place], [place] [address]      [phone]       [address] [place], [place] [phone]  [facility] (Outpatient/Inpatient, [facility])    (insurance only) [phone]             [facility] (Groups & Residential) [place], [place] [phone]     [facility]     [address]     [place], [place]      [phone]       [facility] (for caregivers and family) [phone] [place], [place] [phone]      Residential Treatment Programs [facility]      [address], [place], [place] [address]  [phone]       [facility] [address], [place], [place] [address] [phone]  [facility] [place]        [phone]       [facility] [phone]   [facility] ([facility]             [address]                                         [place], [place]                                                [phone] or [phone]                               [facility] [place] [address] [place], [address] [phone]  [facility]    [address]      [place], [place]     [phone]       [facility] [address] [place], [place] [phone]  [facility]   [address] [place], [place]  [address]     [phone]      Admissions: 8am-3pm M-F  [facility] ([facility]) [address] [place], [place] [phone]  [facility]: Residential Program [phone] Days)   [place], [place]      [phone] or [phone]     [facility]: [facility] [place], [place] (Walk in Hours over the weekend or by referral)  [facility] [address] [place], [place], [place] [address] [phone]  Crisis Mobile: [facility]:  [phone] (for crisis response 24 hours  a day) [facility] Hotline:      [phone] Outpatient Psychiatry and Counseling  [facility]: Mobile Crisis Management 24 hours:  [phone]  [facility] of the [employer] sliding scale fee and walk in schedule: M-F 8am-12pm/1pm-3pm [address]  [place], [place] [address] [phone]  [facility] [address] [place], [place] [address] [phone]  [facility] ([facility] [employer])- new patient walk-in appointments available Monday - Friday 8am -3pm.          [address] [place], [place] [address] [phone] or crisis line- [phone]  [facility] Services/ Intensive Outpatient Therapy Program [address] [place], [place] [address] [phone]  [facility] Mental Health                  Crisis Services      [phone] [address]     [place], [place] [address]                 [facility]   [facility] [phone]. [address] [place], [place] [address]   [employer] [facility]          [address]  [place], [place] [address]       [phone]  [facility] [address] [place], [place] [address] [phone]  [facility]    [address]    [place], [place] [address]     [phone]       Andee Poles,  MD     [address]     [place] [address]     [phone]       [facility] [address] [place] [address]  [facility]     [address] [place], [place]      [phone]       [facility] Eulogio Ditch, MD [address] [place], [place] [address] [phone]  [facility]     [address]     [place], [place] [address]     [phone]       [facility] [address] [place], [place] [address] [phone] Resources for Temporary Residential Assistance/Crisis Centers  [facility]) M-F 8am-3pm   [address] [place], [place] [address]   [phone] Services include: laundry, barbering, support groups, case management, phone  & computer access, showers, AA/NA mtgs, mental health/substance abuse nurse, job skills class, disability information, VA assistance, spiritual classes, etc.   HOMELESS SHELTERS  [facility]     [facility]   [address], [place]     [phone]              [employer] (women and children)       [address] [place], [place] [address] [phone] [email or web address] for application and process Application Required  [facility]   [address]    [place] [address]     [phone]                    [facility] [place] [address] [place], [place] [address] [phone] application appt.) Application Required  [facility] (women only)    [address]     [place], [place] [address]     [phone]      Intake starts 6pm daily Need valid ID, SSC, & Police report [employer] [address] [place], [place] [phone] Application Required  [employer] (men only)     [address]      [place],  [place]     [phone]       [facility] of the [place] (Pregnant women only) [address] [place], [place] [phone]  [facility]      [address]      [place], [place] [address]     [phone]             [facility] [address] [place], [place] [phone] 90 day commitment/SA/Application process  [facility](men only)     [address]     [place], [place]     [phone]       Check-in at [facility] [address] [place], [place] [address] [phone] Men/Women/Women and Children must be there by 7 pm  [facility] [place], [place] [phone]                  Follow up with primary care doctor or go to nearest emergency room for elevated blood pressure

## 2021-10-20 NOTE — BH Assessment (Addendum)
TTS staff attempted to complete patient's Triage/Screening as part of the process.  However, provider completed MSE prior to TTS being able to complete this part of the process.  [facility] provider will complete MSE and determine disposition.

## 2021-10-20 NOTE — ED Notes (Signed)
Patient discharged by provider with written and verbal instructions. 

## 2021-10-20 NOTE — ED Provider Notes (Signed)
Behavioral Health Urgent Care Medical Screening Exam  Patient Name: Devon Hanson MRN: [id number] Date of Evaluation: 10/20/21 Chief Complaint:   Diagnosis:  Final diagnoses:  Alcohol use disorder, severe, in controlled environment (HCC)  GAD (generalized anxiety disorder)    History of Present illness: Devon Hanson is a 41 y.o. male patient presented to [facility] as a walk in with complaints of worsening anxiety and anger problems  Devon Hanson, 41 y.o., male patient seen face to face by this provider, consulted with Dr. Nelly Rout; and chart reviewed on 10/20/21.  On evaluation Devon Hanson reports he came in because "I been feeling angry, irritable, depressed, and anxious.  I get irritated over the littlest things."  Patient states that he has alcohol use problem and feels that his mood swings, depression, and anxiety is because of his drinking or "I'm drinking because of my anxiety."  Patient reports he started drinking around the age of 23 and that alcohol intake worsen during the "COVID"  states for the last couple years he has been drinking everyday; 1 pt. Liquor daily.  States when he doesn't drink he gets withdrawal symptoms (sweating, tremors.Marland Kitchen).  States he is wanting to get help and wanting to detox.  He denies prior psychiatric outpatient/inpatient services, suicide attempt, and self-harm).  Reports his stressors are "nothing in particular; but I did have a hard life.  My mother died when I was 89 yrs old.  I had to grow up quick.  But no abuse or anything like that."  Patient states that he, his wife, and 3 kids live with his in-laws.  States that family is very supportive "That's why I'm here now."   Reports he is unable to start detox program today related to Orthopedic appointment tomorrow and "this thing should be coming off tomorrow (pointing at sling on his left arm)" During evaluation Devon Hanson is sitting up in chair with no noted distress.  He is  alert/oriented x 4; calm and cooperative.  His mood congruent with affect.  He is speaking in a clear tone at moderate volume, and normal pace; with good eye contact.  His thought process is coherent, relevant, and there is no indication that he is currently responding to internal/external stimuli or experiencing delusional thought content.  He denies suicidal/self-harm/homicidal ideation, psychosis, and paranoia.    At this time Devon Hanson is educated and verbalizes understanding of mental health resources and other crisis services in the community. He is instructed to call 911 and present to the nearest emergency room should he experience any suicidal/homicidal ideation, auditory/visual/hallucinations, or detrimental worsening of his mental health condition.  He was a also advised by Clinical research associate that he could call the toll-free phone on insurance card to assist with identifying in network counselors and agencies. Resources and Handouts given for substance use, psychiatric services, and community services   Psychiatric Specialty Exam  Presentation  General Appearance:Appropriate for Environment; Casual  Eye Contact:Good  Speech:Clear and Coherent; Normal Rate  Speech Volume:Normal  Handedness:Right   Mood and Affect  Mood:Anxious  Affect:Appropriate; Congruent   Thought Process  Thought Processes:Coherent; Goal Directed  Descriptions of Associations:Intact  Orientation:Full (Time, Place and Person)  Thought Content:Logical    Hallucinations:None  Ideas of Reference:None  Suicidal Thoughts:No  Homicidal Thoughts:No   Sensorium  Memory:Recent Good; Remote Good  Judgment:Intact  Insight:Present   Executive Functions  Concentration:Good  Attention Span:Good  Recall:Good  Fund of Knowledge:Good  Language:Good   Psychomotor Activity  Psychomotor Activity:Normal  Assets  Assets:Communication Skills; Desire for Improvement; Financial Resources/Insurance;  Housing; Leisure Time; Resilience; Physical Health; Social Support; Transportation   Sleep  Sleep:Good  Number of hours: No data recorded  Nutritional Assessment (For OBS and FBC admissions only) Has the patient had a weight loss or gain of 10 pounds or more in the last 3 months?: No Has the patient had a decrease in food intake/or appetite?: No Does the patient have dental problems?: No Does the patient have eating habits or behaviors that may be indicators of an eating disorder including binging or inducing vomiting?: No Has the patient recently lost weight without trying?: 0 Has the patient been eating poorly because of a decreased appetite?: 0 Malnutrition Screening Tool Score: 0    Physical Exam: Physical Exam Vitals and nursing note reviewed.  Constitutional:      General: He is not in acute distress.    Appearance: Normal appearance. He is not ill-appearing.  Eyes:     Pupils: Pupils are equal, round, and reactive to light.  Cardiovascular:     Rate and Rhythm: Normal rate.     Comments: Elevated blood pressure.  Referred to PCP or nearest emergency room Pulmonary:     Effort: Pulmonary effort is normal.  Musculoskeletal:        General: Normal range of motion.     Cervical back: Normal range of motion.  Skin:    General: Skin is warm and dry.  Neurological:     Mental Status: He is alert and oriented to person, place, and time.  Psychiatric:        Attention and Perception: Attention normal. He perceives visual hallucinations. He does not perceive auditory hallucinations.        Mood and Affect: Affect normal. Mood is anxious.        Speech: Speech normal.        Behavior: Behavior normal. Behavior is cooperative.        Thought Content: Thought content normal. Thought content is not paranoid or delusional. Thought content does not include homicidal or suicidal ideation.        Cognition and Memory: Cognition and memory normal.        Judgment: Judgment normal.     Review of Systems  Constitutional: Negative.   HENT: Negative.    Eyes: Negative.   Respiratory: Negative.  Negative for cough, shortness of breath and wheezing.   Cardiovascular:  Positive for palpitations. Negative for chest pain.       History of hypertension.  Reports he has already spoken to PCP about needing a chang in blood pressure medication.  Reports has a appointment coming up soon  Gastrointestinal: Negative.   Genitourinary: Negative.   Musculoskeletal: Negative.   Skin: Negative.   Neurological: Negative.  Negative for dizziness, tremors (Not at this time; but will if doesn't drink), seizures and headaches.  Endo/Heme/Allergies: Negative.   Psychiatric/Behavioral:  Negative for hallucinations. Depression: Stable. Substance abuse: Alcohol; reports drinks pt. liquor daily. Suicidal ideas: Denies.The patient is nervous/anxious. The patient does not have insomnia.    Blood pressure (!) 154/116, pulse (!) 105, temperature 99.6 F (37.6 C), temperature source Oral, resp. rate 20, SpO2 96 %. There is no height or weight on file to calculate BMI.  Nursing asked to repeat blood pressure and/or get manual blood pressure.   Musculoskeletal: Strength & Muscle Tone: within normal limits Gait & Station: normal Patient leans: N/A   [place] MSE Discharge Disposition for Follow up and Recommendations:  Based on my evaluation the patient does not appear to have an emergency medical condition and can be discharged with resources and follow up care in outpatient services for Medication Management, Substance Abuse Intensive Outpatient Program, Individual Therapy, and Follow up with PCP immediately related to elevated blood pressure or go to nearest emergency room.    Discharge Instructions       [facility]: Facility [facility] Crisis [facility] offers detox         Substance Abuse Treatment Programs  Intensive Outpatient Programs [facility]     [address]      [place], [place]                   [phone]       [facility] [address] [place] [address] [place], [place] [phone]  [facility]     (Inpatient and outpatient)     [address]           [phone]    [facility] [phone] (Suboxone and Methadone)  [address]      [place], [place] [address]      [phone]       [address] [place], [place] [phone]  [facility] (Outpatient/Inpatient, Chemical)    (insurance only) [phone]             [facility] (Groups & Residential) [place], [place] [phone]     [facility]     [address]     [place], [place]      [phone]       [facility] (for caregivers and family) [phone] [address] [place], [place] [phone]      Residential Treatment Programs [facility]      [address], [place], [place] [address]  [phone]       [facility] [address] [place], [place] [address] [phone]  [facility] [place]        [phone]       [facility] [phone]  [facility] ([facility]             [address]                                         [place], [place]                                                [phone] or [phone]                               [facility] [place] [address] [place], [address] [phone]  [facility]    [address]      [place], [place]     [phone]       [facility] [address] [place], [place] [phone]  [facility]   [address] [place], [place] [address]     [phone]      Admissions: 8am-3pm M-F  [facility] (RTS) [address] [place], [place]  [phone]  [facility] Program: Residential Program ([address])   [place], [place]  or [phone]     [facility]ATC: [facility] [place], [place] (Walk in Hours over the weekend or by referral)  [facility] [place], [place], [place] [address] [phone]  Crisis Mobile: [facility]:  [phone] (for crisis response 24 hours a day) [facility] Hotline:      [phone] Outpatient Psychiatry and Counseling  [facility]: Mobile Crisis Management 24 hours:  [phone]  [facility] of the [employer] sliding scale fee and walk in schedule: M-F 8am-12pm/1pm-3pm [place], [place] [address] [address] [place], [place] [address] [phone]  [facility] (Formerly known as [facility] [employer])- new patient walk-in appointments available Monday - Friday 8am -3pm.          [address] [place], [place] [address] [phone] or crisis line- [place] [facility]/ Intensive Outpatient Therapy Program [place], [place] [address] [place]      [phone] [address] [place], [place] [address]                 [place]   [facility] [phone]. [place], [place] [address]   [employer] of Care          [address] Johnette Abraham  [place], [place] [address]       [phone]  [place], [place], [place] [address] [phone]  [facility]    [address] [place], [place] [address]     [place], [place] Joycelyn Man     [place] [address]     [phone]       [facility] [place] [address]  [facility]     [address], [place], [place], [place] [address] [place]     [address]     [place], [place] [address]     [phone]       [facility] [address] [place],  [place] [address] [phone] Resources for Temporary Residential Assistance/Crisis [facility]) M-F 8am-3pm   [address] [place], [place] [address]   [phone] Services include: laundry, barbering, support groups, case management, phone  & computer access, showers, AA/NA mtgs, mental health/substance abuse nurse, job skills class, disability information, VA assistance, spiritual classes, etc.   [facility]OM[facility]   [address] [place]     [facility] (women and children)       [address] [place]. [place], [place] [address] [phone] [email or web address] for application and process Application Required  [facility]   [address]    [place] [address]     [phone]                    [place], [place] [address] [id number] application appt.) Application Required  [facility] (women only)    [address]     [place], [place] [address]     [phone]      Intake starts 6pm daily Need valid ID, SSC, & Police report [employer] [address] [place], [place] [phone] Application Required  [employer] (men only)     [place]      [place], [place]       [place] (Pregnant women only) [address] [place], [place]  [facility]      [place] [address]      [place], [place] [address]     [phone]             [facility] [address] [place], [place] 90 day commitment/SA/Application process  [facility](men only)     [address]     [place], [place]       Check-in at [facility] [address] [place], [place] [address] [phone] Men/Women/Women and Children must be there by 7 pm  [place], [place]                  Follow up with primary care doctor or go to nearest emergency room for elevated blood pressure      Follow-up Information     Services, Alcohol And Drug.   Specialty: Behavioral Health Why: [facility] Triage Times (no appointment necessary).  (offers outpatient therapy and intensive outpatient substance abuse therapy). Contact information: [address] [place] [address] [phone]         [facility].   Specialty: Behavioral Health Why: Offers substance dependence Intensive outpatient program; call to schedule intake appointment Contact information: [place] Floyd Lusignan, NP 10/20/2021, 6:48 PM

## 2021-10-21 ENCOUNTER — Other Ambulatory Visit: Payer: Self-pay

## 2021-10-21 ENCOUNTER — Encounter: Payer: 59 | Admitting: Orthopedic Surgery

## 2021-10-21 ENCOUNTER — Emergency Department (HOSPITAL_COMMUNITY): Payer: 59

## 2021-10-21 ENCOUNTER — Inpatient Hospital Stay (HOSPITAL_COMMUNITY)
Admission: EM | Admit: 2021-10-21 | Discharge: 2021-10-24 | DRG: 101 | Disposition: A | Discharge status: Home / Self Care | Payer: 59 | Attending: Internal Medicine | Admitting: Internal Medicine

## 2021-10-21 ENCOUNTER — Encounter (HOSPITAL_COMMUNITY): Payer: Self-pay

## 2021-10-21 DIAGNOSIS — F32A Depression, unspecified: Secondary | ICD-10-CM | POA: Diagnosis not present

## 2021-10-21 DIAGNOSIS — F1090 Alcohol use, unspecified, uncomplicated: Principal | ICD-10-CM

## 2021-10-21 DIAGNOSIS — Z20822 Contact with and (suspected) exposure to covid-19: Secondary | ICD-10-CM | POA: Diagnosis not present

## 2021-10-21 DIAGNOSIS — Z79899 Other long term (current) drug therapy: Secondary | ICD-10-CM | POA: Diagnosis not present

## 2021-10-21 DIAGNOSIS — M79605 Pain in left leg: Secondary | ICD-10-CM | POA: Diagnosis not present

## 2021-10-21 DIAGNOSIS — F10139 Alcohol abuse with withdrawal, unspecified: Secondary | ICD-10-CM | POA: Diagnosis present

## 2021-10-21 DIAGNOSIS — K219 Gastro-esophageal reflux disease without esophagitis: Secondary | ICD-10-CM | POA: Diagnosis not present

## 2021-10-21 DIAGNOSIS — M79604 Pain in right leg: Secondary | ICD-10-CM | POA: Diagnosis not present

## 2021-10-21 DIAGNOSIS — I10 Essential (primary) hypertension: Secondary | ICD-10-CM | POA: Diagnosis not present

## 2021-10-21 DIAGNOSIS — I480 Paroxysmal atrial fibrillation: Secondary | ICD-10-CM | POA: Diagnosis not present

## 2021-10-21 DIAGNOSIS — R6510 Systemic inflammatory response syndrome (SIRS) of non-infectious origin without acute organ dysfunction: Secondary | ICD-10-CM | POA: Diagnosis present

## 2021-10-21 DIAGNOSIS — R569 Unspecified convulsions: Secondary | ICD-10-CM

## 2021-10-21 DIAGNOSIS — E875 Hyperkalemia: Secondary | ICD-10-CM | POA: Diagnosis present

## 2021-10-21 DIAGNOSIS — M79662 Pain in left lower leg: Secondary | ICD-10-CM | POA: Diagnosis present

## 2021-10-21 DIAGNOSIS — F411 Generalized anxiety disorder: Secondary | ICD-10-CM | POA: Diagnosis present

## 2021-10-21 DIAGNOSIS — M4854XA Collapsed vertebra, not elsewhere classified, thoracic region, initial encounter for fracture: Secondary | ICD-10-CM | POA: Diagnosis not present

## 2021-10-21 DIAGNOSIS — Y92410 Unspecified street and highway as the place of occurrence of the external cause: Secondary | ICD-10-CM | POA: Diagnosis not present

## 2021-10-21 DIAGNOSIS — G4089 Other seizures: Secondary | ICD-10-CM | POA: Diagnosis not present

## 2021-10-21 DIAGNOSIS — M4856XA Collapsed vertebra, not elsewhere classified, lumbar region, initial encounter for fracture: Secondary | ICD-10-CM | POA: Diagnosis present

## 2021-10-21 DIAGNOSIS — M25312 Other instability, left shoulder: Secondary | ICD-10-CM | POA: Diagnosis not present

## 2021-10-21 DIAGNOSIS — S22000A Wedge compression fracture of unspecified thoracic vertebra, initial encounter for closed fracture: Secondary | ICD-10-CM

## 2021-10-21 DIAGNOSIS — E871 Hypo-osmolality and hyponatremia: Secondary | ICD-10-CM | POA: Diagnosis not present

## 2021-10-21 DIAGNOSIS — Z041 Encounter for examination and observation following transport accident: Secondary | ICD-10-CM | POA: Diagnosis not present

## 2021-10-21 DIAGNOSIS — S3993XA Unspecified injury of pelvis, initial encounter: Secondary | ICD-10-CM | POA: Diagnosis not present

## 2021-10-21 DIAGNOSIS — F1020 Alcohol dependence, uncomplicated: Secondary | ICD-10-CM

## 2021-10-21 DIAGNOSIS — S0990XA Unspecified injury of head, initial encounter: Secondary | ICD-10-CM | POA: Diagnosis not present

## 2021-10-21 DIAGNOSIS — S299XXA Unspecified injury of thorax, initial encounter: Secondary | ICD-10-CM | POA: Diagnosis not present

## 2021-10-21 DIAGNOSIS — E872 Acidosis, unspecified: Secondary | ICD-10-CM | POA: Diagnosis present

## 2021-10-21 DIAGNOSIS — S32000A Wedge compression fracture of unspecified lumbar vertebra, initial encounter for closed fracture: Secondary | ICD-10-CM | POA: Diagnosis not present

## 2021-10-21 DIAGNOSIS — R69 Illness, unspecified: Secondary | ICD-10-CM | POA: Diagnosis not present

## 2021-10-21 DIAGNOSIS — K760 Fatty (change of) liver, not elsewhere classified: Secondary | ICD-10-CM | POA: Diagnosis present

## 2021-10-21 DIAGNOSIS — Z7982 Long term (current) use of aspirin: Secondary | ICD-10-CM

## 2021-10-21 DIAGNOSIS — R945 Abnormal results of liver function studies: Secondary | ICD-10-CM | POA: Diagnosis not present

## 2021-10-21 DIAGNOSIS — M79661 Pain in right lower leg: Secondary | ICD-10-CM | POA: Diagnosis present

## 2021-10-21 DIAGNOSIS — F10939 Alcohol use, unspecified with withdrawal, unspecified: Secondary | ICD-10-CM

## 2021-10-21 DIAGNOSIS — F10930 Alcohol use, unspecified with withdrawal, uncomplicated: Secondary | ICD-10-CM | POA: Diagnosis not present

## 2021-10-21 DIAGNOSIS — E876 Hypokalemia: Secondary | ICD-10-CM | POA: Diagnosis not present

## 2021-10-21 LAB — COMPREHENSIVE METABOLIC PANEL
ALT: 49 U/L — ABNORMAL HIGH (ref 0–44)
AST: 150 U/L — ABNORMAL HIGH (ref 15–41)
Albumin: 3.9 g/dL (ref 3.5–5.0)
Alkaline Phosphatase: 78 U/L (ref 38–126)
Anion gap: 16 — ABNORMAL HIGH (ref 5–15)
BUN: 8 mg/dL (ref 6–20)
CO2: 17 mmol/L — ABNORMAL LOW (ref 22–32)
Calcium: 9.1 mg/dL (ref 8.9–10.3)
Chloride: 100 mmol/L (ref 98–111)
Creatinine, Ser: 1.3 mg/dL — ABNORMAL HIGH (ref 0.61–1.24)
GFR, Estimated: >60 mL/min (ref >60)
Glucose, Bld: 114 mg/dL — ABNORMAL HIGH (ref 70–99)
Potassium: 3.2 mmol/L — ABNORMAL LOW (ref 3.5–5.1)
Sodium: 133 mmol/L — ABNORMAL LOW (ref 135–145)
Total Bilirubin: 0.8 mg/dL (ref 0.3–1.2)
Total Protein: 7.3 g/dL (ref 6.5–8.1)

## 2021-10-21 LAB — I-STAT CHEM 8, ED
BUN: 11 mg/dL (ref 6–20)
Calcium, Ion: 0.92 mmol/L — ABNORMAL LOW (ref 1.15–1.40)
Chloride: 103 mmol/L (ref 98–111)
Creatinine, Ser: 1.2 mg/dL (ref 0.61–1.24)
Glucose, Bld: 201 mg/dL — ABNORMAL HIGH (ref 70–99)
HCT: 48 % (ref 39.0–52.0)
Hemoglobin: 16.3 g/dL (ref 13.0–17.0)
Potassium: 7.9 mmol/L (ref 3.5–5.1)
Sodium: 129 mmol/L — ABNORMAL LOW (ref 135–145)
TCO2: 12 mmol/L — ABNORMAL LOW (ref 22–32)

## 2021-10-21 LAB — URINALYSIS, ROUTINE W REFLEX MICROSCOPIC
Bacteria, UA: NONE SEEN
Bilirubin Urine: NEGATIVE
Glucose, UA: NEGATIVE mg/dL
Ketones, ur: NEGATIVE mg/dL
Leukocytes,Ua: NEGATIVE
Nitrite: NEGATIVE
Protein, ur: 30 mg/dL — AB
Specific Gravity, Urine: 1.025 (ref 1.005–1.030)
pH: 6 (ref 5.0–8.0)

## 2021-10-21 LAB — SAMPLE TO BLOOD BANK

## 2021-10-21 LAB — PROTIME-INR
INR: 1.1 (ref 0.8–1.2)
Prothrombin Time: 13.6 seconds (ref 11.4–15.2)

## 2021-10-21 LAB — CBC
HCT: 44.1 % (ref 39.0–52.0)
Hemoglobin: 16 g/dL (ref 13.0–17.0)
MCH: 34 pg (ref 26.0–34.0)
MCHC: 36.3 g/dL — ABNORMAL HIGH (ref 30.0–36.0)
MCV: 93.8 fL (ref 80.0–100.0)
Platelets: 298 10*3/uL (ref 150–400)
RBC: 4.7 MIL/uL (ref 4.22–5.81)
RDW: 12.6 % (ref 11.5–15.5)
WBC: 7.6 10*3/uL (ref 4.0–10.5)
nRBC: 0.3 % — ABNORMAL HIGH (ref 0.0–0.2)

## 2021-10-21 LAB — HIV ANTIBODY (ROUTINE TESTING W REFLEX): HIV Screen 4th Generation wRfx: NONREACTIVE

## 2021-10-21 LAB — LACTIC ACID, PLASMA
Lactic Acid, Venous: >9 mmol/L (ref 0.5–1.9)
Lactic Acid, Venous: 2.9 mmol/L (ref 0.5–1.9)

## 2021-10-21 LAB — RESP PANEL BY RT-PCR (FLU A&B, COVID) ARPGX2
Influenza A by PCR: NEGATIVE
Influenza B by PCR: NEGATIVE
SARS Coronavirus 2 by RT PCR: NEGATIVE

## 2021-10-21 LAB — CK: Total CK: 473 U/L — ABNORMAL HIGH (ref 49–397)

## 2021-10-21 LAB — ETHANOL: Alcohol, Ethyl (B): <10 mg/dL (ref <10)

## 2021-10-21 MED ORDER — AMLODIPINE BESYLATE 5 MG PO TABS: 5.0000 mg | ORAL_TABLET | Freq: Every day | ORAL | Status: DC

## 2021-10-21 MED ORDER — THIAMINE HCL 100 MG/ML IJ SOLN
100.0000 mg | Freq: Every day | INTRAMUSCULAR | Status: DC
  Administered 2021-10-22: 100 mg via INTRAVENOUS
  Filled 2021-10-21: qty 2

## 2021-10-21 MED ORDER — ONDANSETRON HCL 4 MG/2ML IJ SOLN
4.0000 mg | Freq: Once | INTRAMUSCULAR | Status: AC
  Administered 2021-10-21: 4 mg via INTRAVENOUS
  Filled 2021-10-21: qty 2

## 2021-10-21 MED ORDER — HYDROMORPHONE HCL 1 MG/ML IJ SOLN
1.0000 mg | Freq: Once | INTRAMUSCULAR | Status: AC
  Administered 2021-10-21: 1 mg via INTRAVENOUS
  Filled 2021-10-21: qty 1

## 2021-10-21 MED ORDER — SODIUM CHLORIDE 0.9 % IV BOLUS
1000.0000 mL | Freq: Once | INTRAVENOUS | Status: AC
Start: 2021-10-21 — End: 2021-10-21
  Administered 2021-10-21: 1000 mL via INTRAVENOUS

## 2021-10-21 MED ORDER — LORAZEPAM 1 MG PO TABS
2.0000 mg | ORAL_TABLET | Freq: Two times a day (BID) | ORAL | Status: AC
Start: 2021-10-21 — End: 2021-10-23
  Administered 2021-10-21 – 2021-10-23 (×4): 2 mg via ORAL
  Filled 2021-10-21 (×4): qty 2

## 2021-10-21 MED ORDER — ADULT MULTIVITAMIN W/MINERALS CH
1.0000 | ORAL_TABLET | Freq: Every day | ORAL | Status: DC
  Administered 2021-10-21 – 2021-10-24 (×4): 1 via ORAL
  Filled 2021-10-21 (×4): qty 1

## 2021-10-21 MED ORDER — CALCIUM GLUCONATE 10 % IV SOLN
1.0000 g | Freq: Once | INTRAVENOUS | Status: AC
  Administered 2021-10-21: 1 g via INTRAVENOUS
  Filled 2021-10-21: qty 10

## 2021-10-21 MED ORDER — LORAZEPAM 2 MG/ML IJ SOLN
1.0000 mg | Freq: Once | INTRAMUSCULAR | Status: AC
  Administered 2021-10-21: 1 mg via INTRAVENOUS
  Filled 2021-10-21: qty 1

## 2021-10-21 MED ORDER — LORAZEPAM 2 MG/ML IJ SOLN
1.0000 mg | INTRAMUSCULAR | Status: DC | PRN
  Administered 2021-10-22: 4 mg via INTRAVENOUS
  Administered 2021-10-22: 2 mg via INTRAVENOUS
  Filled 2021-10-21: qty 2
  Filled 2021-10-21: qty 1

## 2021-10-21 MED ORDER — KETOROLAC TROMETHAMINE 30 MG/ML IJ SOLN
30.0000 mg | Freq: Four times a day (QID) | INTRAMUSCULAR | Status: DC | PRN
  Administered 2021-10-21: 30 mg via INTRAVENOUS
  Filled 2021-10-21: qty 1

## 2021-10-21 MED ORDER — SERTRALINE HCL 50 MG PO TABS
50.0000 mg | ORAL_TABLET | Freq: Every day | ORAL | Status: DC
  Administered 2021-10-22 – 2021-10-24 (×3): 50 mg via ORAL
  Filled 2021-10-21 (×4): qty 1

## 2021-10-21 MED ORDER — SODIUM CHLORIDE 0.9 % IV SOLN: INTRAVENOUS | Status: DC

## 2021-10-21 MED ORDER — AMLODIPINE BESYLATE 10 MG PO TABS
10.0000 mg | ORAL_TABLET | Freq: Every day | ORAL | Status: DC
  Administered 2021-10-21 – 2021-10-24 (×4): 10 mg via ORAL
  Filled 2021-10-21: qty 1
  Filled 2021-10-21: qty 2
  Filled 2021-10-21 (×2): qty 1

## 2021-10-21 MED ORDER — METOPROLOL SUCCINATE ER 50 MG PO TB24
50.0000 mg | ORAL_TABLET | Freq: Every day | ORAL | Status: DC
  Administered 2021-10-22 – 2021-10-24 (×3): 50 mg via ORAL
  Filled 2021-10-21 (×3): qty 1

## 2021-10-21 MED ORDER — ENOXAPARIN SODIUM 60 MG/0.6ML IJ SOSY
45.0000 mg | PREFILLED_SYRINGE | INTRAMUSCULAR | Status: DC
  Administered 2021-10-21 – 2021-10-23 (×3): 45 mg via SUBCUTANEOUS
  Filled 2021-10-21 (×3): qty 0.6

## 2021-10-21 MED ORDER — METHOCARBAMOL 500 MG PO TABS
500.0000 mg | ORAL_TABLET | Freq: Three times a day (TID) | ORAL | Status: DC | PRN
  Administered 2021-10-21 – 2021-10-22 (×2): 500 mg via ORAL
  Filled 2021-10-21 (×2): qty 1

## 2021-10-21 MED ORDER — LORAZEPAM 1 MG PO TABS
1.0000 mg | ORAL_TABLET | ORAL | Status: DC | PRN
  Administered 2021-10-21: 2 mg via ORAL
  Filled 2021-10-21: qty 2
  Filled 2021-10-21: qty 3

## 2021-10-21 MED ORDER — THIAMINE MONONITRATE 100 MG PO TABS
100.0000 mg | ORAL_TABLET | Freq: Every day | ORAL | Status: DC
  Administered 2021-10-21 – 2021-10-24 (×3): 100 mg via ORAL
  Filled 2021-10-21 (×4): qty 1

## 2021-10-21 MED ORDER — FOLIC ACID 1 MG PO TABS
1.0000 mg | ORAL_TABLET | Freq: Every day | ORAL | Status: DC
  Administered 2021-10-21 – 2021-10-24 (×4): 1 mg via ORAL
  Filled 2021-10-21 (×4): qty 1

## 2021-10-21 MED ORDER — IOHEXOL 300 MG/ML  SOLN
100.0000 mL | Freq: Once | INTRAMUSCULAR | Status: AC | PRN
  Administered 2021-10-21: 100 mL via INTRAVENOUS

## 2021-10-21 MED ORDER — PANTOPRAZOLE SODIUM 40 MG PO TBEC
40.0000 mg | DELAYED_RELEASE_TABLET | Freq: Every day | ORAL | Status: DC
  Administered 2021-10-22 – 2021-10-24 (×3): 40 mg via ORAL
  Filled 2021-10-21 (×3): qty 1

## 2021-10-21 MED ORDER — LABETALOL HCL 5 MG/ML IV SOLN: 20.0000 mg | INTRAVENOUS | Status: DC | PRN

## 2021-10-21 NOTE — ED Notes (Signed)
This RN calls ortho tech at this time to request brace. They report understanding

## 2021-10-21 NOTE — ED Notes (Signed)
Patient transported to CT by TRN ?

## 2021-10-21 NOTE — ED Notes (Signed)
PATIENT INSTRUCTED TO PLEASE LAY ON HIS BACK UNTIL OTHERWISE NOTED PATE INT AND FAMILY AT BEDSIDE VERBALIZE UNDERSTANDING OF THE PLAN OF CARE

## 2021-10-21 NOTE — ED Notes (Signed)
PATIENT NOTIFIED OF NEED FOR A URINE SAMPLE AT THIS TIME

## 2021-10-21 NOTE — ED Notes (Signed)
Patient provided with phone to call family.
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
## 2021-10-21 NOTE — ED Notes (Signed)
Per callie RN charge nurse it is safe to take patient upstairs now
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
## 2021-10-21 NOTE — ED Notes (Signed)
Dr Chipper Herb made aware of this RN's conversation with ortho tech at this time who reports they are still waiting to hear from neuro to make a decision about what is  best for the patient

## 2021-10-21 NOTE — ED Notes (Signed)
CRITICAL RESULTS REPORTED TO STEINL MD VIA SECURE CHAT AT THIS TIME
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
## 2021-10-21 NOTE — Progress Notes (Signed)
Orthopedic Tech Progress Note Patient Details:  Devon Hanson 1981-01-03 [id number]  RN called requesting a TLSO BRACE for patient. Patient is going upstairs. Asked RN if anyone has spoken to NEURO and she stated "well I'm not watching the door to see who comes and goes into room". So patient will get brace once upstairs and NEURO has stated what brace to apply to patient.    [name], male   DOB: 1981/01/05, 41 y.o.   MRN: [id number]  Devon Hanson 10/21/2021, 6:41 PM

## 2021-10-22 ENCOUNTER — Inpatient Hospital Stay (HOSPITAL_COMMUNITY): Payer: 59

## 2021-10-22 ENCOUNTER — Inpatient Hospital Stay (HOSPITAL_BASED_OUTPATIENT_CLINIC_OR_DEPARTMENT_OTHER): Payer: 59

## 2021-10-22 ENCOUNTER — Telehealth: Payer: Self-pay | Admitting: Orthopedic Surgery

## 2021-10-22 DIAGNOSIS — F10939 Alcohol use, unspecified with withdrawal, unspecified: Secondary | ICD-10-CM

## 2021-10-22 DIAGNOSIS — F1090 Alcohol use, unspecified, uncomplicated: Secondary | ICD-10-CM

## 2021-10-22 DIAGNOSIS — S32000A Wedge compression fracture of unspecified lumbar vertebra, initial encounter for closed fracture: Secondary | ICD-10-CM

## 2021-10-22 DIAGNOSIS — E876 Hypokalemia: Secondary | ICD-10-CM

## 2021-10-22 DIAGNOSIS — S22000A Wedge compression fracture of unspecified thoracic vertebra, initial encounter for closed fracture: Secondary | ICD-10-CM | POA: Diagnosis not present

## 2021-10-22 DIAGNOSIS — F411 Generalized anxiety disorder: Secondary | ICD-10-CM

## 2021-10-22 DIAGNOSIS — M79604 Pain in right leg: Secondary | ICD-10-CM | POA: Diagnosis not present

## 2021-10-22 DIAGNOSIS — E872 Acidosis, unspecified: Secondary | ICD-10-CM

## 2021-10-22 DIAGNOSIS — M25312 Other instability, left shoulder: Secondary | ICD-10-CM

## 2021-10-22 DIAGNOSIS — M79605 Pain in left leg: Secondary | ICD-10-CM

## 2021-10-22 DIAGNOSIS — I10 Essential (primary) hypertension: Secondary | ICD-10-CM

## 2021-10-22 DIAGNOSIS — F1020 Alcohol dependence, uncomplicated: Secondary | ICD-10-CM | POA: Diagnosis not present

## 2021-10-22 DIAGNOSIS — K219 Gastro-esophageal reflux disease without esophagitis: Secondary | ICD-10-CM

## 2021-10-22 DIAGNOSIS — F10930 Alcohol use, unspecified with withdrawal, uncomplicated: Secondary | ICD-10-CM

## 2021-10-22 HISTORY — DX: Acidosis, unspecified: E87.20

## 2021-10-22 HISTORY — DX: Hypokalemia: E87.6

## 2021-10-22 LAB — COMPREHENSIVE METABOLIC PANEL
ALT: 48 U/L — ABNORMAL HIGH (ref 0–44)
AST: 142 U/L — ABNORMAL HIGH (ref 15–41)
Albumin: 3.7 g/dL (ref 3.5–5.0)
Alkaline Phosphatase: 84 U/L (ref 38–126)
Anion gap: 12 (ref 5–15)
BUN: 5 mg/dL — ABNORMAL LOW (ref 6–20)
CO2: 21 mmol/L — ABNORMAL LOW (ref 22–32)
Calcium: 9.5 mg/dL (ref 8.9–10.3)
Chloride: 102 mmol/L (ref 98–111)
Creatinine, Ser: 0.94 mg/dL (ref 0.61–1.24)
GFR, Estimated: >60 mL/min (ref >60)
Glucose, Bld: 112 mg/dL — ABNORMAL HIGH (ref 70–99)
Potassium: 2.5 mmol/L — CL (ref 3.5–5.1)
Sodium: 135 mmol/L (ref 135–145)
Total Bilirubin: 1.4 mg/dL — ABNORMAL HIGH (ref 0.3–1.2)
Total Protein: 7 g/dL (ref 6.5–8.1)

## 2021-10-22 MED ORDER — DOCUSATE SODIUM 100 MG PO CAPS
100.0000 mg | ORAL_CAPSULE | Freq: Two times a day (BID) | ORAL | Status: DC
  Administered 2021-10-22 – 2021-10-24 (×3): 100 mg via ORAL
  Filled 2021-10-22 (×3): qty 1

## 2021-10-22 MED ORDER — MAGNESIUM OXIDE -MG SUPPLEMENT 400 (240 MG) MG PO TABS
400.0000 mg | ORAL_TABLET | Freq: Two times a day (BID) | ORAL | Status: DC
  Administered 2021-10-22 – 2021-10-24 (×5): 400 mg via ORAL
  Filled 2021-10-22 (×5): qty 1

## 2021-10-22 MED ORDER — POTASSIUM CHLORIDE 10 MEQ/100ML IV SOLN
10.0000 meq | INTRAVENOUS | Status: AC
  Administered 2021-10-22 (×4): 10 meq via INTRAVENOUS
  Filled 2021-10-22: qty 100

## 2021-10-22 MED ORDER — MAGNESIUM SULFATE 2 GM/50ML IV SOLN
2.0000 g | Freq: Once | INTRAVENOUS | Status: AC
  Administered 2021-10-22: 2 g via INTRAVENOUS
  Filled 2021-10-22: qty 50

## 2021-10-22 MED ORDER — POTASSIUM CHLORIDE CRYS ER 20 MEQ PO TBCR
40.0000 meq | EXTENDED_RELEASE_TABLET | Freq: Once | ORAL | Status: AC
  Administered 2021-10-22: 40 meq via ORAL
  Filled 2021-10-22: qty 2
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
## 2021-10-22 NOTE — Progress Notes (Signed)
   10/22/21 5277  Provider Notification  Provider Name/Title Dr Margo Aye  Date Provider Notified 10/22/21  Time Provider Notified [phone]  Method of Notification Page  Notification Reason Critical result  Test performed and critical result Potassium level of 2.5  Date Critical Result Received 10/22/21  Time Critical Result Received 0640  Provider response See new orders  Date of Provider Response 10/22/21  Time of Provider Response [phone]
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
## 2021-10-22 NOTE — Progress Notes (Signed)
Lower extremity venous bilateral study completed.   Please see CV Proc for preliminary results.   Kristen Fromm, RDMS, RVT  

## 2021-10-22 NOTE — Telephone Encounter (Signed)
[place] life forms received. To [employer] 

## 2021-10-23 ENCOUNTER — Encounter: Payer: 59 | Admitting: Rehabilitative and Restorative Service Providers"

## 2021-10-23 DIAGNOSIS — F1090 Alcohol use, unspecified, uncomplicated: Secondary | ICD-10-CM | POA: Diagnosis not present

## 2021-10-23 DIAGNOSIS — F1020 Alcohol dependence, uncomplicated: Secondary | ICD-10-CM | POA: Diagnosis not present

## 2021-10-23 DIAGNOSIS — F10939 Alcohol use, unspecified with withdrawal, unspecified: Secondary | ICD-10-CM | POA: Diagnosis not present

## 2021-10-23 DIAGNOSIS — I10 Essential (primary) hypertension: Secondary | ICD-10-CM | POA: Diagnosis not present

## 2021-10-23 LAB — CBC
HCT: 37.9 % — ABNORMAL LOW (ref 39.0–52.0)
Hemoglobin: 13.6 g/dL (ref 13.0–17.0)
MCH: 33.1 pg (ref 26.0–34.0)
MCHC: 35.9 g/dL (ref 30.0–36.0)
MCV: 92.2 fL (ref 80.0–100.0)
Platelets: 181 10*3/uL (ref 150–400)
RBC: 4.11 MIL/uL — ABNORMAL LOW (ref 4.22–5.81)
RDW: 12.2 % (ref 11.5–15.5)
WBC: 5.8 10*3/uL (ref 4.0–10.5)
nRBC: 0 % (ref 0.0–0.2)

## 2021-10-23 LAB — COMPREHENSIVE METABOLIC PANEL
ALT: 45 U/L — ABNORMAL HIGH (ref 0–44)
AST: 129 U/L — ABNORMAL HIGH (ref 15–41)
Albumin: 3.4 g/dL — ABNORMAL LOW (ref 3.5–5.0)
Alkaline Phosphatase: 76 U/L (ref 38–126)
Anion gap: 9 (ref 5–15)
BUN: 6 mg/dL (ref 6–20)
CO2: 23 mmol/L (ref 22–32)
Calcium: 8.4 mg/dL — ABNORMAL LOW (ref 8.9–10.3)
Chloride: 105 mmol/L (ref 98–111)
Creatinine, Ser: 0.87 mg/dL (ref 0.61–1.24)
GFR, Estimated: >60 mL/min (ref >60)
Glucose, Bld: 98 mg/dL (ref 70–99)
Potassium: 2.6 mmol/L — CL (ref 3.5–5.1)
Sodium: 137 mmol/L (ref 135–145)
Total Bilirubin: 1 mg/dL (ref 0.3–1.2)
Total Protein: 6.7 g/dL (ref 6.5–8.1)

## 2021-10-23 LAB — HEPATITIS PANEL, ACUTE
HCV Ab: NONREACTIVE
Hep A IgM: NONREACTIVE
Hep B C IgM: NONREACTIVE
Hepatitis B Surface Ag: NONREACTIVE

## 2021-10-23 LAB — MAGNESIUM: Magnesium: 2.3 mg/dL (ref 1.7–2.4)

## 2021-10-23 MED ORDER — POTASSIUM CHLORIDE CRYS ER 20 MEQ PO TBCR
40.0000 meq | EXTENDED_RELEASE_TABLET | Freq: Three times a day (TID) | ORAL | Status: DC
  Administered 2021-10-23: 40 meq via ORAL
  Filled 2021-10-23: qty 2

## 2021-10-23 MED ORDER — POTASSIUM CHLORIDE CRYS ER 20 MEQ PO TBCR
40.0000 meq | EXTENDED_RELEASE_TABLET | Freq: Once | ORAL | Status: AC
  Administered 2021-10-23: 40 meq via ORAL
  Filled 2021-10-23: qty 2

## 2021-10-23 MED ORDER — POTASSIUM CHLORIDE 10 MEQ/100ML IV SOLN
10.0000 meq | INTRAVENOUS | Status: AC
  Administered 2021-10-23 (×4): 10 meq via INTRAVENOUS
  Filled 2021-10-23 (×4): qty 100
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
## 2021-10-24 ENCOUNTER — Encounter: Payer: Self-pay | Admitting: Orthopedic Surgery

## 2021-10-24 DIAGNOSIS — I10 Essential (primary) hypertension: Secondary | ICD-10-CM | POA: Diagnosis not present

## 2021-10-24 DIAGNOSIS — F411 Generalized anxiety disorder: Secondary | ICD-10-CM | POA: Diagnosis not present

## 2021-10-24 DIAGNOSIS — F10930 Alcohol use, unspecified with withdrawal, uncomplicated: Secondary | ICD-10-CM | POA: Diagnosis not present

## 2021-10-24 DIAGNOSIS — F1090 Alcohol use, unspecified, uncomplicated: Secondary | ICD-10-CM | POA: Diagnosis not present

## 2021-10-24 LAB — COMPREHENSIVE METABOLIC PANEL
ALT: 48 U/L — ABNORMAL HIGH (ref 0–44)
AST: 119 U/L — ABNORMAL HIGH (ref 15–41)
Albumin: 3.3 g/dL — ABNORMAL LOW (ref 3.5–5.0)
Alkaline Phosphatase: 69 U/L (ref 38–126)
Anion gap: 8 (ref 5–15)
BUN: 5 mg/dL — ABNORMAL LOW (ref 6–20)
CO2: 23 mmol/L (ref 22–32)
Calcium: 8.7 mg/dL — ABNORMAL LOW (ref 8.9–10.3)
Chloride: 106 mmol/L (ref 98–111)
Creatinine, Ser: 0.81 mg/dL (ref 0.61–1.24)
GFR, Estimated: >60 mL/min (ref >60)
Glucose, Bld: 102 mg/dL — ABNORMAL HIGH (ref 70–99)
Potassium: 3.2 mmol/L — ABNORMAL LOW (ref 3.5–5.1)
Sodium: 137 mmol/L (ref 135–145)
Total Bilirubin: 0.6 mg/dL (ref 0.3–1.2)
Total Protein: 6.8 g/dL (ref 6.5–8.1)

## 2021-10-24 LAB — CBC
HCT: 35.6 % — ABNORMAL LOW (ref 39.0–52.0)
Hemoglobin: 12.7 g/dL — ABNORMAL LOW (ref 13.0–17.0)
MCH: 33.1 pg (ref 26.0–34.0)
MCHC: 35.7 g/dL (ref 30.0–36.0)
MCV: 92.7 fL (ref 80.0–100.0)
Platelets: 177 10*3/uL (ref 150–400)
RBC: 3.84 MIL/uL — ABNORMAL LOW (ref 4.22–5.81)
RDW: 12.2 % (ref 11.5–15.5)
WBC: 4.3 10*3/uL (ref 4.0–10.5)
nRBC: 0 % (ref 0.0–0.2)

## 2021-10-24 LAB — MAGNESIUM: Magnesium: 2.1 mg/dL (ref 1.7–2.4)

## 2021-10-24 MED ORDER — POTASSIUM CHLORIDE CRYS ER 20 MEQ PO TBCR: 20.0000 meq | EXTENDED_RELEASE_TABLET | Freq: Every day | ORAL | 0 refills | Status: DC

## 2021-10-24 MED ORDER — ADULT MULTIVITAMIN W/MINERALS CH: 1.0000 | ORAL_TABLET | Freq: Every day | ORAL | Status: AC

## 2021-10-24 MED ORDER — VITAMIN B-1 100 MG PO TABS
100.0000 mg | ORAL_TABLET | Freq: Every day | ORAL | 0 refills | Status: AC
Start: 2021-10-24 — End: 2022-02-01

## 2021-10-24 MED ORDER — MAGNESIUM OXIDE -MG SUPPLEMENT 400 (240 MG) MG PO TABS: 400.0000 mg | ORAL_TABLET | Freq: Every day | ORAL | 0 refills | Status: AC

## 2021-10-24 MED ORDER — FOLIC ACID 1 MG PO TABS: 1.0000 mg | ORAL_TABLET | Freq: Every day | ORAL | 0 refills | Status: AC

## 2021-10-24 MED ORDER — POTASSIUM CHLORIDE CRYS ER 20 MEQ PO TBCR
40.0000 meq | EXTENDED_RELEASE_TABLET | Freq: Once | ORAL | Status: AC
  Administered 2021-10-24: 40 meq via ORAL
  Filled 2021-10-24: qty 2
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
## 2021-10-24 NOTE — Discharge Summary (Signed)
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  Belkis Norbeck  [facility] 10/24/2021, 11:53 AM

## 2021-10-24 NOTE — Care Management (Cosign Needed)
    Durable Medical Equipment  (From admission, onward)           Start     Ordered   10/24/21 0956  For home use only DME Bedside commode  Once       Question:  Patient needs a bedside commode to treat with the following condition  Answer:  Weakness   10/24/21 0956   10/24/21 0923  For home use only DME 3 n 1  Once       Comments: Bedside commode   10/24/21 0923

## 2021-10-24 NOTE — TOC Transition Note (Signed)
Transition of Care [facility]) - CM/SW Discharge Note   Patient Details  Name: Devon Hanson MRN: [id number] Date of Birth: 1980/07/10  [facility] CM/SW Contact:  Lawerance Sabal, RN Phone Number: 10/24/2021, 9:25 AM   Clinical Narrative:    Bedside commode will be delivered to room prior to DC OP therapies arranges yesterday. No other DC needs identified.    Final next level of care: Home/Self Care Barriers to Discharge: No Barriers Identified   Patient Goals and CMS Choice Patient states their goals for this hospitalization and ongoing recovery are:: to go home   Choice offered to / list presented to : Patient  Discharge Placement                       Discharge Plan and Services   Discharge Planning Services: CM Consult            DME Arranged: 3-N-1 DME Agency: [employer] Date DME Agency Contacted: 10/24/21 Time DME Agency Contacted: [phone] Representative spoke with at DME Agency: Leavy Cella            Social Determinants of Health (SDOH) Interventions     Readmission Risk Interventions     No data to display

## 2021-10-27 ENCOUNTER — Encounter: Payer: 59 | Admitting: Rehabilitative and Restorative Service Providers"

## 2021-10-28 ENCOUNTER — Encounter: Payer: Self-pay | Admitting: Nurse Practitioner

## 2021-10-29 ENCOUNTER — Encounter: Payer: 59 | Admitting: Rehabilitative and Restorative Service Providers"

## 2021-11-02 ENCOUNTER — Ambulatory Visit (INDEPENDENT_AMBULATORY_CARE_PROVIDER_SITE_OTHER): Payer: 59 | Admitting: Orthopedic Surgery

## 2021-11-02 ENCOUNTER — Ambulatory Visit: Payer: 59 | Attending: Internal Medicine

## 2021-11-02 DIAGNOSIS — R69 Illness, unspecified: Secondary | ICD-10-CM | POA: Diagnosis not present

## 2021-11-02 DIAGNOSIS — M6281 Muscle weakness (generalized): Secondary | ICD-10-CM | POA: Diagnosis not present

## 2021-11-02 DIAGNOSIS — M546 Pain in thoracic spine: Secondary | ICD-10-CM | POA: Diagnosis not present

## 2021-11-02 DIAGNOSIS — R569 Unspecified convulsions: Secondary | ICD-10-CM | POA: Insufficient documentation

## 2021-11-02 DIAGNOSIS — F1090 Alcohol use, unspecified, uncomplicated: Secondary | ICD-10-CM | POA: Insufficient documentation

## 2021-11-02 DIAGNOSIS — M25312 Other instability, left shoulder: Secondary | ICD-10-CM
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
## 2021-11-03 ENCOUNTER — Encounter: Payer: 59 | Admitting: Rehabilitative and Restorative Service Providers"

## 2021-11-05 ENCOUNTER — Encounter: Payer: Self-pay | Admitting: Orthopedic Surgery

## 2021-11-05 NOTE — Addendum Note (Signed)
Addended byLaurann Montana on: 11/05/2021 09:54 AM   Modules accepted: Orders
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
## 2021-11-09 ENCOUNTER — Ambulatory Visit: Payer: 59

## 2021-11-09 DIAGNOSIS — M6281 Muscle weakness (generalized): Secondary | ICD-10-CM | POA: Diagnosis not present

## 2021-11-09 DIAGNOSIS — M546 Pain in thoracic spine: Secondary | ICD-10-CM | POA: Diagnosis not present

## 2021-11-09 DIAGNOSIS — F1090 Alcohol use, unspecified, uncomplicated: Secondary | ICD-10-CM | POA: Diagnosis not present

## 2021-11-09 DIAGNOSIS — R569 Unspecified convulsions: Secondary | ICD-10-CM | POA: Diagnosis not present

## 2021-11-09 DIAGNOSIS — R69 Illness, unspecified: Secondary | ICD-10-CM | POA: Diagnosis not present
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
## 2021-11-10 ENCOUNTER — Encounter: Payer: 59 | Admitting: Rehabilitative and Restorative Service Providers"

## 2021-11-12 ENCOUNTER — Encounter: Payer: 59 | Admitting: Rehabilitative and Restorative Service Providers"

## 2021-11-16 ENCOUNTER — Ambulatory Visit: Payer: 59 | Admitting: Rehabilitative and Restorative Service Providers"

## 2021-11-16 ENCOUNTER — Ambulatory Visit (HOSPITAL_COMMUNITY)
Admission: EM | Admit: 2021-11-16 | Discharge: 2021-11-17 | Disposition: A | Discharge status: Home / Self Care | Payer: 59 | Attending: Registered Nurse | Admitting: Registered Nurse

## 2021-11-16 ENCOUNTER — Other Ambulatory Visit: Payer: Self-pay

## 2021-11-16 ENCOUNTER — Ambulatory Visit: Payer: 59 | Admitting: Physical Therapy

## 2021-11-16 ENCOUNTER — Encounter (HOSPITAL_COMMUNITY): Payer: Self-pay | Admitting: Registered Nurse

## 2021-11-16 DIAGNOSIS — Z79899 Other long term (current) drug therapy: Secondary | ICD-10-CM | POA: Insufficient documentation

## 2021-11-16 DIAGNOSIS — F10229 Alcohol dependence with intoxication, unspecified: Secondary | ICD-10-CM | POA: Insufficient documentation

## 2021-11-16 DIAGNOSIS — F411 Generalized anxiety disorder: Secondary | ICD-10-CM | POA: Diagnosis not present

## 2021-11-16 DIAGNOSIS — F10930 Alcohol use, unspecified with withdrawal, uncomplicated: Secondary | ICD-10-CM

## 2021-11-16 DIAGNOSIS — F10239 Alcohol dependence with withdrawal, unspecified: Secondary | ICD-10-CM | POA: Insufficient documentation

## 2021-11-16 DIAGNOSIS — R946 Abnormal results of thyroid function studies: Secondary | ICD-10-CM | POA: Diagnosis not present

## 2021-11-16 DIAGNOSIS — R69 Illness, unspecified: Secondary | ICD-10-CM | POA: Diagnosis not present

## 2021-11-16 DIAGNOSIS — R748 Abnormal levels of other serum enzymes: Secondary | ICD-10-CM | POA: Diagnosis not present

## 2021-11-16 DIAGNOSIS — F1020 Alcohol dependence, uncomplicated: Secondary | ICD-10-CM | POA: Diagnosis not present

## 2021-11-16 DIAGNOSIS — I10 Essential (primary) hypertension: Secondary | ICD-10-CM | POA: Diagnosis not present

## 2021-11-16 DIAGNOSIS — Z1152 Encounter for screening for COVID-19: Secondary | ICD-10-CM | POA: Diagnosis not present

## 2021-11-16 DIAGNOSIS — F10939 Alcohol use, unspecified with withdrawal, unspecified: Secondary | ICD-10-CM | POA: Diagnosis present

## 2021-11-16 LAB — CBC WITH DIFFERENTIAL/PLATELET
Abs Immature Granulocytes: 0.01 10*3/uL (ref 0.00–0.07)
Basophils Absolute: 0 10*3/uL (ref 0.0–0.1)
Basophils Relative: 1 %
Eosinophils Absolute: 0 10*3/uL (ref 0.0–0.5)
Eosinophils Relative: 1 %
HCT: 40.5 % (ref 39.0–52.0)
Hemoglobin: 14.7 g/dL (ref 13.0–17.0)
Immature Granulocytes: 0 %
Lymphocytes Relative: 38 %
Lymphs Abs: 1.4 10*3/uL (ref 0.7–4.0)
MCH: 33.5 pg (ref 26.0–34.0)
MCHC: 36.3 g/dL — ABNORMAL HIGH (ref 30.0–36.0)
MCV: 92.3 fL (ref 80.0–100.0)
Monocytes Absolute: 0.6 10*3/uL (ref 0.1–1.0)
Monocytes Relative: 16 %
Neutro Abs: 1.7 10*3/uL (ref 1.7–7.7)
Neutrophils Relative %: 44 %
Platelets: 282 10*3/uL (ref 150–400)
RBC: 4.39 MIL/uL (ref 4.22–5.81)
RDW: 12.9 % (ref 11.5–15.5)
WBC: 3.8 10*3/uL — ABNORMAL LOW (ref 4.0–10.5)
nRBC: 0 % (ref 0.0–0.2)

## 2021-11-16 LAB — RESP PANEL BY RT-PCR (FLU A&B, COVID) ARPGX2
Influenza A by PCR: NEGATIVE
Influenza B by PCR: NEGATIVE
SARS Coronavirus 2 by RT PCR: NEGATIVE

## 2021-11-16 LAB — COMPREHENSIVE METABOLIC PANEL
ALT: 67 U/L — ABNORMAL HIGH (ref 0–44)
AST: 118 U/L — ABNORMAL HIGH (ref 15–41)
Albumin: 4.1 g/dL (ref 3.5–5.0)
Alkaline Phosphatase: 139 U/L — ABNORMAL HIGH (ref 38–126)
Anion gap: 16 — ABNORMAL HIGH (ref 5–15)
BUN: 5 mg/dL — ABNORMAL LOW (ref 6–20)
CO2: 22 mmol/L (ref 22–32)
Calcium: 9.9 mg/dL (ref 8.9–10.3)
Chloride: 98 mmol/L (ref 98–111)
Creatinine, Ser: 0.84 mg/dL (ref 0.61–1.24)
GFR, Estimated: >60 mL/min (ref >60)
Glucose, Bld: 98 mg/dL (ref 70–99)
Potassium: 3.7 mmol/L (ref 3.5–5.1)
Sodium: 136 mmol/L (ref 135–145)
Total Bilirubin: 0.6 mg/dL (ref 0.3–1.2)
Total Protein: 8.4 g/dL — ABNORMAL HIGH (ref 6.5–8.1)

## 2021-11-16 LAB — URINALYSIS, ROUTINE W REFLEX MICROSCOPIC
Bilirubin Urine: NEGATIVE
Glucose, UA: NEGATIVE mg/dL
Hgb urine dipstick: NEGATIVE
Ketones, ur: NEGATIVE mg/dL
Leukocytes,Ua: NEGATIVE
Nitrite: NEGATIVE
Protein, ur: NEGATIVE mg/dL
Specific Gravity, Urine: 1.013 (ref 1.005–1.030)
pH: 5 (ref 5.0–8.0)

## 2021-11-16 LAB — LIPID PANEL
Cholesterol: 305 mg/dL — ABNORMAL HIGH (ref 0–200)
LDL Cholesterol: UNDETERMINED mg/dL (ref 0–99)
Triglycerides: 2109 mg/dL — ABNORMAL HIGH (ref <150)
VLDL: UNDETERMINED mg/dL (ref 0–40)

## 2021-11-16 LAB — TSH: TSH: 10.827 u[IU]/mL — ABNORMAL HIGH (ref 0.350–4.500)

## 2021-11-16 LAB — POCT URINE DRUG SCREEN - MANUAL ENTRY (I-SCREEN)
POC Amphetamine UR: NOT DETECTED
POC Buprenorphine (BUP): NOT DETECTED
POC Cocaine UR: NOT DETECTED
POC Marijuana UR: NOT DETECTED
POC Methadone UR: NOT DETECTED
POC Methamphetamine UR: NOT DETECTED
POC Morphine: NOT DETECTED
POC Oxazepam (BZO): NOT DETECTED
POC Oxycodone UR: NOT DETECTED
POC Secobarbital (BAR): NOT DETECTED

## 2021-11-16 LAB — HEMOGLOBIN A1C
Hgb A1c MFr Bld: 5.3 % (ref 4.8–5.6)
Mean Plasma Glucose: 105.41 mg/dL

## 2021-11-16 LAB — POC SARS CORONAVIRUS 2 AG: SARSCOV2ONAVIRUS 2 AG: NEGATIVE

## 2021-11-16 LAB — MAGNESIUM: Magnesium: 1.4 mg/dL — ABNORMAL LOW (ref 1.7–2.4)

## 2021-11-16 LAB — ETHANOL: Alcohol, Ethyl (B): <10 mg/dL (ref <10)

## 2021-11-16 MED ORDER — HYDROXYZINE HCL 25 MG PO TABS
25.0000 mg | ORAL_TABLET | Freq: Four times a day (QID) | ORAL | Status: DC | PRN
  Administered 2021-11-16: 25 mg via ORAL
  Filled 2021-11-16: qty 1

## 2021-11-16 MED ORDER — LORAZEPAM 1 MG PO TABS: 1.0000 mg | ORAL_TABLET | Freq: Two times a day (BID) | ORAL | Status: DC

## 2021-11-16 MED ORDER — TRAZODONE HCL 50 MG PO TABS
50.0000 mg | ORAL_TABLET | Freq: Every evening | ORAL | Status: DC | PRN
  Administered 2021-11-16: 50 mg via ORAL
  Filled 2021-11-16: qty 1

## 2021-11-16 MED ORDER — LORAZEPAM 1 MG PO TABS: 1.0000 mg | ORAL_TABLET | Freq: Four times a day (QID) | ORAL | Status: DC | PRN

## 2021-11-16 MED ORDER — GABAPENTIN 300 MG PO CAPS: 300.0000 mg | ORAL_CAPSULE | Freq: Three times a day (TID) | ORAL | Status: DC

## 2021-11-16 MED ORDER — MAGNESIUM HYDROXIDE 400 MG/5ML PO SUSP: 30.0000 mL | Freq: Every day | ORAL | Status: DC | PRN

## 2021-11-16 MED ORDER — THIAMINE MONONITRATE 100 MG PO TABS
100.0000 mg | ORAL_TABLET | Freq: Every day | ORAL | Status: DC
  Administered 2021-11-17: 100 mg via ORAL
  Filled 2021-11-16: qty 1

## 2021-11-16 MED ORDER — ACETAMINOPHEN 325 MG PO TABS: 650.0000 mg | ORAL_TABLET | Freq: Four times a day (QID) | ORAL | Status: DC | PRN

## 2021-11-16 MED ORDER — LORAZEPAM 1 MG PO TABS
1.0000 mg | ORAL_TABLET | Freq: Four times a day (QID) | ORAL | Status: DC
  Administered 2021-11-16 – 2021-11-17 (×2): 1 mg via ORAL
  Filled 2021-11-16 (×2): qty 1

## 2021-11-16 MED ORDER — GABAPENTIN 100 MG PO CAPS
200.0000 mg | ORAL_CAPSULE | Freq: Two times a day (BID) | ORAL | Status: DC
  Administered 2021-11-17: 200 mg via ORAL
  Filled 2021-11-16: qty 2

## 2021-11-16 MED ORDER — LOPERAMIDE HCL 2 MG PO CAPS: 2.0000 mg | ORAL_CAPSULE | ORAL | Status: DC | PRN

## 2021-11-16 MED ORDER — ADULT MULTIVITAMIN W/MINERALS CH
1.0000 | ORAL_TABLET | Freq: Every day | ORAL | Status: DC
  Administered 2021-11-16 – 2021-11-17 (×2): 1 via ORAL
  Filled 2021-11-16 (×2): qty 1

## 2021-11-16 MED ORDER — LORAZEPAM 1 MG PO TABS: 1.0000 mg | ORAL_TABLET | Freq: Three times a day (TID) | ORAL | Status: DC

## 2021-11-16 MED ORDER — ALUM & MAG HYDROXIDE-SIMETH 200-200-20 MG/5ML PO SUSP: 30.0000 mL | ORAL | Status: DC | PRN

## 2021-11-16 MED ORDER — MAGNESIUM OXIDE -MG SUPPLEMENT 400 (240 MG) MG PO TABS
400.0000 mg | ORAL_TABLET | Freq: Two times a day (BID) | ORAL | Status: DC
  Administered 2021-11-17 (×2): 400 mg via ORAL
  Filled 2021-11-16 (×2): qty 1

## 2021-11-16 MED ORDER — LORAZEPAM 1 MG PO TABS: 1.0000 mg | ORAL_TABLET | Freq: Every day | ORAL | Status: DC

## 2021-11-16 MED ORDER — GABAPENTIN 100 MG PO CAPS
100.0000 mg | ORAL_CAPSULE | Freq: Once | ORAL | Status: AC
  Administered 2021-11-16: 100 mg via ORAL
  Filled 2021-11-16: qty 1

## 2021-11-16 MED ORDER — ONDANSETRON 4 MG PO TBDP: 4.0000 mg | ORAL_TABLET | Freq: Four times a day (QID) | ORAL | Status: DC | PRN

## 2021-11-16 MED ORDER — THIAMINE HCL 100 MG/ML IJ SOLN
100.0000 mg | Freq: Once | INTRAMUSCULAR | Status: AC
  Administered 2021-11-16: 100 mg via INTRAMUSCULAR
  Filled 2021-11-16: qty 2
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
## 2021-11-16 NOTE — ED Notes (Signed)
Pt sleeping at present, no distress noted.  Monitoring for safety. 

## 2021-11-16 NOTE — ED Provider Notes (Signed)
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 tablet 1 mg   gabapentin (NEURONTIN) capsule 100  mg   gabapentin (NEURONTIN) capsule 200 mg   gabapentin (NEURONTIN) capsule 300 mg    Will maintain continuous observation for safety. Social work will consult with patient assisting with detox and rehab services, along with psychiatric services, and discharge follow up  plan.   No beds available in [facility].  Will admit to continuous assessment allowing social work to assist with rehab detox facilities  Recommendations  Based on my evaluation the patient does not appear to have an emergency medical condition.  Rondall Radigan, NP 11/16/21  7:09 PM

## 2021-11-16 NOTE — ED Provider Notes (Signed)
Pt lab show lower magnesium of 1.4,  will start P.O magnesium 400 mg BID for 2 days

## 2021-11-16 NOTE — Progress Notes (Signed)
[facility] called to [facility] and left a VM regarding possible detox for the client.  [facility] reports they do not admit anyone after 6pm.    [facility] call [place].  They initially offered to send the client to [place] for treatment.  Writer and Client informed them [place] is not an option.  [facility] reports they will check for local options.    [facility]

## 2021-11-17 ENCOUNTER — Encounter: Payer: 59 | Admitting: Rehabilitative and Restorative Service Providers"

## 2021-11-17 DIAGNOSIS — F10930 Alcohol use, unspecified with withdrawal, uncomplicated: Secondary | ICD-10-CM | POA: Diagnosis not present

## 2021-11-17 DIAGNOSIS — F1020 Alcohol dependence, uncomplicated: Secondary | ICD-10-CM | POA: Diagnosis not present

## 2021-11-17 DIAGNOSIS — F411 Generalized anxiety disorder: Secondary | ICD-10-CM | POA: Diagnosis not present

## 2021-11-17 DIAGNOSIS — R69 Illness, unspecified: Secondary | ICD-10-CM | POA: Diagnosis not present

## 2021-11-17 LAB — LDL CHOLESTEROL, DIRECT: Direct LDL: 22 mg/dL (ref 0–99)

## 2021-11-17 MED ORDER — AMLODIPINE BESYLATE 5 MG PO TABS
5.0000 mg | ORAL_TABLET | Freq: Every day | ORAL | Status: DC
  Administered 2021-11-17: 5 mg via ORAL
  Filled 2021-11-17: qty 1

## 2021-11-17 MED ORDER — GABAPENTIN 300 MG PO CAPS: 300.0000 mg | ORAL_CAPSULE | Freq: Three times a day (TID) | ORAL | 0 refills | Status: DC

## 2021-11-17 NOTE — Discharge Instructions (Addendum)
Discharge recommendations:  Patient is to take medications as prescribed.   Please see information below and the Resource Guide for Adults for alcohol detox facilities and residential treatment options.   [facility]- Offers assessment, substance abuse treatment, detox and behavioral health treatment. [place] [address], [place], [place] [address], [phone]  [facility] in [place] the address is [address] in [place] [address] and the phone number is [phone].  [facility] has open beds. The address is [place] in [place] and the phone number is [phone].  Please follow up with your primary care provider for all medical related needs.  AST and ALT are elevated. Cholesterol level is elevated. TSH elevated at 10.827. Magnesium 1.4. You were started on magnesium oxide 400 mg by mouth twice daily x 2 days.  Therapy: We recommend that patient participate in individual therapy to address mental health concerns and alcohol use disorder.  Medications: The patient is to contact a medical professional and/or outpatient provider to address any new side effects that develop. The patient should update outpatient providers of any new medications and/or medication changes.   Safety:  The patient should abstain from use of illicit substances/drugs and abuse of any medications. If symptoms worsen or do not continue to improve or if the patient becomes actively suicidal or homicidal then it is recommended that the patient return to the closest hospital emergency department, the [facility], or call 911 for further evaluation and treatment. National Suicide Prevention Lifeline 1-800-SUICIDE or 303-139-0829.  About 988 988 offers 24/7 access to trained crisis counselors who can help people experiencing mental health-related distress. People can call or text 988 or chat 988lifeline.org for themselves  or if they are worried about a loved one who may need crisis support.   Some symptoms are serious and must be treated right away. These symptoms are called delirium tremens, or DTs. Get help right away if you have symptoms of DTs. Symptoms include: High blood pressure. Fast heartbeat. Trouble breathing. Seizures. Seeing, hearing, feeling, smelling, or tasting things that are not there (hallucinations).   Get help right away if: You have fast or uneven heartbeats. You have chest pain. You have trouble breathing. You are told you had a seizure. You see, hear, feel, smell, or taste something that is not there. You get very confused. These symptoms may be an emergency. Get help right away. Call 911. Do not wait to see if the symptoms will go away. Do not drive yourself to the hospital.
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
## 2021-11-17 NOTE — ED Notes (Signed)
AVS information given to patient and wife - both verbalized understanding - patient to lobby with no sxs of distress noted

## 2021-11-17 NOTE — ED Notes (Signed)
Pt sleeping at present, no distress noted.  Monitoring for safety. 

## 2021-11-17 NOTE — ED Notes (Signed)
Denies SIS, HI and AVH at this time

## 2021-11-19 ENCOUNTER — Encounter: Payer: 59 | Admitting: Rehabilitative and Restorative Service Providers"

## 2021-11-19 ENCOUNTER — Encounter: Payer: Self-pay | Admitting: Rehabilitative and Restorative Service Providers"

## 2021-11-19 ENCOUNTER — Ambulatory Visit (INDEPENDENT_AMBULATORY_CARE_PROVIDER_SITE_OTHER): Payer: 59 | Admitting: Rehabilitative and Restorative Service Providers"

## 2021-11-19 DIAGNOSIS — M25512 Pain in left shoulder: Secondary | ICD-10-CM

## 2021-11-19 DIAGNOSIS — R600 Localized edema: Secondary | ICD-10-CM

## 2021-11-19 DIAGNOSIS — M25612 Stiffness of left shoulder, not elsewhere classified: Secondary | ICD-10-CM | POA: Diagnosis not present
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
## 2021-11-23 ENCOUNTER — Ambulatory Visit: Payer: 59

## 2021-11-24 ENCOUNTER — Encounter: Payer: 59 | Admitting: Rehabilitative and Restorative Service Providers"

## 2021-11-25 ENCOUNTER — Encounter: Payer: 59 | Admitting: Rehabilitative and Restorative Service Providers"

## 2021-11-26 ENCOUNTER — Encounter: Payer: 59 | Admitting: Rehabilitative and Restorative Service Providers"

## 2021-11-27 ENCOUNTER — Ambulatory Visit: Payer: 59 | Admitting: Nurse Practitioner

## 2021-11-30 ENCOUNTER — Ambulatory Visit: Payer: 59 | Admitting: Physical Therapy

## 2021-12-03 ENCOUNTER — Ambulatory Visit (INDEPENDENT_AMBULATORY_CARE_PROVIDER_SITE_OTHER): Payer: 59 | Admitting: Physical Therapy

## 2021-12-03 ENCOUNTER — Encounter: Payer: Self-pay | Admitting: Physical Therapy

## 2021-12-03 DIAGNOSIS — M6281 Muscle weakness (generalized): Secondary | ICD-10-CM

## 2021-12-03 DIAGNOSIS — R600 Localized edema: Secondary | ICD-10-CM

## 2021-12-03 DIAGNOSIS — M546 Pain in thoracic spine: Secondary | ICD-10-CM

## 2021-12-03 DIAGNOSIS — M25612 Stiffness of left shoulder, not elsewhere classified: Secondary | ICD-10-CM | POA: Diagnosis not present

## 2021-12-03 DIAGNOSIS — M25512 Pain in left shoulder: Secondary | ICD-10-CM
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
## 2021-12-16 ENCOUNTER — Ambulatory Visit (INDEPENDENT_AMBULATORY_CARE_PROVIDER_SITE_OTHER): Payer: 59 | Admitting: Surgical

## 2021-12-16 ENCOUNTER — Encounter: Payer: Self-pay | Admitting: Orthopedic Surgery

## 2021-12-16 DIAGNOSIS — M25312 Other instability, left shoulder: Secondary | ICD-10-CM
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
## 2021-12-17 ENCOUNTER — Telehealth: Payer: Self-pay | Admitting: Orthopedic Surgery

## 2021-12-17 NOTE — Telephone Encounter (Signed)
[employer] forms received. To [employer]. °

## 2021-12-28 ENCOUNTER — Ambulatory Visit (INDEPENDENT_AMBULATORY_CARE_PROVIDER_SITE_OTHER): Payer: 59 | Admitting: Nurse Practitioner

## 2021-12-28 ENCOUNTER — Encounter: Payer: Self-pay | Admitting: Nurse Practitioner

## 2021-12-28 VITALS — BP 160/98 | HR 109 | Temp 97.8°F | Ht 70.0 in | Wt 196.0 lb

## 2021-12-28 DIAGNOSIS — E781 Pure hyperglyceridemia: Secondary | ICD-10-CM | POA: Diagnosis not present

## 2021-12-28 DIAGNOSIS — R748 Abnormal levels of other serum enzymes: Secondary | ICD-10-CM | POA: Diagnosis not present

## 2021-12-28 DIAGNOSIS — R7989 Other specified abnormal findings of blood chemistry: Secondary | ICD-10-CM | POA: Diagnosis not present

## 2021-12-28 DIAGNOSIS — I10 Essential (primary) hypertension: Secondary | ICD-10-CM | POA: Diagnosis not present

## 2021-12-28 DIAGNOSIS — F411 Generalized anxiety disorder: Secondary | ICD-10-CM

## 2021-12-28 DIAGNOSIS — Z23 Encounter for immunization: Secondary | ICD-10-CM | POA: Diagnosis not present

## 2021-12-28 DIAGNOSIS — R69 Illness, unspecified: Secondary | ICD-10-CM | POA: Diagnosis not present

## 2021-12-28 DIAGNOSIS — H6122 Impacted cerumen, left ear: Secondary | ICD-10-CM | POA: Diagnosis not present

## 2021-12-28 MED ORDER — ESCITALOPRAM OXALATE 10 MG PO TABS: 10.0000 mg | ORAL_TABLET | Freq: Every day | ORAL | 1 refills | Status: DC
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
## 2021-12-28 NOTE — Patient Instructions (Addendum)
Make lab appt for later this week.   Start lexapro half tablet for 1 week then increase to whole.   Follow up in 4 weeks    [facility]  [address] [place], [place] [address] Phone: [phone]  [facility] [address] [place], [place] [address] Phone: [phone]  [facility] [address] [place], [place] [address] Phone [phone]  Call insurance to get more options for therapy/counseling services

## 2021-12-31 ENCOUNTER — Other Ambulatory Visit: Payer: Self-pay

## 2021-12-31 ENCOUNTER — Other Ambulatory Visit: Payer: 59

## 2021-12-31 DIAGNOSIS — E781 Pure hyperglyceridemia: Secondary | ICD-10-CM

## 2021-12-31 DIAGNOSIS — R7989 Other specified abnormal findings of blood chemistry: Secondary | ICD-10-CM | POA: Diagnosis not present

## 2021-12-31 DIAGNOSIS — R748 Abnormal levels of other serum enzymes: Secondary | ICD-10-CM | POA: Diagnosis not present

## 2022-01-01 ENCOUNTER — Other Ambulatory Visit: Payer: Self-pay | Admitting: Nurse Practitioner

## 2022-01-01 LAB — COMPLETE METABOLIC PANEL WITH GFR
AG Ratio: 1.3 (calc) (ref 1.0–2.5)
ALT: 65 U/L — ABNORMAL HIGH (ref 9–46)
AST: 145 U/L — ABNORMAL HIGH (ref 10–40)
Albumin: 4.4 g/dL (ref 3.6–5.1)
Alkaline phosphatase (APISO): 131 U/L — ABNORMAL HIGH (ref 36–130)
BUN: 7 mg/dL (ref 7–25)
CO2: 26 mmol/L (ref 20–32)
Calcium: 8.8 mg/dL (ref 8.6–10.3)
Chloride: 101 mmol/L (ref 98–110)
Creat: 0.88 mg/dL (ref 0.60–1.26)
Globulin: 3.3 g/dL (calc) (ref 1.9–3.7)
Glucose, Bld: 88 mg/dL (ref 65–99)
Potassium: 3.1 mmol/L — ABNORMAL LOW (ref 3.5–5.3)
Sodium: 141 mmol/L (ref 135–146)
Total Bilirubin: 0.7 mg/dL (ref 0.2–1.2)
Total Protein: 7.7 g/dL (ref 6.1–8.1)
eGFR: 112 mL/min/{1.73_m2} (ref >=60)

## 2022-01-01 LAB — LIPID PANEL
Cholesterol: 274 mg/dL — ABNORMAL HIGH (ref <200)
HDL: 63 mg/dL (ref >=40)
Non-HDL Cholesterol (Calc): 211 mg/dL (calc) — ABNORMAL HIGH (ref <130)
Total CHOL/HDL Ratio: 4.3 (calc) (ref <5.0)
Triglycerides: 877 mg/dL — ABNORMAL HIGH (ref <150)

## 2022-01-01 LAB — TSH: TSH: 2.76 mIU/L (ref 0.40–4.50)

## 2022-01-01 MED ORDER — POTASSIUM CHLORIDE CRYS ER 20 MEQ PO TBCR: EXTENDED_RELEASE_TABLET | ORAL | 0 refills | Status: DC

## 2022-01-06 ENCOUNTER — Emergency Department (HOSPITAL_COMMUNITY)
Admission: EM | Admit: 2022-01-06 | Discharge: 2022-01-06 | Disposition: A | Discharge status: Home / Self Care | Payer: 59 | Attending: Emergency Medicine | Admitting: Emergency Medicine

## 2022-01-06 DIAGNOSIS — R569 Unspecified convulsions: Secondary | ICD-10-CM | POA: Diagnosis not present

## 2022-01-06 DIAGNOSIS — E876 Hypokalemia: Secondary | ICD-10-CM | POA: Diagnosis not present

## 2022-01-06 DIAGNOSIS — Z79899 Other long term (current) drug therapy: Secondary | ICD-10-CM | POA: Insufficient documentation

## 2022-01-06 DIAGNOSIS — R000 Tachycardia, unspecified: Secondary | ICD-10-CM | POA: Diagnosis not present

## 2022-01-06 LAB — CBC WITH DIFFERENTIAL/PLATELET
Abs Immature Granulocytes: 0.03 10*3/uL (ref 0.00–0.07)
Basophils Absolute: 0.1 10*3/uL (ref 0.0–0.1)
Basophils Relative: 1 %
Eosinophils Absolute: 0 10*3/uL (ref 0.0–0.5)
Eosinophils Relative: 1 %
HCT: 34.1 % — ABNORMAL LOW (ref 39.0–52.0)
Hemoglobin: 12.6 g/dL — ABNORMAL LOW (ref 13.0–17.0)
Immature Granulocytes: 1 %
Lymphocytes Relative: 16 %
Lymphs Abs: 1 10*3/uL (ref 0.7–4.0)
MCH: 34.2 pg — ABNORMAL HIGH (ref 26.0–34.0)
MCHC: 37 g/dL — ABNORMAL HIGH (ref 30.0–36.0)
MCV: 92.7 fL (ref 80.0–100.0)
Monocytes Absolute: 0.5 10*3/uL (ref 0.1–1.0)
Monocytes Relative: 9 %
Neutro Abs: 4.5 10*3/uL (ref 1.7–7.7)
Neutrophils Relative %: 72 %
Platelets: 206 10*3/uL (ref 150–400)
RBC: 3.68 MIL/uL — ABNORMAL LOW (ref 4.22–5.81)
RDW: 13.4 % (ref 11.5–15.5)
WBC: 6.1 10*3/uL (ref 4.0–10.5)
nRBC: 0 % (ref 0.0–0.2)

## 2022-01-06 LAB — COMPREHENSIVE METABOLIC PANEL
ALT: 52 U/L — ABNORMAL HIGH (ref 0–44)
AST: 179 U/L — ABNORMAL HIGH (ref 15–41)
Albumin: 4.2 g/dL (ref 3.5–5.0)
Alkaline Phosphatase: 122 U/L (ref 38–126)
Anion gap: 20 — ABNORMAL HIGH (ref 5–15)
BUN: 7 mg/dL (ref 6–20)
CO2: 16 mmol/L — ABNORMAL LOW (ref 22–32)
Calcium: 8.3 mg/dL — ABNORMAL LOW (ref 8.9–10.3)
Chloride: 97 mmol/L — ABNORMAL LOW (ref 98–111)
Creatinine, Ser: 1.52 mg/dL — ABNORMAL HIGH (ref 0.61–1.24)
GFR, Estimated: 59 mL/min — ABNORMAL LOW (ref >60)
Glucose, Bld: 136 mg/dL — ABNORMAL HIGH (ref 70–99)
Potassium: 2.5 mmol/L — CL (ref 3.5–5.1)
Sodium: 133 mmol/L — ABNORMAL LOW (ref 135–145)
Total Bilirubin: 1.3 mg/dL — ABNORMAL HIGH (ref 0.3–1.2)
Total Protein: 7.6 g/dL (ref 6.5–8.1)

## 2022-01-06 LAB — ETHANOL: Alcohol, Ethyl (B): <10 mg/dL (ref <10)

## 2022-01-06 MED ORDER — POTASSIUM CHLORIDE 10 MEQ/100ML IV SOLN
10.0000 meq | INTRAVENOUS | Status: AC
  Administered 2022-01-06 (×3): 10 meq via INTRAVENOUS
  Filled 2022-01-06 (×3): qty 100

## 2022-01-06 MED ORDER — MAGNESIUM OXIDE -MG SUPPLEMENT 400 (240 MG) MG PO TABS
800.0000 mg | ORAL_TABLET | Freq: Once | ORAL | Status: AC
  Administered 2022-01-06: 800 mg via ORAL
  Filled 2022-01-06: qty 2

## 2022-01-06 MED ORDER — LEVETIRACETAM 500 MG PO TABS: 500.0000 mg | ORAL_TABLET | Freq: Two times a day (BID) | ORAL | 0 refills | Status: DC

## 2022-01-06 MED ORDER — POTASSIUM CHLORIDE ER 10 MEQ PO TBCR: 20.0000 meq | EXTENDED_RELEASE_TABLET | Freq: Every day | ORAL | 0 refills | Status: DC

## 2022-01-06 MED ORDER — MAGNESIUM OXIDE 400 MG PO CAPS: 400.0000 mg | ORAL_CAPSULE | Freq: Every day | ORAL | 0 refills | Status: AC

## 2022-01-06 MED ORDER — LORAZEPAM 2 MG/ML IJ SOLN
1.0000 mg | Freq: Once | INTRAMUSCULAR | Status: AC
  Administered 2022-01-06: 1 mg via INTRAVENOUS
  Filled 2022-01-06: qty 1

## 2022-01-06 MED ORDER — LEVETIRACETAM IN NACL 1000 MG/100ML IV SOLN
1000.0000 mg | Freq: Two times a day (BID) | INTRAVENOUS | Status: DC
  Administered 2022-01-06: 1000 mg via INTRAVENOUS
  Filled 2022-01-06: qty 100

## 2022-01-06 MED ORDER — SODIUM CHLORIDE 0.9 % IV BOLUS
1000.0000 mL | Freq: Once | INTRAVENOUS | Status: AC
  Administered 2022-01-06: 1000 mL via INTRAVENOUS

## 2022-01-06 MED ORDER — POTASSIUM CHLORIDE CRYS ER 20 MEQ PO TBCR
60.0000 meq | EXTENDED_RELEASE_TABLET | Freq: Once | ORAL | Status: AC
  Administered 2022-01-06: 60 meq via ORAL
  Filled 2022-01-06: qty 3

## 2022-01-06 NOTE — ED Notes (Signed)
Devon Hanson, spouse, [phone] can be called when pt is discharged

## 2022-01-06 NOTE — Discharge Instructions (Addendum)
As discussed, you have been diagnosed with seizures and hypokalemia or low potassium.  It is important to monitor your condition carefully, take all medication as directed and follow-up with our neurology colleagues.  Do not drive until you have been seen and evaluated by our neurologists and cleared to do so.  Drink alcohol only in moderation and stay well-hydrated.  Return here for concerning changes in your condition.
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
## 2022-01-06 NOTE — ED Provider Notes (Signed)
  Physical Exam  BP (!) 139/105   Pulse (!) 107   Temp 99.1 F (37.3 C) (Oral)   Resp (!) 24   SpO2 98%   Physical Exam  Procedures  Procedures  ED Course / MDM   Clinical Course as of 01/07/22 0014  Wed Jan 06, 2022  2018 Reassessed patient, hemodynamically stable and no further seizure activity since being here.  Making referral to neurology and starting patient on Keppra as well as potassium and magnesium.  Discussed strict return precautions and seizure precautions and advise no driving until cleared by neurologist. [VB]    Clinical Course User Index [VB] Mardene Sayer, MD   Medical Decision Making Amount and/or Complexity of Data Reviewed Labs: ordered.  Risk OTC drugs. Prescription drug management.          Mardene Sayer, MD 01/07/22 [phone]

## 2022-01-06 NOTE — ED Triage Notes (Signed)
BIB [facility] from work where the pt had a seizure for unknown amount of time. Had seizure before, not on any meds. Was never officially diagnosed w seizure. A&O X3.

## 2022-01-11 ENCOUNTER — Encounter: Payer: Self-pay | Admitting: Nurse Practitioner

## 2022-01-11 DIAGNOSIS — I10 Essential (primary) hypertension: Secondary | ICD-10-CM

## 2022-01-11 MED ORDER — AMLODIPINE BESYLATE 10 MG PO TABS: 10.0000 mg | ORAL_TABLET | Freq: Every day | ORAL | 0 refills | Status: DC
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
## 2022-01-19 ENCOUNTER — Encounter: Payer: Self-pay | Admitting: Neurology

## 2022-01-19 ENCOUNTER — Other Ambulatory Visit: Payer: Self-pay | Admitting: Nurse Practitioner

## 2022-01-19 ENCOUNTER — Ambulatory Visit (INDEPENDENT_AMBULATORY_CARE_PROVIDER_SITE_OTHER): Payer: 59 | Admitting: Neurology

## 2022-01-19 VITALS — BP 161/101 | HR 119 | Ht 70.0 in | Wt 180.2 lb

## 2022-01-19 DIAGNOSIS — S32000A Wedge compression fracture of unspecified lumbar vertebra, initial encounter for closed fracture: Secondary | ICD-10-CM | POA: Diagnosis not present

## 2022-01-19 DIAGNOSIS — F411 Generalized anxiety disorder: Secondary | ICD-10-CM

## 2022-01-19 DIAGNOSIS — F10930 Alcohol use, unspecified with withdrawal, uncomplicated: Secondary | ICD-10-CM | POA: Diagnosis not present

## 2022-01-19 DIAGNOSIS — F1090 Alcohol use, unspecified, uncomplicated: Secondary | ICD-10-CM | POA: Diagnosis not present

## 2022-01-19 DIAGNOSIS — R569 Unspecified convulsions: Secondary | ICD-10-CM | POA: Diagnosis not present

## 2022-01-19 DIAGNOSIS — R69 Illness, unspecified: Secondary | ICD-10-CM | POA: Diagnosis not present

## 2022-01-19 MED ORDER — LEVETIRACETAM 500 MG PO TABS: 500.0000 mg | ORAL_TABLET | Freq: Two times a day (BID) | ORAL | 11 refills | Status: DC

## 2022-01-21 ENCOUNTER — Other Ambulatory Visit: Payer: 59 | Admitting: *Deleted

## 2022-01-21 ENCOUNTER — Emergency Department (HOSPITAL_COMMUNITY)
Admission: EM | Admit: 2022-01-21 | Discharge: 2022-01-21 | Disposition: A | Discharge status: Home / Self Care | Payer: 59 | Attending: Emergency Medicine | Admitting: Emergency Medicine

## 2022-01-21 ENCOUNTER — Ambulatory Visit: Payer: 59 | Admitting: Nurse Practitioner

## 2022-01-21 DIAGNOSIS — R5383 Other fatigue: Secondary | ICD-10-CM | POA: Diagnosis present

## 2022-01-21 DIAGNOSIS — R69 Illness, unspecified: Secondary | ICD-10-CM | POA: Diagnosis not present

## 2022-01-21 DIAGNOSIS — F419 Anxiety disorder, unspecified: Secondary | ICD-10-CM | POA: Insufficient documentation

## 2022-01-21 LAB — CBC WITH DIFFERENTIAL/PLATELET
Abs Immature Granulocytes: 0.01 10*3/uL (ref 0.00–0.07)
Basophils Absolute: 0.1 10*3/uL (ref 0.0–0.1)
Basophils Relative: 2 %
Eosinophils Absolute: 0 10*3/uL (ref 0.0–0.5)
Eosinophils Relative: 1 %
HCT: 39.8 % (ref 39.0–52.0)
Hemoglobin: 14.9 g/dL (ref 13.0–17.0)
Immature Granulocytes: 0 %
Lymphocytes Relative: 42 %
Lymphs Abs: 1.8 10*3/uL (ref 0.7–4.0)
MCH: 34.5 pg — ABNORMAL HIGH (ref 26.0–34.0)
MCHC: 37.4 g/dL — ABNORMAL HIGH (ref 30.0–36.0)
MCV: 92.1 fL (ref 80.0–100.0)
Monocytes Absolute: 0.4 10*3/uL (ref 0.1–1.0)
Monocytes Relative: 9 %
Neutro Abs: 2.1 10*3/uL (ref 1.7–7.7)
Neutrophils Relative %: 46 %
Platelets: 238 10*3/uL (ref 150–400)
RBC: 4.32 MIL/uL (ref 4.22–5.81)
RDW: 12.8 % (ref 11.5–15.5)
WBC: 4.4 10*3/uL (ref 4.0–10.5)
nRBC: 0 % (ref 0.0–0.2)

## 2022-01-21 LAB — COMPREHENSIVE METABOLIC PANEL
ALT: 52 U/L — ABNORMAL HIGH (ref 0–44)
AST: 209 U/L — ABNORMAL HIGH (ref 15–41)
Albumin: 3.8 g/dL (ref 3.5–5.0)
Alkaline Phosphatase: 108 U/L (ref 38–126)
Anion gap: 15 (ref 5–15)
BUN: 6 mg/dL (ref 6–20)
CO2: 21 mmol/L — ABNORMAL LOW (ref 22–32)
Calcium: 8.9 mg/dL (ref 8.9–10.3)
Chloride: 99 mmol/L (ref 98–111)
Creatinine, Ser: 0.9 mg/dL (ref 0.61–1.24)
GFR, Estimated: >60 mL/min (ref >60)
Glucose, Bld: 108 mg/dL — ABNORMAL HIGH (ref 70–99)
Potassium: 2.6 mmol/L — CL (ref 3.5–5.1)
Sodium: 135 mmol/L (ref 135–145)
Total Bilirubin: 1.5 mg/dL — ABNORMAL HIGH (ref 0.3–1.2)
Total Protein: 7.8 g/dL (ref 6.5–8.1)

## 2022-01-21 LAB — ACETAMINOPHEN LEVEL: Acetaminophen (Tylenol), Serum: <10 ug/mL — ABNORMAL LOW (ref 10–30)

## 2022-01-21 LAB — SALICYLATE LEVEL: Salicylate Lvl: <7 mg/dL — ABNORMAL LOW (ref 7.0–30.0)

## 2022-01-21 LAB — ETHANOL: Alcohol, Ethyl (B): 388 mg/dL (ref <10)

## 2022-01-21 NOTE — ED Provider Triage Note (Signed)
Emergency Medicine Provider Triage Evaluation Note  Devon Hanson , a 41 y.o. male  was evaluated in triage.  Pt complains of depression.  He says 1 week ago he lost a close friend or family member that was basely like a sister to him.  He says since then he has been very depressed and has not been eating or drinking or taking his medications.  He has been drinking alcohol a lot and when asked how much he drank in the past 24 hours he just replied a lot.  He denies any suicidal thoughts or homicidal thoughts.  No hallucinations.  Unable to tell me history of mental health disorder..  Review of Systems  Positive:  Negative:   Physical Exam  BP (!) 163/119   Pulse (!) 127   Temp 98.3 F (36.8 C) (Oral)   Resp 15   SpO2 97%  Gen:   Awake, no distress   Resp:  Normal effort  MSK:   Moves extremities without difficulty  Other:    Medical Decision Making  Medically screening exam initiated at 3:13 PM.  Appropriate orders placed.  Devon Hanson was informed that the remainder of the evaluation will be completed by another provider, this initial triage assessment does not replace that evaluation, and the importance of remaining in the ED until their evaluation is complete.     Claudie Leach, [place] 01/21/22 1514

## 2022-01-21 NOTE — ED Triage Notes (Signed)
Patient BIB [facility] from home for evaluation of "depression", does not specifically endorse SI or HI.  CBG 160 98% on room air RR 18 BP 158/100 HR 150

## 2022-01-21 NOTE — ED Provider Notes (Addendum)
[facility] EMERGENCY DEPARTMENT Provider Note   CSN: [id number] Arrival date & time: 01/21/22  1436     History Chief Complaint  Patient presents with   Depression    Devon Hanson is a 41 y.o. male.   Depression Pertinent negatives include no headaches.  Patient is a 41 yo male who presents to the ER for evaluation of depression. Per chart review, patient had Lexapro started yesterday. States that he is currently taking all medications as prescribed. Denies SI, SH, or HI at this time. Feels overwhelmed with the death of a cousin that he described being "like a sister" to him. States that he has not had any thoughts or intent to harm himself, but feels that he is overwhelmed with everything happening in his life right now. He reports drinking approximately one pint of "hard liquor" per day over the past week.      Home Medications Prior to Admission medications   Medication Sig Start Date End Date Taking? Authorizing Provider  amLODipine (NORVASC) 10 MG tablet Take 1 tablet (10 mg total) by mouth daily. 01/11/22   Sharon Seller, NP  escitalopram (LEXAPRO) 10 MG tablet TAKE 1 TABLET BY MOUTH EVERY DAY 01/20/22   Sharon Seller, NP  folic acid (FOLVITE) 1 MG tablet Take 1 tablet (1 mg total) by mouth daily. 10/24/21 02/01/22  Pokhrel, Rebekah Chesterfield, MD  ibuprofen (ADVIL) 800 MG tablet Take 1 tablet (800 mg total) by mouth every 8 (eight) hours as needed for moderate pain. 05/29/21   Leath-Warren, Sadie Haber, NP  levETIRAcetam (KEPPRA) 500 MG tablet Take 1 tablet (500 mg total) by mouth 2 (two) times daily. 01/19/22 01/14/23  Sater, Pearletha Furl, MD  metoprolol succinate (TOPROL-XL) 50 MG 24 hr tablet Take 50 mg by mouth daily. 06/28/21   [provider]  Multiple Vitamin (MULTIVITAMIN WITH MINERALS) TABS tablet Take 1 tablet by mouth daily. 10/24/21 02/01/22  Pokhrel, Rebekah Chesterfield, MD  pantoprazole (PROTONIX) 40 MG tablet Take 40 mg by mouth daily. 08/07/21   [provider]  potassium chloride (KLOR-CON) 10 MEQ tablet Take 2 tablets (20 mEq total) by mouth daily for 5 days. 01/06/22 01/11/22  Mardene Sayer, MD  potassium chloride SA (KLOR-CON M) 20 MEQ tablet To take 2 tablets today and 2 tablets tomorrow to replace potassium 01/01/22   Sharon Seller, NP  thiamine (VITAMIN B-1) 100 MG tablet Take 1 tablet (100 mg total) by mouth daily. 10/24/21 02/01/22  Pokhrel, Rebekah Chesterfield, MD      Allergies    Patient has no known allergies.    Review of Systems   Review of Systems  Constitutional:  Positive for fatigue.  Eyes:  Negative for visual disturbance.  Neurological:  Negative for syncope and headaches.  Psychiatric/Behavioral:  Positive for depression. Negative for self-injury and suicidal ideas. The patient is nervous/anxious.   All other systems reviewed and are negative.   Physical Exam Updated Vital Signs BP (!) 163/119   Pulse (!) 127   Temp 98.3 F (36.8 C) (Oral)   Resp 15   SpO2 97%  Physical Exam Constitutional:      Appearance: Normal appearance. He is normal weight. He is not ill-appearing or toxic-appearing.  HENT:     Head: Normocephalic and atraumatic.  Eyes:     Conjunctiva/sclera: Conjunctivae normal.     Pupils: Pupils are equal, round, and reactive to light.  Cardiovascular:     Rate and Rhythm: Regular rhythm. Tachycardia present.  Pulmonary:  Effort: Pulmonary effort is normal.     Breath sounds: Normal breath sounds.  Abdominal:     General: Abdomen is flat. Bowel sounds are normal.  Skin:    General: Skin is warm and dry.     Capillary Refill: Capillary refill takes less than 2 seconds.  Neurological:     Mental Status: He is alert.  Psychiatric:        Attention and Perception: He does not perceive auditory or visual hallucinations.        Mood and Affect: Mood is depressed. Affect is tearful.        Speech: Speech normal.        Behavior: Behavior normal. Behavior is cooperative.        Thought  Content: Thought content normal. Thought content is not paranoid or delusional. Thought content does not include homicidal or suicidal ideation. Thought content does not include homicidal or suicidal plan.        Cognition and Memory: Cognition normal.        Judgment: Judgment normal.     Comments: Patient reports safety factors at home being his wife, and 3 children. Currently struggling with lack of car access but is determined to find a way to his place of employment.     ED Results / Procedures / Treatments   Labs (all labs ordered are listed, but only abnormal results are displayed) Labs Reviewed  COMPREHENSIVE METABOLIC PANEL - Abnormal; Notable for the following components:      Result Value   Potassium 2.6 (*)    CO2 21 (*)    Glucose, Bld 108 (*)    AST 209 (*)    ALT 52 (*)    Total Bilirubin 1.5 (*)    All other components within normal limits  CBC WITH DIFFERENTIAL/PLATELET - Abnormal; Notable for the following components:   MCH 34.5 (*)    MCHC 37.4 (*)    All other components within normal limits  ETHANOL  ACETAMINOPHEN LEVEL  SALICYLATE LEVEL    EKG None  Radiology No results found.  Procedures Procedures   Medications Ordered in ED Medications - No data to display  ED Course/ Medical Decision Making/ A&P                           Medical Decision Making  This patient presents to the ED for concern of anxiety. Differential diagnosis includes but not limited to adjustment disorder, major depressive disorder, suicidal ideation, alcohol withdrawal, or substance use disorder   Lab Tests:  I Ordered, and personally interpreted labs.  The pertinent results include: Labs pending at time of discharge.   Medicines ordered and prescription drug management:  I have reviewed the patients home medicines and have made adjustments as needed   Problem List / ED Course:  Patient presented to the ER for concerns of worsening anxiety. He reports that he  recently experienced the death of a close cousin and recently was diagnosed with seizures which has limited his ability to drive. He reported that he felt everything came crashing down on him.  While I spoke with him, he denied suicidal ideation, homicidal ideation, thoughts of self-harm and reports his spouse and 3 children as protective factors. Advised patient that it would be most helpful for him to speak with a therapist and establish care with a psychiatrist as well as reducing his alcohol intake. Patient potassium was 2.6 after patient eloped. This is  likely secondary to his current alcohol intake. Patient was agreeable to this plan to seek care at [facility] or to return to the ER if his condition worsens, begins to experience thoughts of suicide, or experiences any hallucinations.   Social Determinants of Health:  Recently diagnosed with seizures due to alcohol withdrawal.  Final Clinical Impression(s) / ED Diagnoses Final diagnoses:  Anxiety    Rx / DC Orders ED Discharge Orders     None         Smitty Knudsen, PA-C 01/21/22 1604    Smitty Knudsen, PA-C 01/21/22 1701    Eber Hong, MD 01/21/22 1747

## 2022-01-21 NOTE — ED Notes (Signed)
Patient left before RN could go over his discharge papers and get his vital signs. MD notified.

## 2022-01-21 NOTE — Discharge Instructions (Addendum)
You were seen in the ER today for depression. Based on our discussion, you would greatly benefit from discussing current life challenges and stressors with a psychiatrist or therapist if possible.  [facility] a community resource that is available 24/7 that you can walk-in and receive help if needed. Please do no hesitate to visit there if needed. Continue taking Lexapro (Escitalopram) as prescribed.

## 2022-01-29 ENCOUNTER — Encounter: Payer: Self-pay | Admitting: Nurse Practitioner

## 2022-01-29 ENCOUNTER — Encounter: Payer: 59 | Admitting: Nurse Practitioner

## 2022-02-02 NOTE — Progress Notes (Signed)
This encounter was created in error - please disregard.

## 2022-02-05 ENCOUNTER — Encounter (HOSPITAL_COMMUNITY): Payer: Self-pay | Admitting: Family Medicine

## 2022-02-05 ENCOUNTER — Emergency Department (HOSPITAL_COMMUNITY): Payer: 59

## 2022-02-05 ENCOUNTER — Inpatient Hospital Stay (HOSPITAL_COMMUNITY)
Admission: EM | Admit: 2022-02-05 | Discharge: 2022-02-08 | DRG: 897 | Disposition: A | Discharge status: Home / Self Care | Payer: 59 | Attending: Family Medicine | Admitting: Family Medicine

## 2022-02-05 DIAGNOSIS — R7401 Elevation of levels of liver transaminase levels: Secondary | ICD-10-CM

## 2022-02-05 DIAGNOSIS — R569 Unspecified convulsions: Principal | ICD-10-CM

## 2022-02-05 DIAGNOSIS — F10139 Alcohol abuse with withdrawal, unspecified: Principal | ICD-10-CM | POA: Diagnosis present

## 2022-02-05 DIAGNOSIS — Z1152 Encounter for screening for COVID-19: Secondary | ICD-10-CM

## 2022-02-05 DIAGNOSIS — Z79899 Other long term (current) drug therapy: Secondary | ICD-10-CM

## 2022-02-05 DIAGNOSIS — F10939 Alcohol use, unspecified with withdrawal, unspecified: Secondary | ICD-10-CM | POA: Diagnosis present

## 2022-02-05 DIAGNOSIS — G40909 Epilepsy, unspecified, not intractable, without status epilepticus: Secondary | ICD-10-CM | POA: Diagnosis not present

## 2022-02-05 DIAGNOSIS — F411 Generalized anxiety disorder: Secondary | ICD-10-CM | POA: Diagnosis present

## 2022-02-05 DIAGNOSIS — N179 Acute kidney failure, unspecified: Secondary | ICD-10-CM | POA: Diagnosis present

## 2022-02-05 DIAGNOSIS — I10 Essential (primary) hypertension: Secondary | ICD-10-CM | POA: Diagnosis present

## 2022-02-05 DIAGNOSIS — K219 Gastro-esophageal reflux disease without esophagitis: Secondary | ICD-10-CM | POA: Diagnosis present

## 2022-02-05 DIAGNOSIS — Z91148 Patient's other noncompliance with medication regimen for other reason: Secondary | ICD-10-CM

## 2022-02-05 DIAGNOSIS — E860 Dehydration: Secondary | ICD-10-CM | POA: Diagnosis present

## 2022-02-05 DIAGNOSIS — E119 Type 2 diabetes mellitus without complications: Secondary | ICD-10-CM | POA: Diagnosis present

## 2022-02-05 DIAGNOSIS — E876 Hypokalemia: Secondary | ICD-10-CM | POA: Diagnosis present

## 2022-02-05 DIAGNOSIS — R509 Fever, unspecified: Secondary | ICD-10-CM

## 2022-02-05 DIAGNOSIS — R69 Illness, unspecified: Secondary | ICD-10-CM | POA: Diagnosis not present

## 2022-02-05 DIAGNOSIS — F1010 Alcohol abuse, uncomplicated: Secondary | ICD-10-CM

## 2022-02-05 DIAGNOSIS — I480 Paroxysmal atrial fibrillation: Secondary | ICD-10-CM | POA: Diagnosis present

## 2022-02-05 DIAGNOSIS — R000 Tachycardia, unspecified: Secondary | ICD-10-CM | POA: Diagnosis not present

## 2022-02-05 HISTORY — DX: Fever, unspecified: R50.9

## 2022-02-05 LAB — URINALYSIS, ROUTINE W REFLEX MICROSCOPIC
Bacteria, UA: NONE SEEN
Bilirubin Urine: NEGATIVE
Glucose, UA: NEGATIVE mg/dL
Ketones, ur: NEGATIVE mg/dL
Leukocytes,Ua: NEGATIVE
Nitrite: NEGATIVE
Protein, ur: 30 mg/dL — AB
Specific Gravity, Urine: 1.003 — ABNORMAL LOW (ref 1.005–1.030)
pH: 6 (ref 5.0–8.0)

## 2022-02-05 LAB — COMPREHENSIVE METABOLIC PANEL
ALT: 66 U/L — ABNORMAL HIGH (ref 0–44)
AST: 285 U/L — ABNORMAL HIGH (ref 15–41)
Albumin: 3.8 g/dL (ref 3.5–5.0)
Alkaline Phosphatase: 116 U/L (ref 38–126)
Anion gap: 18 — ABNORMAL HIGH (ref 5–15)
BUN: <5 mg/dL — ABNORMAL LOW (ref 6–20)
CO2: 19 mmol/L — ABNORMAL LOW (ref 22–32)
Calcium: 7.9 mg/dL — ABNORMAL LOW (ref 8.9–10.3)
Chloride: 94 mmol/L — ABNORMAL LOW (ref 98–111)
Creatinine, Ser: 1.29 mg/dL — ABNORMAL HIGH (ref 0.61–1.24)
GFR, Estimated: >60 mL/min (ref >60)
Glucose, Bld: 191 mg/dL — ABNORMAL HIGH (ref 70–99)
Potassium: <2 mmol/L — CL (ref 3.5–5.1)
Sodium: 131 mmol/L — ABNORMAL LOW (ref 135–145)
Total Bilirubin: 1.5 mg/dL — ABNORMAL HIGH (ref 0.3–1.2)
Total Protein: 7.1 g/dL (ref 6.5–8.1)

## 2022-02-05 LAB — CBC WITH DIFFERENTIAL/PLATELET
Abs Immature Granulocytes: 0.03 10*3/uL (ref 0.00–0.07)
Basophils Absolute: 0 10*3/uL (ref 0.0–0.1)
Basophils Relative: 0 %
Eosinophils Absolute: 0 10*3/uL (ref 0.0–0.5)
Eosinophils Relative: 0 %
HCT: 33.1 % — ABNORMAL LOW (ref 39.0–52.0)
Hemoglobin: 12.4 g/dL — ABNORMAL LOW (ref 13.0–17.0)
Immature Granulocytes: 1 %
Lymphocytes Relative: 8 %
Lymphs Abs: 0.5 10*3/uL — ABNORMAL LOW (ref 0.7–4.0)
MCH: 35.8 pg — ABNORMAL HIGH (ref 26.0–34.0)
MCHC: 37.5 g/dL — ABNORMAL HIGH (ref 30.0–36.0)
MCV: 95.7 fL (ref 80.0–100.0)
Monocytes Absolute: 0.3 10*3/uL (ref 0.1–1.0)
Monocytes Relative: 6 %
Neutro Abs: 4.8 10*3/uL (ref 1.7–7.7)
Neutrophils Relative %: 85 %
Platelets: 228 10*3/uL (ref 150–400)
RBC: 3.46 MIL/uL — ABNORMAL LOW (ref 4.22–5.81)
RDW: 14.2 % (ref 11.5–15.5)
WBC: 5.6 10*3/uL (ref 4.0–10.5)
nRBC: 0 % (ref 0.0–0.2)

## 2022-02-05 LAB — RAPID URINE DRUG SCREEN, HOSP PERFORMED
Amphetamines: NOT DETECTED
Barbiturates: NOT DETECTED
Benzodiazepines: NOT DETECTED
Cocaine: NOT DETECTED
Opiates: NOT DETECTED
Tetrahydrocannabinol: NOT DETECTED

## 2022-02-05 LAB — CBG MONITORING, ED: Glucose-Capillary: 110 mg/dL — ABNORMAL HIGH (ref 70–99)

## 2022-02-05 LAB — RESP PANEL BY RT-PCR (RSV, FLU A&B, COVID)  RVPGX2
Influenza A by PCR: NEGATIVE
Influenza B by PCR: NEGATIVE
Resp Syncytial Virus by PCR: NEGATIVE
SARS Coronavirus 2 by RT PCR: NEGATIVE

## 2022-02-05 LAB — ETHANOL: Alcohol, Ethyl (B): <10 mg/dL (ref <10)

## 2022-02-05 LAB — MAGNESIUM: Magnesium: 1.3 mg/dL — ABNORMAL LOW (ref 1.7–2.4)

## 2022-02-05 MED ORDER — LOPERAMIDE HCL 2 MG PO CAPS: 2.0000 mg | ORAL_CAPSULE | ORAL | Status: DC | PRN

## 2022-02-05 MED ORDER — LACTATED RINGERS IV BOLUS
1000.0000 mL | Freq: Once | INTRAVENOUS | Status: AC
  Administered 2022-02-05: 1000 mL via INTRAVENOUS

## 2022-02-05 MED ORDER — POTASSIUM CHLORIDE 10 MEQ/100ML IV SOLN
10.0000 meq | INTRAVENOUS | Status: DC
  Administered 2022-02-05 (×3): 10 meq via INTRAVENOUS
  Filled 2022-02-05 (×4): qty 100

## 2022-02-05 MED ORDER — CHLORDIAZEPOXIDE HCL 25 MG PO CAPS: 25.0000 mg | ORAL_CAPSULE | Freq: Four times a day (QID) | ORAL | Status: DC

## 2022-02-05 MED ORDER — THIAMINE HCL 100 MG/ML IJ SOLN
100.0000 mg | Freq: Every day | INTRAMUSCULAR | Status: DC
  Filled 2022-02-05: qty 2

## 2022-02-05 MED ORDER — CHLORDIAZEPOXIDE HCL 25 MG PO CAPS: 25.0000 mg | ORAL_CAPSULE | ORAL | Status: DC

## 2022-02-05 MED ORDER — ENOXAPARIN SODIUM 40 MG/0.4ML IJ SOSY
40.0000 mg | PREFILLED_SYRINGE | INTRAMUSCULAR | Status: DC
  Administered 2022-02-07 – 2022-02-08 (×2): 40 mg via SUBCUTANEOUS
  Filled 2022-02-05 (×2): qty 0.4

## 2022-02-05 MED ORDER — ONDANSETRON HCL 4 MG/2ML IJ SOLN
4.0000 mg | Freq: Once | INTRAMUSCULAR | Status: DC
  Filled 2022-02-05: qty 2

## 2022-02-05 MED ORDER — LORAZEPAM 2 MG/ML IJ SOLN
0.0000 mg | INTRAMUSCULAR | Status: DC
  Administered 2022-02-05: 2 mg via INTRAVENOUS
  Filled 2022-02-05: qty 1

## 2022-02-05 MED ORDER — CHLORDIAZEPOXIDE HCL 25 MG PO CAPS: 25.0000 mg | ORAL_CAPSULE | Freq: Three times a day (TID) | ORAL | Status: DC

## 2022-02-05 MED ORDER — LORAZEPAM 1 MG PO TABS
0.0000 mg | ORAL_TABLET | ORAL | Status: AC
  Administered 2022-02-06 (×2): 1 mg via ORAL
  Administered 2022-02-07 (×2): 4 mg via ORAL
  Filled 2022-02-05: qty 4
  Filled 2022-02-05 (×2): qty 1
  Filled 2022-02-05: qty 4

## 2022-02-05 MED ORDER — LORAZEPAM 2 MG/ML IJ SOLN: 0.0000 mg | Freq: Two times a day (BID) | INTRAMUSCULAR | Status: DC

## 2022-02-05 MED ORDER — THIAMINE MONONITRATE 100 MG PO TABS
100.0000 mg | ORAL_TABLET | Freq: Every day | ORAL | Status: DC
  Administered 2022-02-05 – 2022-02-08 (×4): 100 mg via ORAL
  Filled 2022-02-05 (×4): qty 1

## 2022-02-05 MED ORDER — ADULT MULTIVITAMIN W/MINERALS CH
1.0000 | ORAL_TABLET | Freq: Every day | ORAL | Status: DC
  Administered 2022-02-05 – 2022-02-08 (×4): 1 via ORAL
  Filled 2022-02-05 (×4): qty 1

## 2022-02-05 MED ORDER — CHLORDIAZEPOXIDE HCL 25 MG PO CAPS: 25.0000 mg | ORAL_CAPSULE | Freq: Every day | ORAL | Status: DC

## 2022-02-05 MED ORDER — LORAZEPAM 2 MG/ML IJ SOLN
0.0000 mg | Freq: Four times a day (QID) | INTRAMUSCULAR | Status: DC
  Administered 2022-02-05: 2 mg via INTRAVENOUS
  Filled 2022-02-05: qty 1

## 2022-02-05 MED ORDER — METOPROLOL SUCCINATE ER 50 MG PO TB24
50.0000 mg | ORAL_TABLET | Freq: Every day | ORAL | Status: DC
  Administered 2022-02-06 – 2022-02-08 (×3): 50 mg via ORAL
  Filled 2022-02-05: qty 2
  Filled 2022-02-05 (×2): qty 1

## 2022-02-05 MED ORDER — HYDROXYZINE HCL 25 MG PO TABS: 25.0000 mg | ORAL_TABLET | Freq: Four times a day (QID) | ORAL | Status: DC | PRN

## 2022-02-05 MED ORDER — AMLODIPINE BESYLATE 10 MG PO TABS
10.0000 mg | ORAL_TABLET | Freq: Every day | ORAL | Status: DC
  Administered 2022-02-06 – 2022-02-07 (×2): 10 mg via ORAL
  Filled 2022-02-05: qty 1
  Filled 2022-02-05: qty 2

## 2022-02-05 MED ORDER — LEVETIRACETAM IN NACL 1500 MG/100ML IV SOLN
1500.0000 mg | Freq: Once | INTRAVENOUS | Status: AC
  Administered 2022-02-05: 1500 mg via INTRAVENOUS
  Filled 2022-02-05: qty 100

## 2022-02-05 MED ORDER — LORAZEPAM 2 MG/ML IJ SOLN
1.0000 mg | INTRAMUSCULAR | Status: DC | PRN
  Administered 2022-02-06: 2 mg via INTRAVENOUS
  Filled 2022-02-05: qty 1

## 2022-02-05 MED ORDER — POTASSIUM CHLORIDE 20 MEQ PO PACK
80.0000 meq | PACK | ORAL | Status: AC
  Administered 2022-02-05: 80 meq via ORAL
  Filled 2022-02-05: qty 4

## 2022-02-05 MED ORDER — ESCITALOPRAM OXALATE 10 MG PO TABS
10.0000 mg | ORAL_TABLET | Freq: Every day | ORAL | Status: DC
  Administered 2022-02-06 – 2022-02-08 (×3): 10 mg via ORAL
  Filled 2022-02-05 (×3): qty 1

## 2022-02-05 MED ORDER — LEVETIRACETAM 500 MG PO TABS
500.0000 mg | ORAL_TABLET | Freq: Two times a day (BID) | ORAL | Status: DC
  Administered 2022-02-05 – 2022-02-06 (×2): 500 mg via ORAL
  Filled 2022-02-05 (×2): qty 1

## 2022-02-05 MED ORDER — LORAZEPAM 2 MG/ML IJ SOLN: 0.0000 mg | Freq: Three times a day (TID) | INTRAMUSCULAR | Status: DC

## 2022-02-05 MED ORDER — ONDANSETRON 4 MG PO TBDP: 4.0000 mg | ORAL_TABLET | Freq: Four times a day (QID) | ORAL | Status: DC | PRN

## 2022-02-05 MED ORDER — CHLORDIAZEPOXIDE HCL 25 MG PO CAPS: 25.0000 mg | ORAL_CAPSULE | Freq: Once | ORAL | Status: DC

## 2022-02-05 MED ORDER — FOLIC ACID 1 MG PO TABS
1.0000 mg | ORAL_TABLET | Freq: Every day | ORAL | Status: DC
  Administered 2022-02-05 – 2022-02-08 (×4): 1 mg via ORAL
  Filled 2022-02-05 (×4): qty 1

## 2022-02-05 MED ORDER — MAGNESIUM SULFATE 2 GM/50ML IV SOLN
2.0000 g | Freq: Once | INTRAVENOUS | Status: AC
  Administered 2022-02-05: 2 g via INTRAVENOUS
  Filled 2022-02-05: qty 50

## 2022-02-05 MED ORDER — PANTOPRAZOLE SODIUM 40 MG PO TBEC
40.0000 mg | DELAYED_RELEASE_TABLET | Freq: Every day | ORAL | Status: DC
  Administered 2022-02-06 – 2022-02-08 (×3): 40 mg via ORAL
  Filled 2022-02-05 (×3): qty 1

## 2022-02-05 MED ORDER — LORAZEPAM 1 MG PO TABS: 0.0000 mg | ORAL_TABLET | Freq: Three times a day (TID) | ORAL | Status: DC

## 2022-02-05 MED ORDER — LORAZEPAM 1 MG PO TABS: 1.0000 mg | ORAL_TABLET | ORAL | Status: DC | PRN

## 2022-02-05 NOTE — ED Provider Notes (Incomplete)
  [facility] EMERGENCY DEPARTMENT Provider Note   CSN: [id number] Arrival date & time: 02/05/22  1645     History {Add pertinent medical, surgical, social history, OB history to HPI:1} No chief complaint on file.   Devon Hanson is a 41 y.o. male.  41 year old male with a history of EtOH use (1 pint per day) and seizures on Keppra who presents emergency department with seizures.  Patient reports that last drink was last night.  Today has had 2 seizures with return to baseline in between.  Says he has a headache and nausea and vomiting at this time.  Last seizure was several weeks ago.  Was late on taking his Keppra.       Home Medications Prior to Admission medications   Medication Sig Start Date End Date Taking? Authorizing Provider  amLODipine (NORVASC) 10 MG tablet Take 1 tablet (10 mg total) by mouth daily. 01/11/22   Sharon Seller, NP  escitalopram (LEXAPRO) 10 MG tablet TAKE 1 TABLET BY MOUTH EVERY DAY 01/20/22   Sharon Seller, NP  ibuprofen (ADVIL) 800 MG tablet Take 1 tablet (800 mg total) by mouth every 8 (eight) hours as needed for moderate pain. 05/29/21   Leath-Warren, Sadie Haber, NP  levETIRAcetam (KEPPRA) 500 MG tablet Take 1 tablet (500 mg total) by mouth 2 (two) times daily. 01/19/22 01/14/23  Sater, Pearletha Furl, MD  metoprolol succinate (TOPROL-XL) 50 MG 24 hr tablet Take 50 mg by mouth daily. 06/28/21   [provider]  pantoprazole (PROTONIX) 40 MG tablet Take 40 mg by mouth daily. 08/07/21   [provider]  potassium chloride (KLOR-CON) 10 MEQ tablet Take 2 tablets (20 mEq total) by mouth daily for 5 days. 01/06/22 01/11/22  Mardene Sayer, MD  potassium chloride SA (KLOR-CON M) 20 MEQ tablet To take 2 tablets today and 2 tablets tomorrow to replace potassium 01/01/22   Sharon Seller, NP      Allergies    Patient has no known allergies.    Review of Systems   Review of Systems  Physical Exam Updated  Vital Signs There were no vitals taken for this visit. Physical Exam  ED Results / Procedures / Treatments   Labs (all labs ordered are listed, but only abnormal results are displayed) Labs Reviewed - No data to display  EKG None  Radiology No results found.  Procedures Procedures  {Document cardiac monitor, telemetry assessment procedure when appropriate:1}  Medications Ordered in ED Medications - No data to display  ED Course/ Medical Decision Making/ A&P                           Medical Decision Making  ***  {Document critical care time when appropriate:1} {Document review of labs and clinical decision tools ie heart score, Chads2Vasc2 etc:1}  {Document your independent review of radiology images, and any outside records:1} {Document your discussion with family members, caretakers, and with consultants:1} {Document social determinants of health affecting pt's care:1} {Document your decision making why or why not admission, treatments were needed:1} Final Clinical Impression(s) / ED Diagnoses Final diagnoses:  None    Rx / DC Orders ED Discharge Orders     None

## 2022-02-05 NOTE — ED Triage Notes (Signed)
Pt bib ems from home; pt has witnessed seizure by family; new hx seizures x 1 month; takes as prescribed; 2 seizures today, this am and one this after noon; drinks every day, has not had any today; pt agreeable to rehab; tachy 150 initially; 300 ns given pta; a and o, some fatigue; 4 mg zofran given en route; 183/104, HR 141, cbg 248

## 2022-02-05 NOTE — Assessment & Plan Note (Addendum)
K 3.2 this morning, given.  Nephrology following.  Started on amiloride. Asymptomatic -F/u nephrology recs -f/u renin/aldosterone -continue telemetry

## 2022-02-05 NOTE — Assessment & Plan Note (Addendum)
Presented with ?alcohol withdrawal seizure. Reports to me he doesn't drink daily, doesn't believe he has experienced withdrawal in the past. Last drink 12/22. Ethanol <10 on admission. CIWA scores 1>0 this am. -CIWA monitoring q4h with prn Ativan -Atarax 25 mg q6hrs prn for anxiety or CIWA </= 10 as per protocol  -Continue thiamine, folic acid, multivitamin

## 2022-02-05 NOTE — Assessment & Plan Note (Addendum)
Pressure of 101.3.  Patient most likely has fever in the setting of alcohol withdrawal exacerbated by possible viral GI illness as patient started having vomiting and diarrhea prior to decreasing alcohol consumption.  Unlikely bacterial infection at this time or meningitis.  Patient is alert without any meningeal signs at this time. - Continue to monitor vitals - Continue to monitor mental status - Collected blood cultures and urine cultures - GI pathogen panel and flu/COVID swab - Avoid Tylenol as antipyretic as patient has transaminitis, will use ice packs at this time and continue to monitor

## 2022-02-05 NOTE — Assessment & Plan Note (Signed)
Creatinine on admission 1.29 up from baseline of 0.85 -0.9.  Etiology of acute kidney injury could be prerenal and intrarenal.  Prerenal factors include dehydration from vomiting and diarrhea as well as low p.o. intake.  This could be exacerbated by ATN from seizures today as there was moderate hemoglobin and urinalysis as well as protein.  Patient has already received 1 L bolus in the ED. - Will continue maintenance fluids - Avoid nephrotoxic medications - Will avoid NSAIDs for pain control.

## 2022-02-05 NOTE — Progress Notes (Signed)
Inpatient and outpatient substance abuse resources attached to patients AVS.  

## 2022-02-05 NOTE — Progress Notes (Signed)
Family medicine teaching service will be admitting this patient. Our pager information can be located in the physician sticky notes, treatment team sticky notes, and the headers of all our official daily progress notes.   FAMILY MEDICINE TEACHING SERVICE Patient - Please contact intern pager [phone] or text page via website [email or web address] (login: [email or web address]) for questions regarding care. DO NOT page listed attending provider unless there is no answer from the number above.   Devon Salemi, MD PGY-3, [facility] pager [phone]   

## 2022-02-05 NOTE — H&P (Addendum)
Hospital Admission History and Physical Service Pager: [phone]  Patient name: Devon Hanson Medical record number: [id number] Date of Birth: March 26, 1980 Age: 41 y.o. Gender: male  Primary Care Provider: Sharon Seller, NP Consultants: None Code Status: Full Preferred Emergency Contact:  Phylis Bougie (wife) [phone] Dedra Skeens (mom) [phone]  Chief Complaint: Seizures   Assessment and Plan: Devon Hanson is a 41 y.o. male presenting with seizures.  Most likely etiology for seizures is alcohol withdrawal.  Patient usually consumes about a pint of liquor a day but did not have any alcohol today.  Though patient is on Keppra at home, patient was started in the ED in September and thought to most likely have seizure in the setting of alcohol withdrawal as well on likely primary seizure disorder.   Past medical history significant for alcohol use with previous alcohol withdrawal with seizure, hypertension, atrial fibrillation with cardioversion.   * Alcohol withdrawal syndrome with complication [facility]) Patient with complex alcohol withdrawal.  2 seizures with less than 24 hours without drinking alcohol per patient.  Has had seizures in the past due to withdrawal.  Received Ativan x 2 in the ED.  AST and ALT elevated more than patient's chronic elevation.  Will require aggressive treatment and monitoring given severity of alcohol withdrawal symptoms within a short period of lack of alcohol. - Admit to FPTS progressive, attending Dr. Miquel Dunn - Stepdown alcohol withdrawal protocol  - CIWA's every 4 hours with 0-4 mg Ativan, with every 1 hour as needed Ativan  - atarax 25 mg q6hrs prn for anxiety or CIWA </= 10 as per protocol   - Avoiding Librium taper at this time as patient has transaminitis - A.m. hepatic function panel  - Continue telemetry, vitals - Avoid Tylenol due to elevated AST and ALT - Consult TOC for alcohol use - Continue home Keppra - Continue thiamine, folic acid,  multivitamin - PT and OT - Seizure precautions  Fever Pressure of 101.3.  Patient most likely has fever in the setting of alcohol withdrawal exacerbated by possible viral GI illness as patient started having vomiting and diarrhea prior to decreasing alcohol consumption.  Unlikely bacterial infection at this time or meningitis.  Patient is alert without any meningeal signs at this time. - Continue to monitor vitals - Continue to monitor mental status - Collected blood cultures and urine cultures - GI pathogen panel and flu/COVID swab - Avoid Tylenol as antipyretic as patient has transaminitis, will use ice packs at this time and continue to monitor  AKI (acute kidney injury) (HCC) Creatinine on admission 1.29 up from baseline of 0.85 -0.9.  Etiology of acute kidney injury could be prerenal and intrarenal.  Prerenal factors include dehydration from vomiting and diarrhea as well as low p.o. intake.  This could be exacerbated by ATN from seizures today as there was moderate hemoglobin and urinalysis as well as protein.  Patient has already received 1 L bolus in the ED. - Will continue maintenance fluids - Avoid nephrotoxic medications - Will avoid NSAIDs for pain control.  Hypokalemia Patient with significant hypokalemia on admission less than 2.0.  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 intake as well as vomiting and diarrhea.  Patient may have viral GI illness in addition to alcohol withdrawal as vomiting and diarrhea started before decrease in alcohol consumption.  Patient does not take any medications that could be worsening hypokalemia such as diuretics.  Received 80 potassium CL p.o.  and 4 rounds of 10 mill equivalents potassium IV.   -Will monitor aggressively in the acute setting and space out thereafter - BMP every 2-4 hours to monitor response and replete as necessary. -Continue telemetry - Avoid repletion with  dextrose - Will recheck magnesium in 12 hours from repletion, in a.m.   Chronic Conditions HTN: Continue home amlodipine 10 mg daily  Afib s/p cardioversion 2019 w/o recurrence: Continue metoprolol succinate 50 mg daily  Anxiety: Continue lexapro 10 mg daily   FEN/GI: regular diet VTE Prophylaxis: lovenox  Disposition: Admit to progressive  History of Present Illness:  Devon Hanson is a 41 y.o. male presenting with seizures.  History was obtained with patient and mother. Patient had 2 seizures today.  First seizure was this morning.  Mom says that patient was vocalizing/screaming and was moving all 4 extremities.  She says that seems like patient did not recognize mom.  She says later today patient had another seizure in the afternoon where he was on the floor with same presentation, moving all 4 extremities and vocalizing.  She is not able to say if patient's eyes rolled back or how long it was.  She estimates about 10 minutes per episode.  She says that after each episode patient was very drowsy and could not remember what happened.  Patient says that he did not lose bowel or bladder incontinence and did not bite his tongue.  Patient reports that his last drink was last night.  He says that he had 3 mikes hard lemonade's plus a couple of shots.  He says that he drinks on average a pint of liquor per day.  He says that he did not have any alcohol today.  Patient reports that he is experiencing headache, nausea, irritability and sweats.  He says that for the last 4 to 5 days he has not been eating as much but unable to say why.  He also says that he has been nauseous for the last couple days and having sweats.  He reports vomiting for the last 2 days as well as having diarrhea.  Patient reports he has been taking his Keppra twice daily since September.  She says that patient took his last dose about 3 PM today.  Patient denies having any seizures while on Keppra.  Mom is concerned as he does  not remember when he has his episodes and patient lives alone intermittently.  Patient denies ever having been in a detox center. At various times during the history when mom has to remind patient of past medical history, including being hospitalized and going to detox.   In the ED, patient was tachycardic up to 133 and was tachypneic.  He has a temperature of 101.3.  Saturating well on room air.  Patient CBC was unremarkable.  His CMP showed potassium of less than 2.0, magnesium 1.3, with creatinine of 1.29 and elevated AST and ALT to 285 and 66.  Alcohol Tylenol and salicylate level all less than 10.  UDS negative.  CT head was negative for any acute intracranial abnormality.  EKG with a rate of 128 but regular, prolonged PR interval 229, long QTc at 495.  Patient received bolus of 1 L LR and 4 runs of potassium chloride 10 mill equivalents as well as 80 mill equivalents of potassium chloride packet.  He also received thiamine and folic acid and a multivitamin.  Patient also received Ativan x 2.  Review Of Systems: Per HPI with the following additions: Nausea, diarrhea,  vomiting, abdominal fullness, sweats  Pertinent Past Medical History: Alcohol use disorder  Seizure disorder on keppra  HTN - amlodipine and metop A-fib with history of cardioversion  Remainder reviewed in history tab.   Pertinent Past Surgical History: Shoulder surgery (SLAP)   Remainder reviewed in history tab.   Pertinent Social History: Tobacco use: Not currently, reports never before Alcohol use: About one pint hard liquor a day  Other Substance use: None Lives alone but occasionally with mother, or with wife, kids  Pertinent Family History: No liver disease  Remainder reviewed in history tab.   Important Outpatient Medications: Amlodipine Metop B12, folate, B1 Keppra  protonix  Remainder reviewed in medication history.   Objective: BP 130/83   Pulse (!) 113   Temp (!) 101.3 F (38.5 C) (Oral)    Resp 19   SpO2 99%  Exam: General: Well but uncomfortable appearing Eyes: No scleral icterus ENTM: Dry mucous membranes no jaundice  Neck: Normal range of motion, supple Cardiovascular: Slightly tachycardic though regular, pulses regular, no murmur gallops or rubs, cap refill less than 2 seconds Respiratory: Normal work of breathing on room air, clear to auscultation Gastrointestinal: Soft, distended, no pain to palpation, no caput medusa Derm: No jaundice, no palmar erythema, healed circular ulceration at base of right thumb. Neuro: Alert though forgetful with some gaps in memory/confusion, oriented x 4, no asterixis, PERRLA, EOMI  Labs:  CBC BMET  Recent Labs  Lab 02/05/22 1735  WBC 5.6  HGB 12.4*  HCT 33.1*  PLT 228   Recent Labs  Lab 02/05/22 1735  NA 131*  K <2.0*  CL 94*  CO2 19*  BUN <5*  CREATININE 1.29*  GLUCOSE 191*  CALCIUM 7.9*    Pertinent additional labs alcohol < 10, AST/ALT 285/66, bilirubin 1.5.   EKG:  rate of 128 but regular, prolonged PR interval 229, long QTc at 495  Imaging Studies Performed: CT Head No acute intracranial abnormality     Lockie Mola, MD 02/05/2022, 11:48 PM PGY-1, [facility] Family [facility]  FPTS Intern pager: [phone], text pages welcome Secure chat group [facility] Teaching Service   Upper Level Addendum: I have seen and evaluated this patient along with Dr. Marsh Dolly and reviewed the above note, making necessary revisions as appropriate. I agree with the medical decision making and physical exam as noted above. Fayette Pho, MD PGY-3 [facility] Family Medicine Residency

## 2022-02-06 ENCOUNTER — Other Ambulatory Visit (HOSPITAL_COMMUNITY): Payer: 59

## 2022-02-06 ENCOUNTER — Observation Stay (HOSPITAL_COMMUNITY): Payer: 59

## 2022-02-06 DIAGNOSIS — F10939 Alcohol use, unspecified with withdrawal, unspecified: Secondary | ICD-10-CM | POA: Diagnosis present

## 2022-02-06 DIAGNOSIS — R7401 Elevation of levels of liver transaminase levels: Secondary | ICD-10-CM | POA: Diagnosis present

## 2022-02-06 DIAGNOSIS — F411 Generalized anxiety disorder: Secondary | ICD-10-CM | POA: Diagnosis present

## 2022-02-06 DIAGNOSIS — I10 Essential (primary) hypertension: Secondary | ICD-10-CM | POA: Diagnosis not present

## 2022-02-06 DIAGNOSIS — Z79899 Other long term (current) drug therapy: Secondary | ICD-10-CM | POA: Diagnosis not present

## 2022-02-06 DIAGNOSIS — E119 Type 2 diabetes mellitus without complications: Secondary | ICD-10-CM | POA: Diagnosis present

## 2022-02-06 DIAGNOSIS — R569 Unspecified convulsions: Secondary | ICD-10-CM

## 2022-02-06 DIAGNOSIS — N179 Acute kidney failure, unspecified: Secondary | ICD-10-CM | POA: Diagnosis not present

## 2022-02-06 DIAGNOSIS — R69 Illness, unspecified: Secondary | ICD-10-CM | POA: Diagnosis not present

## 2022-02-06 DIAGNOSIS — Z1152 Encounter for screening for COVID-19: Secondary | ICD-10-CM | POA: Diagnosis not present

## 2022-02-06 DIAGNOSIS — Z91148 Patient's other noncompliance with medication regimen for other reason: Secondary | ICD-10-CM | POA: Diagnosis not present

## 2022-02-06 DIAGNOSIS — K219 Gastro-esophageal reflux disease without esophagitis: Secondary | ICD-10-CM | POA: Diagnosis present

## 2022-02-06 DIAGNOSIS — I480 Paroxysmal atrial fibrillation: Secondary | ICD-10-CM | POA: Diagnosis present

## 2022-02-06 DIAGNOSIS — G40909 Epilepsy, unspecified, not intractable, without status epilepticus: Secondary | ICD-10-CM | POA: Diagnosis present

## 2022-02-06 DIAGNOSIS — R509 Fever, unspecified: Secondary | ICD-10-CM | POA: Diagnosis not present

## 2022-02-06 DIAGNOSIS — F10139 Alcohol abuse with withdrawal, unspecified: Secondary | ICD-10-CM | POA: Diagnosis present

## 2022-02-06 DIAGNOSIS — E876 Hypokalemia: Secondary | ICD-10-CM | POA: Diagnosis not present

## 2022-02-06 DIAGNOSIS — E860 Dehydration: Secondary | ICD-10-CM | POA: Diagnosis present

## 2022-02-06 LAB — HEPATIC FUNCTION PANEL
ALT: 51 U/L — ABNORMAL HIGH (ref 0–44)
AST: 162 U/L — ABNORMAL HIGH (ref 15–41)
Albumin: 3.4 g/dL — ABNORMAL LOW (ref 3.5–5.0)
Alkaline Phosphatase: 105 U/L (ref 38–126)
Bilirubin, Direct: 0.4 mg/dL — ABNORMAL HIGH (ref 0.0–0.2)
Indirect Bilirubin: 0.8 mg/dL (ref 0.3–0.9)
Total Bilirubin: 1.2 mg/dL (ref 0.3–1.2)
Total Protein: 6.5 g/dL (ref 6.5–8.1)

## 2022-02-06 LAB — BASIC METABOLIC PANEL
Anion gap: 10 (ref 5–15)
Anion gap: 11 (ref 5–15)
Anion gap: 11 (ref 5–15)
Anion gap: 13 (ref 5–15)
Anion gap: 9 (ref 5–15)
BUN: 6 mg/dL (ref 6–20)
BUN: <5 mg/dL — ABNORMAL LOW (ref 6–20)
BUN: <5 mg/dL — ABNORMAL LOW (ref 6–20)
BUN: <5 mg/dL — ABNORMAL LOW (ref 6–20)
BUN: <5 mg/dL — ABNORMAL LOW (ref 6–20)
CO2: 21 mmol/L — ABNORMAL LOW (ref 22–32)
CO2: 24 mmol/L (ref 22–32)
CO2: 24 mmol/L (ref 22–32)
CO2: 25 mmol/L (ref 22–32)
CO2: 26 mmol/L (ref 22–32)
Calcium: 8 mg/dL — ABNORMAL LOW (ref 8.9–10.3)
Calcium: 8 mg/dL — ABNORMAL LOW (ref 8.9–10.3)
Calcium: 8.1 mg/dL — ABNORMAL LOW (ref 8.9–10.3)
Calcium: 8.1 mg/dL — ABNORMAL LOW (ref 8.9–10.3)
Calcium: 8.2 mg/dL — ABNORMAL LOW (ref 8.9–10.3)
Chloride: 103 mmol/L (ref 98–111)
Chloride: 105 mmol/L (ref 98–111)
Chloride: 98 mmol/L (ref 98–111)
Chloride: 98 mmol/L (ref 98–111)
Chloride: 99 mmol/L (ref 98–111)
Creatinine, Ser: 0.9 mg/dL (ref 0.61–1.24)
Creatinine, Ser: 0.92 mg/dL (ref 0.61–1.24)
Creatinine, Ser: 0.96 mg/dL (ref 0.61–1.24)
Creatinine, Ser: 1.03 mg/dL (ref 0.61–1.24)
Creatinine, Ser: 1.06 mg/dL (ref 0.61–1.24)
GFR, Estimated: >60 mL/min (ref >60)
GFR, Estimated: >60 mL/min (ref >60)
GFR, Estimated: >60 mL/min (ref >60)
GFR, Estimated: >60 mL/min (ref >60)
GFR, Estimated: >60 mL/min (ref >60)
Glucose, Bld: 105 mg/dL — ABNORMAL HIGH (ref 70–99)
Glucose, Bld: 106 mg/dL — ABNORMAL HIGH (ref 70–99)
Glucose, Bld: 107 mg/dL — ABNORMAL HIGH (ref 70–99)
Glucose, Bld: 108 mg/dL — ABNORMAL HIGH (ref 70–99)
Glucose, Bld: 119 mg/dL — ABNORMAL HIGH (ref 70–99)
Potassium: 2.1 mmol/L — CL (ref 3.5–5.1)
Potassium: 2.2 mmol/L — CL (ref 3.5–5.1)
Potassium: 2.6 mmol/L — CL (ref 3.5–5.1)
Potassium: 3.8 mmol/L (ref 3.5–5.1)
Potassium: <2 mmol/L — CL (ref 3.5–5.1)
Sodium: 134 mmol/L — ABNORMAL LOW (ref 135–145)
Sodium: 135 mmol/L (ref 135–145)
Sodium: 136 mmol/L (ref 135–145)
Sodium: 136 mmol/L (ref 135–145)
Sodium: 136 mmol/L (ref 135–145)

## 2022-02-06 LAB — OSMOLALITY, URINE: Osmolality, Ur: 91 mOsm/kg — ABNORMAL LOW (ref 300–900)

## 2022-02-06 LAB — NA AND K (SODIUM & POTASSIUM), RAND UR
Potassium Urine: 3 mmol/L
Sodium, Ur: 27 mmol/L

## 2022-02-06 LAB — MAGNESIUM
Magnesium: 1.7 mg/dL (ref 1.7–2.4)
Magnesium: 1.6 mg/dL — ABNORMAL LOW (ref 1.7–2.4)

## 2022-02-06 LAB — CK: Total CK: 1512 U/L — ABNORMAL HIGH (ref 49–397)

## 2022-02-06 LAB — HIV ANTIBODY (ROUTINE TESTING W REFLEX): HIV Screen 4th Generation wRfx: NONREACTIVE

## 2022-02-06 LAB — TSH: TSH: 3.85 u[IU]/mL (ref 0.350–4.500)

## 2022-02-06 LAB — T4, FREE: Free T4: 0.74 ng/dL (ref 0.61–1.12)

## 2022-02-06 MED ORDER — LEVETIRACETAM 750 MG PO TABS
750.0000 mg | ORAL_TABLET | Freq: Two times a day (BID) | ORAL | Status: DC
  Administered 2022-02-06 – 2022-02-08 (×4): 750 mg via ORAL
  Filled 2022-02-06 (×4): qty 1

## 2022-02-06 MED ORDER — POTASSIUM CHLORIDE 10 MEQ/100ML IV SOLN
10.0000 meq | INTRAVENOUS | Status: AC
  Administered 2022-02-06 – 2022-02-07 (×6): 10 meq via INTRAVENOUS
  Filled 2022-02-06 (×4): qty 100

## 2022-02-06 MED ORDER — LEVETIRACETAM 250 MG PO TABS
250.0000 mg | ORAL_TABLET | Freq: Once | ORAL | Status: AC
  Administered 2022-02-06: 250 mg via ORAL
  Filled 2022-02-06: qty 1

## 2022-02-06 MED ORDER — MAGNESIUM SULFATE 2 GM/50ML IV SOLN
2.0000 g | Freq: Once | INTRAVENOUS | Status: AC
  Administered 2022-02-06: 2 g via INTRAVENOUS
  Filled 2022-02-06: qty 50

## 2022-02-06 MED ORDER — POTASSIUM CHLORIDE 10 MEQ/100ML IV SOLN
10.0000 meq | INTRAVENOUS | Status: DC
  Administered 2022-02-06 (×3): 10 meq via INTRAVENOUS
  Filled 2022-02-06 (×3): qty 100

## 2022-02-06 MED ORDER — POTASSIUM CHLORIDE 20 MEQ PO PACK
80.0000 meq | PACK | ORAL | Status: AC
  Administered 2022-02-06: 80 meq via ORAL
  Filled 2022-02-06: qty 4

## 2022-02-06 MED ORDER — POTASSIUM CHLORIDE 20 MEQ PO PACK
60.0000 meq | PACK | Freq: Once | ORAL | Status: AC
  Administered 2022-02-06: 60 meq via ORAL
  Filled 2022-02-06: qty 3

## 2022-02-06 MED ORDER — POTASSIUM CHLORIDE IN NACL 40-0.9 MEQ/L-% IV SOLN
INTRAVENOUS | Status: DC
  Filled 2022-02-06: qty 1000

## 2022-02-06 NOTE — Progress Notes (Signed)
Daily Progress Note Intern Pager: [phone]  Patient name: Devon Hanson Medical record number: [id number] Date of birth: 03/08/1980 Age: 41 y.o. Gender: male  Primary Care Provider: Sharon Seller, NP Consultants: None Code Status: Full  Pt Overview and Major Events to Date:  12/22: admitted  Assessment and Plan:  Kshawn Canal is a 41 y.o. male who presented with seizure, also found to have severe hypokalemia and fever. Pertinent PMH/PSH includes alcohol use disorder with history of withdrawal, prior seizure, a-fib s/p cardioversion, HTN, and anxiety.   * Seizure [facility]) Presented with seizure activity x2. History of the same 1 month ago and in September. On Keppra although seizures previously thought to be related to alcohol withdrawal. Reports to me he only drinks 4 days/week, ~3 drinks per day. Warrants additional workup at this point. -Will obtain brain MRI and EEG -Consult neuro, appreciate recommendations -Continue Keppra -Seizure precautions  Hypokalemia K <2.0 this morning. Has now received potassium (160 mEq PO and IV) and has an additional IV ordered. Etiology multifactorial due to GI losses, chronic alcohol use, hypomag. -BMP q3 hours, replete as needed -Continue telemetry -f/u am magnesium, replete as needed -start mIVF: NS w/40 KCl at 146mL/hr  Alcohol withdrawal syndrome with complication [facility]) Presented with ?alcohol withdrawal seizure. Reports to me he doesn't drink daily, doesn't believe he has experienced withdrawal in the past. Last drink 12/22. Ethanol <10 on admission. CIWA scores 1>0 this am. -CIWA monitoring q4h with prn Ativan -Atarax 25 mg q6hrs prn for anxiety or CIWA </= 10 as per protocol  -Continue thiamine, folic acid, multivitamin  Transaminitis AST/ALT elevated to 162/51, downtrending from admission. Likely secondary to chronic alcohol use and possibly component from seizure as well. -Trend CMP -Tylenol ok if  needed for fever, keep daily dose less than 2g  Fever Febrile to 101.3 on admission, afebrile this morning. Etiology thought to be viral GI illness vs autonomic effects of alcohol withdrawal. -f/u blood cultures and urine cultures -f/u GI pathogen panel -trend fever curve   Chronic and stable conditions: HTN: continue home amlodipine 10mg  daily Afib s/p cardioversion 2019 without recurrence: continue metoprolol 50mg  daily Generalized anxiety disorder: continue home lexapro 10mg  daily  FEN/GI: regular diet PPx: Lovenox Dispo: pending clinical improvement    Subjective:  Patient denies complaints this morning. No further episodes of diarrhea since last night. States he had 2 seizures yesterday which is why he came in. First episode occurred while he was laying in bed. Witnessed by 2020. Mom apparently didn't think much of it according to the patient. Second episode occurred while he was inside the house, Mom and neighbor were in the yard. They apparently "heard him hollering". Patient unable to provide more detail because he doesn't remember the event, just remembers waking up to EMS. No injury, tongue bite or incontinence.   With regard to his drinking patient states he usually drinks 4 days per week (Thurs-Sun). Drink of choice = brown liquor. Amount depends on the occasion, but on average has a few (~3) drinks.  Objective: Temp:  [98 F (36.7 C)-101.3 F (38.5 C)] 98.2 F (36.8 C) (12/23 0903) Pulse Rate:  [95-133] 120 (12/23 0803) Resp:  [10-28] 22 (12/23 0800) BP: (119-148)/(71-101) 132/101 (12/23 0803) SpO2:  [96 %-100 %] 98 % (12/23 0800) Physical Exam: General: alert, resting comfortably, NAD Cardiovascular: mildly tachycardic, normal S1/S2 without m/r/g Respiratory: normal effort, lungs CTAB Abdomen: soft, nontender, nondistended Extremities: no peripheral edema Neuro: A&O x4, CN  II-XII intact, 5/5 strength in all extremities, sensation intact to light touch throughout,  normal finger-to-nose, normal speech  Laboratory: Most recent CBC Lab Results  Component Value Date   WBC 5.6 02/05/2022   HGB 12.4 (L) 02/05/2022   HCT 33.1 (L) 02/05/2022   MCV 95.7 02/05/2022   PLT 228 02/05/2022   Most recent BMP    Latest Ref Rng & Units 02/06/2022    7:37 AM  BMP  Glucose 70 - 99 mg/dL 563   BUN 6 - 20 mg/dL <5   Creatinine 8.75 - 1.24 mg/dL 6.43   Sodium 329 - 518 mmol/L 136   Potassium 3.5 - 5.1 mmol/L 3.8   Chloride 98 - 111 mmol/L 105   CO2 22 - 32 mmol/L 21   Calcium 8.9 - 10.3 mg/dL 8.1    Other pertinent labs: AST 162, ALT 51 CK 1512  Imaging/Diagnostic Tests: DG Chest 2 View  Result Date: 02/06/2022 CLINICAL DATA:  Fever EXAM: CHEST - 2 VIEW COMPARISON:  Radiographs 10/21/2021 FINDINGS: Low lung volumes. The heart size and mediastinal contours are within normal limits. Both lungs are clear. The visualized skeletal structures are unremarkable. IMPRESSION: No active cardiopulmonary disease. Electronically Signed   By: Minerva Fester M.D.   On: 02/06/2022 01:49   CT Head Wo Contrast  Result Date: 02/05/2022 CLINICAL DATA:  SEIZURE, ETOH ABUSE, FALL EXAM: CT HEAD WITHOUT CONTRAST TECHNIQUE: Contiguous axial images were obtained from the base of the skull through the vertex without intravenous contrast. RADIATION DOSE REDUCTION: This exam was performed according to the departmental dose-optimization program which includes automated exposure control, adjustment of the mA and/or kV according to patient size and/or use of iterative reconstruction technique. COMPARISON:  CT head 10/21/2021. FINDINGS: Brain: No evidence of acute infarction, hemorrhage, hydrocephalus, extra-axial collection or mass lesion/mass effect. Vascular: No hyperdense vessel identified. Skull: No acute fracture. Sinuses/Orbits: Largely clear sinuses.  No acute orbital findings. Other: No mastoid effusions. IMPRESSION: No evidence of acute intracranial abnormality. Electronically  Signed   By: Feliberto Harts M.D.   On: 02/05/2022 18:51      Maury Dus, MD 02/06/2022, 9:52 AM  PGY-3, [facility] Family Medicine FPTS Intern pager: [phone], text pages welcome Secure chat group [facility] Teaching Service
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
## 2022-02-06 NOTE — ED Notes (Signed)
Devon Hanson transporting pt to progressive unit

## 2022-02-06 NOTE — Progress Notes (Signed)
FMTS Brief Progress Note  S: Night rounding. Wanted to check patient after receiving total of 4 mg ativan. Patient found laying in bed comfortably, cardiac monitor leads all off. He reports feeling fine. No complaints, asks for ice water. Noted half full urinal upright on the floor to patient's left, and a sheet bundled up on floor with yellow stain.   Discussed patient with nurse. She reports event where patient apparently pulled out his 18g line and attempted to get to bathroom quickly, unfortunately trailing blood and fecal matter. RN relays patient had a hard time following instructions at that time as well. She was able to get him back in bed and insert a new PIV to administer the ativan, which made quite a difference. Patient felt better almost immediately.   Physician replaced cardiac monitor leads. HR low 110s. Re-took BP, showed elevated at 135/90s on recheck.   O: BP 130/83   Pulse (!) 113   Temp (!) 101.3 F (38.5 C) (Oral)   Resp 19   SpO2 99%   Gen: awake, alert, NAD Cardiac: skin warm to touch Resp: breathing comfortably, no distress  A/P: ETOH withdrawal - CIWA q4 w/ reflexive ativan sliding scale - ativan q1 PRN sliding scale for breakthrough symptoms - not using librium taper given worsened transaminitis   Fever, vomiting, diarrhea COVID, flu, and RSV swab has come back negative. Unable to use tylenol due to worsened transaminitis, unable to use NSAIDs given increased creatinine.  - await blood and urine cultures - will order CXR given negative respiratory swab     Fayette Pho, MD 02/06/2022, 12:22 AM PGY-3, [facility] Night Resident  Please page [phone] with questions.

## 2022-02-06 NOTE — ED Notes (Signed)
K+ less that 2 MD Pray notified

## 2022-02-06 NOTE — Progress Notes (Signed)
Date and time results received: 02/06/22 now  (use smartphrase ".now" to insert current time)  Test: Potassium Critical Value: 2.6  Name of Provider Notified: Elberta Fortis, MD  Orders Received? Or Actions Taken?: Will put in order for depletion.  Actions Taken: Will put in order for depletion.

## 2022-02-06 NOTE — Procedures (Signed)
Routine EEG Report  Devon Hanson is a 41 y.o. male with a history of seizure who is undergoing an EEG to evaluate for seizures.  Report: This EEG was acquired with electrodes placed according to the International 10-20 electrode system (including Fp1, Fp2, F3, F4, C3, C4, P3, P4, O1, O2, T3, T4, T5, T6, A1, A2, Fz, Cz, Pz). The following electrodes were missing or displaced: none.  The occipital dominant rhythm was 9 Hz. This activity is reactive to stimulation. Drowsiness was manifested by background fragmentation; deeper stages of sleep were not identified. There was no focal slowing. There were no interictal epileptiform discharges. There were no electrographic seizures identified. Photic stimulation and hyperventilation were not performed.  Impression: This EEG was obtained while awake and drowsy and is normal.    Clinical Correlation: Normal EEGs, however, do not rule out epilepsy.  Bing Neighbors, MD [facility] [phone]  If 7pm- 7am, please page neurology on call as listed in AMION.

## 2022-02-06 NOTE — ED Notes (Signed)
MD made aware of critical 2.2 K+

## 2022-02-06 NOTE — Progress Notes (Signed)
Concerns reported to [facility] Medicine team regarding patient's behavior.   Since shift change there have been multiple conversation which seemed disorganized and confabulated.   He reports knowing the visitors leaving the ICU (his room is near the exit and he can see the people coming in and out), he reports working with them, and knowing who died tonight.   Attempted asking where he knew the visitors from and how did he know someone had died in the ICU. Patient changed the subject.

## 2022-02-06 NOTE — ED Notes (Signed)
PT in xray 

## 2022-02-06 NOTE — Progress Notes (Signed)
FMTS Brief Progress Note  S:Went bedside to see patient w/ Dr. Robyne Peers. Patient laying comfortably in bed, no complaints. Patient thought he recognized providers from outside the hospital, and also thought that he was in the ICU yesterday. Reports last drink was Wednesday.    O: BP (!) 156/96 (BP Location: Left Arm)   Pulse (!) 104   Temp 98.5 F (36.9 C) (Oral)   Resp 17   SpO2 99%   Gen: NAD, conversational Card: RRR, NRMG Pulm: CTABL Neuro: A&Ox4, normal motor and sensation  A/P: Seizure Patient w/ normal neuro exam, A&Ox4 but seemed a little confused on his hospital stay/recognition of providers.  -Continue to monitor -Continue Keppra -Seizure precautions  Hypokalemia K 2.6 this morning, s/p 190 mEq of potassium (160 mEq PO and 30 mEq IV) -AM BMP -Consider Mg, K supplementation in AM  -Continue telmetry  Alcohol Withdrawal  Reports last drink was Wed 12/20. CIWA 2 at 2013 -CIWA monitoring q4h w/ prn ativan -Atarax 25 mg q6hrs prn for anxiety for CIWA <= 10 as per protocol -Cont Thiamin, folic acid, MVI  - Orders reviewed. Labs for AM ordered, which was adjusted as needed.   Bess Kinds, MD 02/06/2022, 8:29 PM PGY-2, [place] [facility] Night Resident  Please page [phone] with questions.

## 2022-02-06 NOTE — Assessment & Plan Note (Addendum)
Improving, ALT now wnl, AST still elevated to 101, likely secondary to alcohol use and possibly component from seizure as well. -Trend CMP -Tylenol ok if needed, keep daily dose less than 2g

## 2022-02-06 NOTE — Hospital Course (Signed)
Devon Hanson is a 41 y.o. male who presented with seizure activity. PMH significant for alcohol use disorder, prior seizure, a-fib s/p cardioversion, HTN, and anxiety. Hospital course is outlined below:  Seizure Presented with 2 episodes of witnessed seizure activity.  ?due to alcohol withdrawal vs underlying seizure disorder. Neuro was consulted, recommended increasing his home Keppra. EEG and MRI ***   Hypokalemia Patient found to have severe hypokalemia with K<2.0. Corrected appropriately with aggressive repletion. Etiology ***   Alcohol Use Disorder ***   Fever GI illness?

## 2022-02-06 NOTE — Progress Notes (Signed)
Patient's bed alarm sounding, primary RN immediately at bedside.  Found patient in the restroom, bleeding from PIV sites, he disconnected himself and blood was running down BUE. He had also moved his bowels all over the room.     Patient was not understanding the need for calling for assistance.   Boundaries were set again, education provided.   Will monitor closely.

## 2022-02-06 NOTE — Assessment & Plan Note (Addendum)
Presented with seizure activity x2. History of the same 1 month ago and in September. Etiology likely related to missed Keppra dose and alcohol withdrawal. MRI brain and EEG unremarkable. -Neuro signed off, appreciate their recommendations -Continue Keppra at increased dose -Seizure precautions

## 2022-02-06 NOTE — Progress Notes (Addendum)
Received page from RN regarding critical potassium level of <2.   Reviewed chart. I am curious as to why he cannot retain his potassium despite initial response and no vomiting or diarrhea since just before midnight. Wonder if due to intercellular shifts. Below is a summary of labs and repletion thus far:  - repleted PO 80 MEQ at 8 pm - started repletion with IV K at 8:45 pm - 12 am lab = K 2.1 - IV repletion completed, received total of 30 MEQ IV by 1 am - 2 am lab = K 2.2 - 4 am lab = K <2   Plan: This patient presents a complicated scenario for diagnosis and management. While vomiting and diarrhea can certainly lower potassium through losses, so can alcohol withdrawal through intracellular shifts. Was reading that a patient with K of 2 could have a true deficit of anywhere between 400 and 800 MEQ.  - additional 80 MEQ PO STAT - add CK, TSH, T4, and random urine Na and K - BMP q3 hours (with time allowances for collection and testing, we'll be able to make repletion decisions every 4 hours)   Fayette Pho, MD

## 2022-02-06 NOTE — ED Notes (Signed)
Completed CIWA pt denied any symptoms, resting at this time.

## 2022-02-06 NOTE — Progress Notes (Signed)
EEG complete - results pending 

## 2022-02-06 NOTE — Evaluation (Signed)
Physical Therapy Evaluation & Discharge Patient Details Name: Devon Hanson MRN: [id number] DOB: 01-02-1981 Today's Date: 02/06/2022  History of Present Illness  Pt is a 41 y.o. male who presented 02/05/22 with seizures, hypokalemia, and fever. CT head negative. PMH includes ETOH abuse, anxiety, GERD, HTN, PAF, L shoulder arthroscopy with labral repair 09/08/2021., afib s/p cardioversion   Clinical Impression  Pt presents with condition above. 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 Pt currently is demonstrating some very mild balance deficits with dynamic gait challenges, but did not have LOB or need for UE support or assistance. Pt is functioning close to his baseline and will likely progress well mobilizing with nursing staff and mobility techs. No acute PT needs identified. All education completed and questions answered. PT will sign off.        Recommendations for follow up therapy are one component of a multi-disciplinary discharge planning process, led by the attending physician.  Recommendations may be updated based on patient status, additional functional criteria and insurance authorization.  Follow Up Recommendations No PT follow up      Assistance Recommended at Discharge PRN  Patient can return home with the following  Assist for transportation;Assistance with cooking/housework    Equipment Recommendations None recommended by PT  Recommendations for Other Services       Functional Status Assessment Patient has not had a recent decline in their functional status     Precautions / Restrictions Precautions Precautions: Other (comment) Precaution Comments: seizure Restrictions Weight Bearing Restrictions: No      Mobility  Bed Mobility Overal bed mobility: Modified Independent             General bed mobility comments: HOB elevated    Transfers Overall transfer level:  Independent Equipment used: None               General transfer comment: Able to come to stand without assistance or LOB    Ambulation/Gait Ambulation/Gait assistance: Supervision Gait Distance (Feet): 340 Feet Assistive device: None Gait Pattern/deviations: Step-through pattern, Drifts right/left Gait velocity: WFL Gait velocity interpretation: >4.37 ft/sec, indicative of normal walking speed (when cued to increase speed)   General Gait Details: Pt with intermittent minor sway laterally when changing head positions but no LOB, supervision for safety  Stairs Stairs: Yes Stairs assistance: Supervision Stair Management: One rail Right, Alternating pattern, Forwards Number of Stairs: 3 General stair comments: Ascends and descends with R rail with reciprocal steps, no LOB, supervision for safety  Wheelchair Mobility    Modified Rankin (Stroke Patients Only) Modified Rankin (Stroke Patients Only) Pre-Morbid Rankin Score: No symptoms Modified Rankin: No symptoms     Balance Overall balance assessment: Mild deficits observed, not formally tested (very mild)                                           Pertinent Vitals/Pain Pain Assessment Pain Assessment: Faces Faces Pain Scale: No hurt Pain Intervention(s): Monitored during session    Home Living Family/patient expects to be discharged to:: Private residence Living Arrangements: Spouse/significant other;Children (several children, youngest is 58 y.o.) Available Help at Discharge: Family;Available 24 hours/day (wife works from home) Type of Home: House Home Access: Stairs to enter Entrance Stairs-Rails: Can reach both (on 4 STE, no rails on 2 STE) Entrance Stairs-Number of  Steps: 2-4 (depending on entrance)   Home Layout: One level Home Equipment: Shower seat      Prior Function Prior Level of Function : Independent/Modified Independent               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 Slow processing intermittently. Appears to be close to if not at baseline        General Comments General comments (skin integrity, edema, etc.): educated pt to be aware of some potential mild cog or balance issues following seizures and have family observe him to ensure safety. He verbalized understanding    Exercises     Assessment/Plan    PT Assessment Patient does not need any further PT services  PT Problem List         PT Treatment Interventions      PT Goals (Current goals can be found in the Care Plan section)  Acute Rehab PT Goals Patient Stated Goal: to go home for Christmas PT Goal Formulation: All assessment and education complete, DC therapy Time For Goal Achievement: 02/07/22 Potential to Achieve Goals: Good    Frequency       Co-evaluation               AM-PAC PT "6 Clicks" Mobility  Outcome Measure Help needed turning from your back to your side while in a flat bed without using bedrails?: None Help needed moving from lying on your back to sitting on the side of a flat bed without using bedrails?: None Help needed moving to and from a bed to a chair (including a wheelchair)?: None Help needed standing up from a chair using your arms (e.g., wheelchair or bedside chair)?: None Help needed to walk in  hospital room?: A Little Help needed climbing 3-5 steps with a railing? : A Little 6 Click Score: 22    End of Session Equipment Utilized During Treatment: Gait belt Activity Tolerance: Patient tolerated treatment well Patient left: in bed;with call bell/phone within reach;with bed alarm set Nurse Communication: Mobility status PT Visit Diagnosis: Other abnormalities of gait and mobility (R26.89)    Time: [id number] PT Time Calculation (min) (ACUTE ONLY): 13 min   Charges:   PT Evaluation $PT Eval Low Complexity: 1 Low          Moishe Spice, PT, DPT Acute Rehabilitation Services  Office: [phone]   Orvan Falconer 02/06/2022, 5:00 PM

## 2022-02-06 NOTE — Progress Notes (Signed)
On assessment, patient seems confused regarding situation. Reports family member passing away recently.    MD at bedside.

## 2022-02-07 HISTORY — DX: Hypocalcemia: E83.51

## 2022-02-07 LAB — COMPREHENSIVE METABOLIC PANEL
ALT: 42 U/L (ref 0–44)
AST: 101 U/L — ABNORMAL HIGH (ref 15–41)
Albumin: 3.1 g/dL — ABNORMAL LOW (ref 3.5–5.0)
Alkaline Phosphatase: 88 U/L (ref 38–126)
Anion gap: 9 (ref 5–15)
BUN: <5 mg/dL — ABNORMAL LOW (ref 6–20)
CO2: 24 mmol/L (ref 22–32)
Calcium: 7.8 mg/dL — ABNORMAL LOW (ref 8.9–10.3)
Chloride: 105 mmol/L (ref 98–111)
Creatinine, Ser: 0.78 mg/dL (ref 0.61–1.24)
GFR, Estimated: >60 mL/min (ref >60)
Glucose, Bld: 95 mg/dL (ref 70–99)
Potassium: 2.4 mmol/L — CL (ref 3.5–5.1)
Sodium: 138 mmol/L (ref 135–145)
Total Bilirubin: 1.2 mg/dL (ref 0.3–1.2)
Total Protein: 6 g/dL — ABNORMAL LOW (ref 6.5–8.1)

## 2022-02-07 LAB — PHOSPHORUS: Phosphorus: 3.1 mg/dL (ref 2.5–4.6)

## 2022-02-07 LAB — BASIC METABOLIC PANEL
Anion gap: 10 (ref 5–15)
BUN: <5 mg/dL — ABNORMAL LOW (ref 6–20)
CO2: 24 mmol/L (ref 22–32)
Calcium: 8.1 mg/dL — ABNORMAL LOW (ref 8.9–10.3)
Chloride: 104 mmol/L (ref 98–111)
Creatinine, Ser: 0.73 mg/dL (ref 0.61–1.24)
GFR, Estimated: >60 mL/min (ref >60)
Glucose, Bld: 101 mg/dL — ABNORMAL HIGH (ref 70–99)
Potassium: 3.1 mmol/L — ABNORMAL LOW (ref 3.5–5.1)
Sodium: 138 mmol/L (ref 135–145)

## 2022-02-07 LAB — URINE CULTURE
Culture: NO GROWTH
Special Requests: NORMAL

## 2022-02-07 LAB — AMMONIA: Ammonia: 47 umol/L — ABNORMAL HIGH (ref 9–35)

## 2022-02-07 LAB — MAGNESIUM
Magnesium: 1.7 mg/dL (ref 1.7–2.4)
Magnesium: 1.5 mg/dL — ABNORMAL LOW (ref 1.7–2.4)

## 2022-02-07 LAB — OSMOLALITY: Osmolality: 288 mOsm/kg (ref 275–295)

## 2022-02-07 MED ORDER — POTASSIUM CHLORIDE 20 MEQ PO PACK
60.0000 meq | PACK | Freq: Once | ORAL | Status: AC
  Administered 2022-02-07: 60 meq via ORAL
  Filled 2022-02-07: qty 3

## 2022-02-07 MED ORDER — MAGNESIUM SULFATE 2 GM/50ML IV SOLN
2.0000 g | Freq: Once | INTRAVENOUS | Status: AC
  Administered 2022-02-07: 2 g via INTRAVENOUS
  Filled 2022-02-07: qty 50

## 2022-02-07 MED ORDER — POTASSIUM CHLORIDE 10 MEQ/100ML IV SOLN
10.0000 meq | INTRAVENOUS | Status: AC
  Administered 2022-02-07 (×4): 10 meq via INTRAVENOUS
  Filled 2022-02-07 (×4): qty 100

## 2022-02-07 MED ORDER — POTASSIUM CHLORIDE 20 MEQ PO PACK
40.0000 meq | PACK | Freq: Once | ORAL | Status: AC
  Administered 2022-02-07: 40 meq via ORAL
  Filled 2022-02-07: qty 2

## 2022-02-07 MED ORDER — AMILORIDE HCL 5 MG PO TABS
5.0000 mg | ORAL_TABLET | Freq: Every day | ORAL | Status: DC
  Administered 2022-02-07 – 2022-02-08 (×2): 5 mg via ORAL
  Filled 2022-02-07 (×2): qty 1

## 2022-02-07 NOTE — Assessment & Plan Note (Addendum)
Ca corrects to 8.8. Consider checking PTH outpatient

## 2022-02-07 NOTE — Consult Note (Signed)
[facility]  HISTORY AND PHYSICAL  Devon Hanson is an 41 y.o. male.    Chief Complaint: seizures  HPI: Pt is a 9M with EtOH use disorder, HTN, h/o Afib s/p cardioversion, and anxiety who is now seen in consultation at the request of Dr. Thompson Hanson for eval and recs re: hypokalemia.    He presented to [facility] for witnessed seizure activity.  K was found to be < 2.0.  aggressively repleted but is still in the mid-2s.    Says he's had HTN "as long as he can remember".  He declines to comment on family history.  K supps on his home med list but says not taking.    Urine studies showed osms 91, K 3, and sodium 27.  He says he's eating and drinking well here.  No diarrhea.  Seizures ? EtOH withdrawal.    PMH: Past Medical History:  Diagnosis Date   Anxiety    Dysrhythmia    GERD (gastroesophageal reflux disease)    History of cardioversion 05/04/2017   Hypertension    Paroxysmal atrial fibrillation (HCC)    PSH: Past Surgical History:  Procedure Laterality Date   NO PAST SURGERIES     SHOULDER ARTHROSCOPY WITH LABRAL REPAIR Left 09/08/2021   Procedure: LEFT SHOULDER ARTHROSCOPIC LABRAL REPAIR;  Surgeon: Meredith Pel, MD;  Location: [facility];  Service: Orthopedics;  Laterality: Left;     Past Medical History:  Diagnosis Date   Anxiety    Dysrhythmia    GERD (gastroesophageal reflux disease)    History of cardioversion 05/04/2017   Hypertension    Paroxysmal atrial fibrillation (HCC)     Medications:  Scheduled:  aMILoride  5 mg Oral Daily   amLODipine  10 mg Oral Daily   enoxaparin (LOVENOX) injection  40 mg Subcutaneous Q24H   escitalopram  10 mg Oral Daily   folic acid  1 mg Oral Daily   levETIRAcetam  750 mg Oral BID   LORazepam  0-4 mg Oral Q4H   Followed by   LORazepam  0-4 mg Oral Q8H   metoprolol succinate  50 mg Oral Daily   multivitamin with minerals  1 tablet Oral Daily   pantoprazole  40 mg Oral Daily   thiamine  100 mg Oral Daily   Or    thiamine  100 mg Intravenous Daily    Medications Prior to Admission  Medication Sig Dispense Refill   amLODipine (NORVASC) 10 MG tablet Take 1 tablet (10 mg total) by mouth daily. 30 tablet 0   escitalopram (LEXAPRO) 10 MG tablet TAKE 1 TABLET BY MOUTH EVERY DAY 30 tablet 0   levETIRAcetam (KEPPRA) 500 MG tablet Take 1 tablet (500 mg total) by mouth 2 (two) times daily. 60 tablet 11   metoprolol succinate (TOPROL-XL) 50 MG 24 hr tablet Take 50 mg by mouth daily.     pantoprazole (PROTONIX) 40 MG tablet Take 40 mg by mouth daily.     ibuprofen (ADVIL) 800 MG tablet Take 1 tablet (800 mg total) by mouth every 8 (eight) hours as needed for moderate pain. (Patient not taking: Reported on 02/05/2022) 30 tablet 0   potassium chloride (KLOR-CON) 10 MEQ tablet Take 2 tablets (20 mEq total) by mouth daily for 5 days. (Patient not taking: Reported on 02/05/2022) 10 tablet 0   potassium chloride SA (KLOR-CON M) 20 MEQ tablet To take 2 tablets today and 2 tablets tomorrow to replace potassium (Patient taking differently: Take 20 mEq by mouth daily.)  4 tablet 0    ALLERGIES:  No Known Allergies  FAM HX: No family history on file.  Social History:   reports that he has never smoked. He has never used smokeless tobacco. He reports current alcohol use. He reports that he does not use drugs.  ROS: ROS: all other systems reviewed and are negative except as per HPI  Blood pressure 115/81, pulse 80, temperature 97.9 F (36.6 C), temperature source Oral, resp. rate 20, SpO2 98 %. PHYSICAL EXAM: Physical Exam GEN NAD, lying in bed HEENT doesn't open eyes for me NECK no JVD PULM clear CV RRR ABD soft EXT no LE edema NEURO AAO x 3 nonfocal  Results for orders placed or performed during the hospital encounter of 02/05/22 (from the past 48 hour(s))  Comprehensive metabolic panel     Status: Abnormal   Collection Time: 02/05/22  5:35 PM  Result Value Ref Range   Sodium 131 (L) 135 - 145 mmol/L    Potassium <2.0 (LL) 3.5 - 5.1 mmol/L    Comment: CRITICAL RESULT CALLED TO, READ BACK BY AND VERIFIED WITH B,HUNTER RN @1922  02/05/22 E,BENTON   Chloride 94 (L) 98 - 111 mmol/L   CO2 19 (L) 22 - 32 mmol/L   Glucose, Bld 191 (H) 70 - 99 mg/dL    Comment: Glucose reference range applies only to samples taken after fasting for at least 8 hours.   BUN <5 (L) 6 - 20 mg/dL   Creatinine, Ser 9.89 (H) 0.61 - 1.24 mg/dL   Calcium 7.9 (L) 8.9 - 10.3 mg/dL   Total Protein 7.1 6.5 - 8.1 g/dL   Albumin 3.8 3.5 - 5.0 g/dL   AST 211 (H) 15 - 41 U/L    Comment: RESULT CONFIRMED BY MANUAL DILUTION   ALT 66 (H) 0 - 44 U/L    Comment: RESULT CONFIRMED BY MANUAL DILUTION   Alkaline Phosphatase 116 38 - 126 U/L   Total Bilirubin 1.5 (H) 0.3 - 1.2 mg/dL   GFR, Estimated >94 >17 mL/min    Comment: (NOTE) Calculated using the CKD-EPI Creatinine Equation (2021)    Anion gap 18 (H) 5 - 15    Comment: Performed at [facility], [address], [place], [place] [address]  CBC with Differential/Platelet     Status: Abnormal   Collection Time: 02/05/22  5:35 PM  Result Value Ref Range   WBC 5.6 4.0 - 10.5 K/uL   RBC 3.46 (L) 4.22 - 5.81 MIL/uL   Hemoglobin 12.4 (L) 13.0 - 17.0 g/dL   HCT 48.1 (L) 85.6 - 31.4 %   MCV 95.7 80.0 - 100.0 fL   MCH 35.8 (H) 26.0 - 34.0 pg   MCHC 37.5 (H) 30.0 - 36.0 g/dL    Comment: CORRECTED FOR COLD AGGLUTININS   RDW 14.2 11.5 - 15.5 %   Platelets 228 150 - 400 K/uL   nRBC 0.0 0.0 - 0.2 %   Neutrophils Relative % 85 %   Neutro Abs 4.8 1.7 - 7.7 K/uL   Lymphocytes Relative 8 %   Lymphs Abs 0.5 (L) 0.7 - 4.0 K/uL   Monocytes Relative 6 %   Monocytes Absolute 0.3 0.1 - 1.0 K/uL   Eosinophils Relative 0 %   Eosinophils Absolute 0.0 0.0 - 0.5 K/uL   Basophils Relative 0 %   Basophils Absolute 0.0 0.0 - 0.1 K/uL   Immature Granulocytes 1 %   Abs Immature Granulocytes 0.03 0.00 - 0.07 K/uL  Comment: Performed at [facility], [place] [address] [place],  [place] [address]  Magnesium     Status: Abnormal   Collection Time: 02/05/22  5:35 PM  Result Value Ref Range   Magnesium 1.3 (L) 1.7 - 2.4 mg/dL    Comment: Performed at [facility] [address], [place], [place] [address]  Ethanol     Status: None   Collection Time: 02/05/22  5:35 PM  Result Value Ref Range   Alcohol, Ethyl (B) <10 <10 mg/dL    Comment: (NOTE) Lowest detectable limit for serum alcohol is 10 mg/dL.  For medical purposes only. Performed at [facility], [place] [address] [place], [place] [address]   CBG monitoring, ED     Status: Abnormal   Collection Time: 02/05/22  7:06 PM  Result Value Ref Range   Glucose-Capillary 110 (H) 70 - 99 mg/dL    Comment: Glucose reference range applies only to samples taken after fasting for at least 8 hours.  Urinalysis, Routine w reflex microscopic Urine, Clean Catch     Status: Abnormal   Collection Time: 02/05/22  7:38 PM  Result Value Ref Range   Color, Urine STRAW (A) YELLOW   APPearance CLEAR CLEAR   Specific Gravity, Urine 1.003 (L) 1.005 - 1.030   pH 6.0 5.0 - 8.0   Glucose, UA NEGATIVE NEGATIVE mg/dL   Hgb urine dipstick MODERATE (A) NEGATIVE   Bilirubin Urine NEGATIVE NEGATIVE   Ketones, ur NEGATIVE NEGATIVE mg/dL   Protein, ur 30 (A) NEGATIVE mg/dL   Nitrite NEGATIVE NEGATIVE   Leukocytes,Ua NEGATIVE NEGATIVE   RBC / HPF 0-5 0 - 5 RBC/hpf   WBC, UA 0-5 0 - 5 WBC/hpf   Bacteria, UA NONE SEEN NONE SEEN   Squamous Epithelial / LPF 0-5 0 - 5    Comment: Performed at [facility], [address] [place], [place] [address]  Rapid urine drug screen (hospital performed)     Status: None   Collection Time: 02/05/22  7:38 PM  Result Value Ref Range   Opiates NONE DETECTED NONE DETECTED   Cocaine NONE DETECTED NONE DETECTED   Benzodiazepines NONE DETECTED NONE DETECTED   Amphetamines NONE DETECTED NONE DETECTED   Tetrahydrocannabinol NONE DETECTED NONE DETECTED   Barbiturates NONE DETECTED NONE DETECTED     Comment: (NOTE) DRUG SCREEN FOR MEDICAL PURPOSES ONLY.  IF CONFIRMATION IS NEEDED FOR ANY PURPOSE, NOTIFY LAB WITHIN 5 DAYS.  LOWEST DETECTABLE LIMITS FOR URINE DRUG SCREEN Drug Class                     Cutoff (ng/mL) Amphetamine and metabolites    1000 Barbiturate and metabolites    200 Benzodiazepine                 200 Opiates and metabolites        300 Cocaine and metabolites        300 THC                            50 Performed at [facility], [place] [address], [place], [place] [address]   Urine Culture     Status: None   Collection Time: 02/05/22  7:38 PM   Specimen: Urine, Clean Catch  Result Value Ref Range   Specimen Description URINE, CLEAN CATCH    Special Requests Normal    Culture  NO GROWTH Performed at [facility], [address] [place], [place] [address]    Report Status 02/07/2022 FINAL   Resp panel by RT-PCR (RSV, Flu A&B, Covid) Anterior Nasal Swab     Status: None   Collection Time: 02/05/22 10:11 PM   Specimen: Anterior Nasal Swab  Result Value Ref Range   SARS Coronavirus 2 by RT PCR NEGATIVE NEGATIVE    Comment: (NOTE) SARS-CoV-2 target nucleic acids are NOT DETECTED.  The SARS-CoV-2 RNA is generally detectable in upper respiratory specimens during the acute phase of infection. The lowest concentration of SARS-CoV-2 viral copies this assay can detect is 138 copies/mL. A negative result does not preclude SARS-Cov-2 infection and should not be used as the sole basis for treatment or other patient management decisions. A negative result may occur with  improper specimen collection/handling, submission of specimen other than nasopharyngeal swab, presence of viral mutation(s) within the areas targeted by this assay, and inadequate number of viral copies(<138 copies/mL). A negative result must be combined with clinical observations, patient history, and epidemiological information. The expected result is Negative.  Fact Sheet for  Patients:  BloggerCourse.com  Fact Sheet for Healthcare Providers:  SeriousBroker.it  This test is no t yet approved or cleared by the [place] FDA and  has been authorized for detection and/or diagnosis of SARS-CoV-2 by FDA under an Emergency Use Authorization (EUA). This EUA will remain  in effect (meaning this test can be used) for the duration of the COVID-19 declaration under Section 564(b)(1) of the Act, 21 U.S.C.section 360bbb-3(b)(1), unless the authorization is terminated  or revoked sooner.       Influenza A by PCR NEGATIVE NEGATIVE   Influenza B by PCR NEGATIVE NEGATIVE    Comment: (NOTE) The Xpert Xpress SARS-CoV-2/FLU/RSV plus assay is intended as an aid in the diagnosis of influenza from Nasopharyngeal swab specimens and should not be used as a sole basis for treatment. Nasal washings and aspirates are unacceptable for Xpert Xpress SARS-CoV-2/FLU/RSV testing.  Fact Sheet for Patients: BloggerCourse.com  Fact Sheet for Healthcare Providers: SeriousBroker.it  This test is not yet approved or cleared by the [place] FDA and has been authorized for detection and/or diagnosis of SARS-CoV-2 by FDA under an Emergency Use Authorization (EUA). This EUA will remain in effect (meaning this test can be used) for the duration of the COVID-19 declaration under Section 564(b)(1) of the Act, 21 U.S.C. section 360bbb-3(b)(1), unless the authorization is terminated or revoked.     Resp Syncytial Virus by PCR NEGATIVE NEGATIVE    Comment: (NOTE) Fact Sheet for Patients: BloggerCourse.com  Fact Sheet for Healthcare Providers: SeriousBroker.it  This test is not yet approved or cleared by the [place] FDA and has been authorized for detection and/or diagnosis of SARS-CoV-2 by FDA under an Emergency Use Authorization  (EUA). This EUA will remain in effect (meaning this test can be used) for the duration of the COVID-19 declaration under Section 564(b)(1) of the Act, 21 U.S.C. section 360bbb-3(b)(1), unless the authorization is terminated or revoked.  Performed at [facility], [address], [place], [place] [address]   Culture, blood (Routine X 2) w Reflex to ID Panel     Status: None (Preliminary result)   Collection Time: 02/05/22 11:12 PM   Specimen: BLOOD LEFT FOREARM  Result Value Ref Range   Specimen Description BLOOD LEFT FOREARM    Special Requests      BOTTLES DRAWN AEROBIC AND ANAEROBIC Blood Culture adequate  volume   Culture      NO GROWTH 2 DAYS Performed at [facility], [place] [address], [place], [place] [address]    Report Status PENDING   Basic metabolic panel     Status: Abnormal   Collection Time: 02/05/22 11:45 PM  Result Value Ref Range   Sodium 134 (L) 135 - 145 mmol/L   Potassium 2.1 (LL) 3.5 - 5.1 mmol/L    Comment: CRITICAL RESULT CALLED TO, READ BACK BY AND VERIFIED WITH Ashantae Pangallo PREYER RN 02/06/22 0015 Wiliam Ke   Chloride 98 98 - 111 mmol/L   CO2 25 22 - 32 mmol/L   Glucose, Bld 106 (H) 70 - 99 mg/dL    Comment: Glucose reference range applies only to samples taken after fasting for at least 8 hours.   BUN <5 (L) 6 - 20 mg/dL   Creatinine, Ser 1.06 0.61 - 1.24 mg/dL   Calcium 8.2 (L) 8.9 - 10.3 mg/dL   GFR, Estimated >60 >60 mL/min    Comment: (NOTE) Calculated using the CKD-EPI Creatinine Equation (2021)    Anion gap 11 5 - 15    Comment: Performed at [facility] [address], [place], [place] [id number]  Basic metabolic panel     Status: Abnormal   Collection Time: 02/06/22  1:42 AM  Result Value Ref Range   Sodium 135 135 - 145 mmol/L   Potassium 2.2 (LL) 3.5 - 5.1 mmol/L    Comment: CRITICAL RESULT CALLED TO, READ BACK BY AND VERIFIED WITH WHITNEY EZELL RN 02/06/22 0242 M KOROLESKI   Chloride 98 98 - 111 mmol/L   CO2 24 22 - 32  mmol/L   Glucose, Bld 107 (H) 70 - 99 mg/dL    Comment: Glucose reference range applies only to samples taken after fasting for at least 8 hours.   BUN <5 (L) 6 - 20 mg/dL   Creatinine, Ser 1.03 0.61 - 1.24 mg/dL   Calcium 8.1 (L) 8.9 - 10.3 mg/dL   GFR, Estimated >60 >60 mL/min    Comment: (NOTE) Calculated using the CKD-EPI Creatinine Equation (2021)    Anion gap 13 5 - 15    Comment: Performed at [facility] [address] [place], [place] [id number]  Basic metabolic panel     Status: Abnormal   Collection Time: 02/06/22  4:09 AM  Result Value Ref Range   Sodium 136 135 - 145 mmol/L   Potassium <2.0 (LL) 3.5 - 5.1 mmol/L    Comment: CRITICAL RESULT CALLED TO, READ BACK BY AND VERIFIED WITH WHITNEY EZELL RN 02/06/22 0513 M KOROLESKI   Chloride 99 98 - 111 mmol/L   CO2 26 22 - 32 mmol/L   Glucose, Bld 105 (H) 70 - 99 mg/dL    Comment: Glucose reference range applies only to samples taken after fasting for at least 8 hours.   BUN <5 (L) 6 - 20 mg/dL   Creatinine, Ser 0.96 0.61 - 1.24 mg/dL   Calcium 8.0 (L) 8.9 - 10.3 mg/dL   GFR, Estimated >60 >60 mL/min    Comment: (NOTE) Calculated using the CKD-EPI Creatinine Equation (2021)    Anion gap 11 5 - 15    Comment: Performed at [facility] [address] [place], [place] [address]  CK     Status: Abnormal   Collection Time: 02/06/22  4:09 AM  Result Value Ref Range   Total CK 1,512 (H) 49 - 397 U/L  Comment: Performed at [facility], [place] [address], [place], [place] [address]  T4, free     Status: None   Collection Time: 02/06/22  4:09 AM  Result Value Ref Range   Free T4 0.74 0.61 - 1.12 ng/dL    Comment: (NOTE) Biotin ingestion may interfere with free T4 tests. If the results are inconsistent with the TSH level, previous test results, or the clinical presentation, then consider biotin interference. If needed, order repeat testing after stopping biotin. Performed at [facility], [place] [address], [place], [name] [address]   Hepatic function panel     Status: Abnormal   Collection Time: 02/06/22  4:09 AM  Result Value Ref Range   Total Protein 6.5 6.5 - 8.1 g/dL   Albumin 3.4 (L) 3.5 - 5.0 g/dL   AST 162 (H) 15 - 41 U/L   ALT 51 (H) 0 - 44 U/L   Alkaline Phosphatase 105 38 - 126 U/L   Total Bilirubin 1.2 0.3 - 1.2 mg/dL   Bilirubin, Direct 0.4 (H) 0.0 - 0.2 mg/dL   Indirect Bilirubin 0.8 0.3 - 0.9 mg/dL    Comment: Performed at [facility] [address] [place], [place] [address]  Culture, blood (Routine X 2) w Reflex to ID Panel     Status: None (Preliminary result)   Collection Time: 02/06/22  5:55 AM   Specimen: BLOOD  Result Value Ref Range   Specimen Description BLOOD RIGHT ANTECUBITAL    Special Requests      BOTTLES DRAWN AEROBIC AND ANAEROBIC Blood Culture results may not be optimal due to an inadequate volume of blood received in culture bottles   Culture      NO GROWTH 1 DAY Performed at [facility] [address], [place], [place] [address]    Report Status PENDING   Na and K (sodium & potassium), rand urine     Status: None   Collection Time: 02/06/22  7:37 AM  Result Value Ref Range   Sodium, Ur 27 mmol/L   Potassium Urine 3 mmol/L    Comment: Performed at [facility] [address] [place], [place] [id number]  Basic metabolic panel     Status: Abnormal   Collection Time: 02/06/22  7:37 AM  Result Value Ref Range   Sodium 136 135 - 145 mmol/L   Potassium 3.8 3.5 - 5.1 mmol/L   Chloride 105 98 - 111 mmol/L   CO2 21 (L) 22 - 32 mmol/L   Glucose, Bld 119 (H) 70 - 99 mg/dL    Comment: Glucose reference range applies only to samples taken after fasting for at least 8 hours.   BUN <5 (L) 6 - 20 mg/dL   Creatinine, Ser 0.92 0.61 - 1.24 mg/dL   Calcium 8.1 (L) 8.9 - 10.3 mg/dL   GFR, Estimated >60 >60 mL/min    Comment: (NOTE) Calculated using the CKD-EPI Creatinine Equation (2021)    Anion gap 10 5 - 15    Comment:  Performed at [facility] [address] [place], [place] [address]  Magnesium     Status: None   Collection Time: 02/06/22  7:37 AM  Result Value Ref Range   Magnesium 1.7 1.7 - 2.4 mg/dL    Comment: Performed at [facility] [address], [place], [place] [address]  Osmolality, urine     Status: Abnormal   Collection Time: 02/06/22  7:37 AM  Result Value Ref Range   Osmolality, Ur 91 (L) 300 - 900 mOsm/kg    Comment: REPEATED TO VERIFY Performed at [facility], [place] [place], [place] [address]   HIV Antibody (routine testing w rflx)     Status: None   Collection Time: 02/06/22  4:09 PM  Result Value Ref Range   HIV Screen 4th Generation wRfx Non Reactive Non Reactive    Comment: Performed at [facility], [place] [address] [place], [place] [id number]  Basic metabolic panel     Status: Abnormal   Collection Time: 02/06/22  4:32 PM  Result Value Ref Range   Sodium 136 135 - 145 mmol/L   Potassium 2.6 (LL) 3.5 - 5.1 mmol/L    Comment: CRITICAL RESULT CALLED TO, READ BACK BY AND VERIFIED WITH: D. JOHNSON RN 02/06/22 @1730  BY J. WHITE    Chloride 103 98 - 111 mmol/L   CO2 24 22 - 32 mmol/L   Glucose, Bld 108 (H) 70 - 99 mg/dL    Comment: Glucose reference range applies only to samples taken after fasting for at least 8 hours.   BUN 6 6 - 20 mg/dL   Creatinine, Ser 0.90 0.61 - 1.24 mg/dL   Calcium 8.0 (L) 8.9 - 10.3 mg/dL   GFR, Estimated >60 >60 mL/min    Comment: (NOTE) Calculated using the CKD-EPI Creatinine Equation (2021)    Anion gap 9 5 - 15    Comment: Performed at [facility] [address] [place], [place] [address]  Osmolality     Status: None   Collection Time: 02/06/22  4:32 PM  Result Value Ref Range   Osmolality 288 275 - 295 mOsm/kg    Comment: RESULT REPEATED AND VERIFIED Performed at [facility], [place] [place], [place] [address]   Magnesium     Status: Abnormal   Collection Time:  02/06/22  4:32 PM  Result Value Ref Range   Magnesium 1.6 (L) 1.7 - 2.4 mg/dL    Comment: Performed at [facility] [address], [place], [place] [address]  TSH     Status: None   Collection Time: 02/06/22  4:32 PM  Result Value Ref Range   TSH 3.850 0.350 - 4.500 uIU/mL    Comment: Performed by a 3rd Generation assay with a functional sensitivity of <=0.01 uIU/mL. Performed at [facility], [place] [address], [place], [place] [address]   Ammonia     Status: Abnormal   Collection Time: 02/07/22  4:18 AM  Result Value Ref Range   Ammonia 47 (H) 9 - 35 umol/L    Comment: Performed at [facility], [place] [address] [place], [place] [address]  Magnesium     Status: None   Collection Time: 02/07/22  4:18 AM  Result Value Ref Range   Magnesium 1.7 1.7 - 2.4 mg/dL    Comment: Performed at [facility] [address], [place], [place] [address]  Comprehensive metabolic panel     Status: Abnormal   Collection Time: 02/07/22  4:18 AM  Result Value Ref Range   Sodium 138 135 - 145 mmol/L   Potassium 2.4 (LL) 3.5 - 5.1 mmol/L    Comment: ATTEMPTED CALL TO [phone] 02/07/22 0526 M KOROLESKI. PHONE RINGS ONCE THEN DISCONNECTS ATTEMPTED CALL TO [place] 4600 02/07/22 [place]. PHONE STILL DISCONNECTS AFTER FIRST RING MESSAGED RN IN EPIC TO [phone] 02/07/22  [place] RESULT CALLED TO, READ BACK BY AND VERIFIED WITH EDITH CALDERON RN 02/07/22 0553 M KOROLESKI    Chloride 105 98 - 111 mmol/L   CO2 24 22 - 32 mmol/L   Glucose, Bld 95 70 - 99 mg/dL    Comment: Glucose reference range applies only to samples taken after fasting for at least 8 hours.   BUN <5 (L) 6 - 20 mg/dL   Creatinine, Ser 0.78 0.61 - 1.24 mg/dL   Calcium 7.8 (L) 8.9 - 10.3 mg/dL   Total Protein 6.0 (L) 6.5 - 8.1 g/dL   Albumin 3.1 (L) 3.5 - 5.0 g/dL   AST 101 (H) 15 - 41 U/L   ALT 42 0 - 44 U/L   Alkaline Phosphatase 88 38 - 126 U/L   Total Bilirubin 1.2 0.3 - 1.2 mg/dL    GFR, Estimated >60 >60 mL/min    Comment: (NOTE) Calculated using the CKD-EPI Creatinine Equation (2021)    Anion gap 9 5 - 15    Comment: Performed at [facility], [place] [address], [place], [place] [address]  Phosphorus     Status: None   Collection Time: 02/07/22  4:18 AM  Result Value Ref Range   Phosphorus 3.1 2.5 - 4.6 mg/dL    Comment: Performed at [facility] [address], [place], [place] [address]    MR BRAIN WO CONTRAST  Result Date: 02/06/2022 CLINICAL DATA:  Seizure-like activity. EXAM: MRI HEAD WITHOUT CONTRAST TECHNIQUE: Multiplanar, multiecho pulse sequences of the brain and surrounding structures were obtained without intravenous contrast. COMPARISON:  CT head 1 day prior. FINDINGS: Brain: There is no acute intracranial hemorrhage, extra-axial fluid collection, or acute infarct Parenchymal volume is normal. The ventricles are normal in size. Gray-white differentiation is preserved. There is no structural or migration abnormality. The corpus callosum is normal. The pituitary and suprasellar region are normal. The pattern of myelination is normal. The hippocampi are normal in signal and architecture. There is no mass lesion there is no mass effect or midline shift. Vascular: Normal flow voids. Skull and upper cervical spine: Normal marrow signal. Sinuses/Orbits: The paranasal sinuses are clear. The globes and orbits are unremarkable. Other: None. IMPRESSION: Normal brain MRI Electronically Signed   By: Valetta Mole M.D.   On: 02/06/2022 18:52   EEG adult  Result Date: 02/06/2022 Derek Jack, MD     02/06/2022  3:41 PM Routine EEG Report Onofre [place] is a 41 y.o. male with a history of seizure who is undergoing an EEG to evaluate for seizures. Report: This EEG was acquired with electrodes placed according to the International 10-20 electrode system (including Fp1, Fp2, F3, F4, C3, C4, P3, P4, O1, O2, T3, T4, T5, T6, A1, A2, Fz, Cz, Pz). The following  electrodes were missing or displaced: none. The occipital dominant rhythm was 9 Hz. This activity is reactive to stimulation. Drowsiness was manifested by background fragmentation; deeper stages of sleep were not identified. There was no focal slowing. There were no interictal epileptiform discharges. There were no electrographic seizures identified. Photic stimulation and hyperventilation were not performed. Impression: This EEG was obtained while awake and drowsy and is normal.   Clinical Correlation: Normal EEGs, however, do not rule out epilepsy. Su Monks, MD [facility] [phone] If 7pm- 7am, please page neurology on call as listed in [place].   DG Chest 2 View  Result Date: 02/06/2022 CLINICAL DATA:  Fever EXAM: CHEST - 2 VIEW COMPARISON:  Radiographs 10/21/2021 FINDINGS: Low lung volumes. The heart size and mediastinal contours are within normal limits. Both lungs are clear. The visualized skeletal structures are unremarkable. IMPRESSION: No active cardiopulmonary disease. Electronically Signed   By: Placido Sou M.D.   On: 02/06/2022 01:49   CT Head Wo Contrast  Result Date: 02/05/2022 CLINICAL DATA:  SEIZURE, ETOH ABUSE, FALL EXAM: CT HEAD WITHOUT CONTRAST TECHNIQUE: Contiguous axial images were obtained from the base of the skull through the vertex without intravenous contrast. RADIATION DOSE REDUCTION: This exam was performed according to the departmental dose-optimization program which includes automated exposure control, adjustment of the mA and/or kV according to patient size and/or use of iterative reconstruction technique. COMPARISON:  CT head 10/21/2021. FINDINGS: Brain: No evidence of acute infarction, hemorrhage, hydrocephalus, extra-axial collection or mass lesion/mass effect. Vascular: No hyperdense vessel identified. Skull: No acute fracture. Sinuses/Orbits: Largely clear sinuses.  No acute orbital findings. Other: No mastoid effusions. IMPRESSION: No evidence of  acute intracranial abnormality. Electronically Signed   By: Margaretha Sheffield M.D.   On: 02/05/2022 18:51    Assessment/Plan  Hypokalemia: severe, ongoing despite aggressive repletion.  Urine lytes don't suggest K wasting (K was 3) and urine seemed maximally dilute.  His BUN is really low for body mass and Cr is normal.   - combined- this looks like poor nutrition but should be corrected by now with the sig K repletion  - ? If his HTN causes K wasting too- it's possible   - send renin-aldosterone  - place on a tiny bit of amiloride 5 mg daily--> I'll stop amlodipine in favor of that just so pressure don't dip too low  - continue to follow  2.  Seizures:  - keppra increased  3.  EtOH abuse  - CIWA  Madelon Lips 02/07/2022, 2:26 PM

## 2022-02-07 NOTE — Progress Notes (Signed)
   02/07/22 0613  CIWA-Ar  Pulse Rate (!) 111  Nausea and Vomiting 0  Tactile Disturbances 0  Tremor 0  Auditory Disturbances 0  Paroxysmal Sweats 0  Visual Disturbances 0  Anxiety 1  Headache, Fullness in Head 0  Agitation 0  Orientation and Clouding of Sensorium 0  CIWA-Ar Total 1     Concerned with patient denying symptoms. Will monitor closely.

## 2022-02-07 NOTE — Progress Notes (Signed)
Patient continues with erratic and impulsive behavior. Attempted leaving the bed, removing all equipment, and urinating the bed.   Bed changed, safety ensured.

## 2022-02-07 NOTE — Progress Notes (Signed)
Critical lab value K 2.6, reported to [facility] Medicine.

## 2022-02-07 NOTE — Progress Notes (Addendum)
Daily Progress Note Intern Pager: [phone]  Patient name: Devon Hanson Medical record number: [id number] Date of birth: [birth year] Age: 41 y.o. Gender: male  Primary Care Provider: Lauree Chandler, NP Consultants: Neuro (s/o 12/23) Code Status: full  Pt Overview and Major Events to Date:  12/22: admitted   Assessment and Plan:  Devon Hanson is a 41 y.o. male who presented with seizure activity. Pertinent PMH/PSH includes alcohol use disorder with history of withdrawal, prior seizure, a-fib s/p cardioversion, HTN and anxiety.   * Hypokalemia K still 2.4 this morning after receiving a total of 356mEq potassium since admission (220 mEq PO and 154mEq IV). Etiology unclear--does have mild GI losses and chronic alcohol use but this is profound. Does not seem to be losing potassium in urine as urine osm were low. TSH/T4 normal. Normal neuro exam. No significant vital sign abnormalities to suggest catecholamine issue. Overall puzzling. Doubt refeeding although wife does report poor PO intake and weight loss. -Has an additional 30mEq IV and 79mEq PO potassium ordered for this morning -pm BMP, Mg -f/u serum osm -continue telemetry -consider nephro consult  Alcohol withdrawal syndrome with complication (Devon Hanson) Last drink 12/22 with ethanol <10 on admission. Received 3mg  Ativan overnight for elevated CIWAs. No evidence of withdrawal on my exam this morning. -Continue CIWA monitoring q4h with prn Ativan -Atarax 25 mg q6hrs prn for anxiety or CIWA </= 10 as per protocol  -Continue thiamine, folic acid, multivitamin -f/u thiamine level  Seizure (Devon Hanson) Presented with seizure activity x2. History of the same 1 month ago and in September. Etiology likely related to missed Keppra dose and alcohol withdrawal. MRI brain and EEG unremarkable. -Neuro signed off, appreciate their recommendations -Continue Keppra at increased dose -Seizure precautions  Transaminitis Improving, ALT now  wnl, AST still elevated to 101, likely secondary to alcohol use and possibly component from seizure as well. -Trend CMP -Tylenol ok if needed, keep daily dose less than 2g  Fever Fever of 101.3 on admission which resolved spontaneously. Afebrile since that time. ?viral GI illness vs autonomic effects of alcohol withdrawal vs erroneous reading -f/u blood cultures and urine cultures -f/u GI pathogen panel  Hypocalcemia Ca corrects to 8.5. Consider checking PTH outpatient  Chronic and stable conditions: HTN: continue home amlodipine 10mg  daily A-fib s/p cardioversion in 2019 without recurrence: continue home metoprolol 50mg  daily Anxiety: continue home lexapro 10mg  daily  FEN/GI: Regular diet PPx: Lovenox Dispo:Home pending clinical improvement . Barriers include ongoing electrolyte derangement.   Subjective:  Patient with bizarre behavior overnight per nursing staff. He is calm and appropriate on my exam this morning. Denies specific complaints. Knows he's here for seizure activity. Reports 1 bowel movement overnight but otherwise no diarrhea. He endorses poor PO intake prior to hospitalization-- just doesn't have an appetite. Also notes his cousin died a few weeks ago and that has been hard because she "was more like a sister".   Spoke with patient's wife via phone. She confirms same history that patient provides. Did not witness the seizures prior to arrival, but per her aunt who witnessed the episode, he was hollering out and his arms were flailing. Wife reports patient drinks alcohol every day, a fifth of liquor will last 1-3 days at the most. She also states patient has lost ~30 lbs over past 1-2 months due to not eating.  Objective: Temp:  [98.2 F (36.8 C)-99.1 F (37.3 C)] 98.2 F (36.8 C) (12/24 0822) Pulse Rate:  [78-152] 87 (12/24 0822) Resp:  [  16-20] 20 (12/24 0822) BP: (117-156)/(74-109) 124/96 (12/24 0822) SpO2:  [94 %-100 %] 99 % (12/24 3202) Physical Exam: General:  alert, resting comfortably in bed, NAD Cardiovascular: RRR, normal S1/S2 without m/r/g Respiratory: normal effort, lungs CTAB Abdomen: soft, NTND Extremities: no peripheral edema Neuro: A&O x3, CN intact, normal speech, moves all extremities equally Psych: somewhat flat affect but normal mood  Laboratory: Most recent CBC Lab Results  Component Value Date   WBC 5.6 02/05/2022   HGB 12.4 (L) 02/05/2022   HCT 33.1 (L) 02/05/2022   MCV 95.7 02/05/2022   PLT 228 02/05/2022   Most recent BMP    Latest Ref Rng & Units 02/07/2022    4:18 AM  BMP  Glucose 70 - 99 mg/dL 95   BUN 6 - 20 mg/dL <5   Creatinine 3.34 - 1.24 mg/dL 3.56   Sodium 861 - 683 mmol/L 138   Potassium 3.5 - 5.1 mmol/L 2.4   Chloride 98 - 111 mmol/L 105   CO2 22 - 32 mmol/L 24   Calcium 8.9 - 10.3 mg/dL 7.8    Other pertinent labs Magnesium 1.7  Urine osmolality 91, urine potassium 3  Imaging/Diagnostic Tests: MR BRAIN WO CONTRAST Result Date: 02/06/2022 IMPRESSION: Normal brain MRI  Electronically Signed   By: Lesia Hausen M.D.   On: 02/06/2022 18:52   EEG adult Result Date: 02/06/2022 Impression: This EEG was obtained while awake and drowsy and is normal.   Clinical Correlation: Normal EEGs, however, do not rule out epilepsy.  Bing Neighbors, MD [facility] [phone] If 7pm- 7am, please page neurology on call as listed in AMION.      Maury Dus, MD 02/07/2022, 10:05 AM  PGY-3, [facility] Family Medicine FPTS Intern pager: [phone], text pages welcome Secure chat group [facility] Teaching Service

## 2022-02-08 DIAGNOSIS — E876 Hypokalemia: Secondary | ICD-10-CM

## 2022-02-08 LAB — COMPREHENSIVE METABOLIC PANEL
ALT: 37 U/L (ref 0–44)
AST: 86 U/L — ABNORMAL HIGH (ref 15–41)
Albumin: 3 g/dL — ABNORMAL LOW (ref 3.5–5.0)
Alkaline Phosphatase: 83 U/L (ref 38–126)
Anion gap: 8 (ref 5–15)
BUN: <5 mg/dL — ABNORMAL LOW (ref 6–20)
CO2: 26 mmol/L (ref 22–32)
Calcium: 8 mg/dL — ABNORMAL LOW (ref 8.9–10.3)
Chloride: 104 mmol/L (ref 98–111)
Creatinine, Ser: 0.79 mg/dL (ref 0.61–1.24)
GFR, Estimated: >60 mL/min (ref >60)
Glucose, Bld: 101 mg/dL — ABNORMAL HIGH (ref 70–99)
Potassium: 3.2 mmol/L — ABNORMAL LOW (ref 3.5–5.1)
Sodium: 138 mmol/L (ref 135–145)
Total Bilirubin: 0.8 mg/dL (ref 0.3–1.2)
Total Protein: 5.9 g/dL — ABNORMAL LOW (ref 6.5–8.1)

## 2022-02-08 LAB — MAGNESIUM: Magnesium: 1.6 mg/dL — ABNORMAL LOW (ref 1.7–2.4)

## 2022-02-08 MED ORDER — AMILORIDE HCL 5 MG PO TABS: 5.0000 mg | ORAL_TABLET | Freq: Every day | ORAL | 0 refills | Status: DC

## 2022-02-08 MED ORDER — POTASSIUM CHLORIDE CRYS ER 20 MEQ PO TBCR: EXTENDED_RELEASE_TABLET | ORAL | 0 refills | Status: DC

## 2022-02-08 MED ORDER — AMLODIPINE BESYLATE 10 MG PO TABS: 5.0000 mg | ORAL_TABLET | Freq: Every day | ORAL | 0 refills | Status: DC

## 2022-02-08 MED ORDER — MAGNESIUM SULFATE IN D5W 1-5 GM/100ML-% IV SOLN
1.0000 g | Freq: Once | INTRAVENOUS | Status: AC
  Administered 2022-02-08: 1 g via INTRAVENOUS
  Filled 2022-02-08: qty 100

## 2022-02-08 MED ORDER — POTASSIUM CHLORIDE 20 MEQ PO PACK
40.0000 meq | PACK | Freq: Once | ORAL | Status: AC
  Administered 2022-02-08: 40 meq via ORAL
  Filled 2022-02-08: qty 2

## 2022-02-08 MED ORDER — LEVETIRACETAM 750 MG PO TABS: 750.0000 mg | ORAL_TABLET | Freq: Two times a day (BID) | ORAL | 0 refills | Status: DC

## 2022-02-08 MED ORDER — POTASSIUM CHLORIDE CRYS ER 20 MEQ PO TBCR
40.0000 meq | EXTENDED_RELEASE_TABLET | Freq: Once | ORAL | Status: AC
  Administered 2022-02-08: 40 meq via ORAL
  Filled 2022-02-08: qty 2

## 2022-02-08 NOTE — Discharge Instructions (Addendum)
Dear Devon Hanson,  Thank you for letting us participate in your care. You were hospitalized for Hypokalemia and seizures. Some of your medications have changed.  POST-HOSPITAL & CARE INSTRUCTIONS Please review your updated medication list Please call your PCP to set up an appointment as soon as possible Go to your follow up appointments (listed below)   DOCTOR'S APPOINTMENT   Future Appointments  Date Time Provider Department Center  03/18/2022 10:45 AM August Saucer, Corrie Mckusick, MD [facility] None    Follow-up Information     Sharon Seller, NP. Schedule an appointment as soon as possible for a visit in 3 day(s).   Specialty: Geriatric Medicine Contact information: [address] [place] [address] [phone]                 Take care and be well!  Family Medicine Teaching Service Inpatient Team [place]  [facility]  [address] [place], [place] [address] [phone]

## 2022-02-08 NOTE — Assessment & Plan Note (Signed)
On Norvasc 10mg  at home. Held initially after switching to amiloride for hypokalemia as above. Pressures continued to rise after this, **

## 2022-02-08 NOTE — Discharge Summary (Addendum)
Family Medicine Teaching [facility] Discharge Summary  Patient name: Devon Hanson Medical record number: [id number] Date of birth: 12/30/1980 Age: 41 y.o. Gender: male Date of Admission: 02/05/2022  Date of Discharge: 02/08/22 Admitting Physician: Lockie Mola, MD  Primary Care Provider: Sharon Seller, NP Consultants: Nephrology  Indication for Hospitalization: Seizures, hypokalemia  Discharge Diagnoses/Problem List:  Principal Problem for Admission: hypokalemia Other Problems addressed during stay:  Principal Problem:   Hypokalemia Active Problems:   Alcohol withdrawal syndrome with complication (HCC)   Seizure (HCC)   Transaminitis   Fever   Essential hypertension   Alcohol withdrawal [facility])   Hypocalcemia  Brief Hospital Course:  Devon Hanson is a 41 y.o. male who presented with seizure activity. PMH significant for alcohol use disorder, prior seizure, a-fib s/p cardioversion, HTN, and anxiety. Hospital course is outlined below:  Seizure Presented with 2 episodes of witnessed seizure activity.  ?due to alcohol withdrawal vs underlying seizure disorder. Neuro was consulted, recommended increasing his home Keppra and thought seizure 2/2 poor medication compliance. EEG and MRI negative.   Hypokalemia Patient found to have severe hypokalemia with K<2.0. This persisted despite aggressive oral supplementation. Nephrology consulted. Thought to be due to ?EtOH vs dietary vs HTN. Started on amiloride 5mg . Renin-aldosterone lab still in process on discharge.  Patient was discharged with potassium supplementation.  Alcohol Use Disorder Patient came in w/ seizure concerning for EtOH withdrawal, but thought to be 2/2 medication noncompliance. Patient EtOH level < 10 on admission. Patient was placed on CIWA protocol and had no seizures or events concerning for withdrawal.  Fever Initially p/w fever which spontaneously resolved. Remained afebrile during his stay.  Blood and urine cx NGTD.  PCP follow Up recommendations: Recheck BMP and Mg Started on amiloride 5mg  qd. Consider increasing to 10 based on BMP. Amlodipine decreased from 10mg  to 5mg  qd, f/u BP and can titrate back up as needed. F/u renin and aldosterone lab - if abnormal, consider nephro referral  Disposition: home  Discharge Condition: stable, improved  Discharge Exam:  Vitals:   02/08/22 0843 02/08/22 1157  BP: (!) 151/106 (!) 128/92  Pulse: 80 90  Resp: 17 16  Temp: 98.7 F (37.1 C) 98.4 F (36.9 C)  SpO2: 100% 99%   General: NAD, resting comfortably, appropriately responsive Cardiovascular: RRR Respiratory: Normal WOB on RA, CTAB Abdomen: Soft, NT/ND Extremities: No peripheral edema noted  Significant Procedures: n/a  Significant Labs and Imaging:  No results for input(s): "WBC", "HGB", "HCT", "PLT" in the last 48 hours. Recent Labs  Lab 02/06/22 1632 02/07/22 0418 02/07/22 1435 02/08/22 0218  NA 136 138 138 138  K 2.6* 2.4* 3.1* 3.2*  CL 103 105 104 104  CO2 24 24 24 26   GLUCOSE 108* 95 101* 101*  BUN 6 <5* <5* <5*  CREATININE 0.90 0.78 0.73 0.79  CALCIUM 8.0* 7.8* 8.1* 8.0*  MG 1.6* 1.7 1.5* 1.6*  PHOS  --  3.1  --   --   ALKPHOS  --  88  --  83  AST  --  101*  --  86*  ALT  --  42  --  37  ALBUMIN  --  3.1*  --  3.0*    Imaging CT HEAD WO CONTRAST IMPRESSION: No evidence of acute intracranial abnormality.  CXR IMPRESSION: No active cardiopulmonary disease.   MR BRAIN IMPRESSION: Normal brain MRI  Results/Tests Pending at Time of Discharge: renin-aldosterone  Discharge Medications:  Allergies as of 02/08/2022   No  Known Allergies      Medication List     STOP taking these medications    potassium chloride 10 MEQ tablet Commonly known as: KLOR-CON       TAKE these medications    aMILoride 5 MG tablet Commonly known as: MIDAMOR Take 1 tablet (5 mg total) by mouth daily. Start taking on: February 09, 2022   amLODipine 10  MG tablet Commonly known as: NORVASC Take 0.5 tablets (5 mg total) by mouth daily. What changed: how much to take   escitalopram 10 MG tablet Commonly known as: LEXAPRO TAKE 1 TABLET BY MOUTH EVERY DAY   ibuprofen 800 MG tablet Commonly known as: ADVIL Take 1 tablet (800 mg total) by mouth every 8 (eight) hours as needed for moderate pain.   levETIRAcetam 750 MG tablet Commonly known as: KEPPRA Take 1 tablet (750 mg total) by mouth 2 (two) times daily. What changed:  medication strength how much to take   metoprolol succinate 50 MG 24 hr tablet Commonly known as: TOPROL-XL Take 50 mg by mouth daily.   pantoprazole 40 MG tablet Commonly known as: PROTONIX Take 40 mg by mouth daily.   potassium chloride SA 20 MEQ tablet Commonly known as: KLOR-CON M To take 2 tablets today and 2 tablets tomorrow to replace potassium What changed:  how much to take how to take this when to take this additional instructions       Discharge Instructions: Please refer to Patient Instructions section of EMR for full details.  Patient was counseled important signs and symptoms that should prompt return to medical care, changes in medications, dietary instructions, activity restrictions, and follow up appointments.   Follow-Up Appointments:  Follow-up Information     Lauree Chandler, NP. Schedule an appointment as soon as possible for a visit in 3 day(s).   Specialty: Geriatric Medicine Contact information: [address]. [place] [address] [phone]                August Albino, MD 02/08/2022, 12:39 PM PGY-1, [place] Upper-Level Resident Addendum   I have independently interviewed and examined the patient. I have discussed the above with Dr. Joeseph Amor and agree with the documented plan. My edits for correction/addition/clarification are included above. Please see any attending notes.   Wells Guiles, DO PGY-3, [place] [facility] 02/08/2022 2:19 PM  FPTS Service pager: [phone] (text pages welcome through [facility])

## 2022-02-08 NOTE — Progress Notes (Signed)
Mobility Specialist Progress Note   02/08/22 1300  Mobility  Activity Ambulated independently in hallway  Level of Assistance Independent after set-up  Assistive Device None  Distance Ambulated (ft) 450 ft  Activity Response Tolerated well  $Mobility charge 1 Mobility   Received pt in bed having no complaints and agreeable to mobility. Pt was asymptomatic throughout ambulation and returned to room w/o fault. Left in bed w/ call bell in reach and all needs met.  Holland Falling Mobility Specialist Please contact via SecureChat or  Rehab office at [phone]

## 2022-02-08 NOTE — TOC Transition Note (Signed)
Transition of Care [facility]) - CM/SW Discharge Note   Patient Details  Name: Devon Hanson MRN: [id number] Date of Birth: [birth year]  Transition of Care [facility]) CM/SW Contact:  Tom-Johnson, Hershal Coria, RN Phone Number: 02/08/2022, 2:22 PM   Clinical Narrative:     Patient is scheduled for discharge today. Substance abuse educational materials offered to patient and declined at this time. Family to transport at discharge. No further TOC needs noted.      Final next level of care: Home/Self Care Barriers to Discharge: Barriers Resolved   Patient Goals and CMS Choice CMS Medicare.gov Compare Post Acute Care list provided to:: Patient Choice offered to / list presented to : NA  Discharge Placement                  Patient to be transferred to facility by: Family      Discharge Plan and Services Additional resources added to the After Visit Summary for                  DME Arranged: N/A DME Agency: NA       HH Arranged: NA HH Agency: NA        Social Determinants of Health (SDOH) Interventions SDOH Screenings   Depression (PHQ2-9): Medium Risk (11/16/2021)  Tobacco Use: Low Risk  (02/05/2022)     Readmission Risk Interventions    02/08/2022    2:19 PM  Readmission Risk Prevention Plan  Transportation Screening Complete  PCP or Specialist Appt within 5-7 Days Complete  Home Care Screening Complete  Medication Review (RN CM) Complete

## 2022-02-08 NOTE — Progress Notes (Signed)
Pt discharged home. 2 PIV's removed. Pt sent with cell phone, charger, sneakers, flip flops, and jacket that he came in with. Pt has no questions about discharge instructions.   Robina Ade, RN

## 2022-02-08 NOTE — Progress Notes (Signed)
Cage aid assessment done and educational material offered to patient.

## 2022-02-09 ENCOUNTER — Telehealth: Payer: Self-pay | Admitting: *Deleted

## 2022-02-09 NOTE — Patient Outreach (Signed)
  Care Coordination [facility] Note Transition Care Management Unsuccessful Follow-up Telephone Call  Date of discharge and from where:  [facility] [phone]  Attempts:  1st Attempt  Reason for unsuccessful TCM follow-up call:  Left voice message  Gean Maidens BSN RN [facility] [phone]

## 2022-02-10 ENCOUNTER — Telehealth: Payer: Self-pay | Admitting: *Deleted

## 2022-02-10 LAB — CULTURE, BLOOD (ROUTINE X 2)
Culture: NO GROWTH
Special Requests: ADEQUATE

## 2022-02-10 NOTE — Patient Outreach (Signed)
  Care Coordination [facility] Note Transition Care Management Unsuccessful Follow-up Telephone Call  Date of discharge and from where:  [facility] [phone]  Attempts:  2nd Attempt  Reason for unsuccessful TCM follow-up call:  Voice mail full   Gean Maidens BSN RN [facility] [phone]

## 2022-02-11 ENCOUNTER — Telehealth: Payer: Self-pay | Admitting: *Deleted

## 2022-02-11 LAB — CULTURE, BLOOD (ROUTINE X 2): Culture: NO GROWTH

## 2022-02-11 NOTE — Patient Outreach (Signed)
  Care Coordination [facility] Note Transition Care Management Unsuccessful Follow-up Telephone Call  Date of discharge and from where:  [facility] [phone]  Attempts:  3rd Attempt  Reason for unsuccessful TCM follow-up call:  Voice mail full Gean Maidens BSN RN [facility] Care [facility] [phone]

## 2022-02-12 ENCOUNTER — Other Ambulatory Visit: Payer: Self-pay | Admitting: Nurse Practitioner

## 2022-02-12 DIAGNOSIS — F411 Generalized anxiety disorder: Secondary | ICD-10-CM

## 2022-02-13 ENCOUNTER — Other Ambulatory Visit: Payer: Self-pay | Admitting: Nurse Practitioner

## 2022-02-13 DIAGNOSIS — I10 Essential (primary) hypertension: Secondary | ICD-10-CM

## 2022-02-17 ENCOUNTER — Ambulatory Visit (INDEPENDENT_AMBULATORY_CARE_PROVIDER_SITE_OTHER): Payer: 59 | Admitting: Family

## 2022-02-17 ENCOUNTER — Encounter: Payer: Self-pay | Admitting: Family

## 2022-02-17 VITALS — BP 130/78 | HR 95 | Temp 98.0°F | Resp 16 | Ht 70.0 in | Wt 191.6 lb

## 2022-02-17 DIAGNOSIS — I10 Essential (primary) hypertension: Secondary | ICD-10-CM | POA: Diagnosis not present

## 2022-02-17 DIAGNOSIS — R200 Anesthesia of skin: Secondary | ICD-10-CM

## 2022-02-17 DIAGNOSIS — R748 Abnormal levels of other serum enzymes: Secondary | ICD-10-CM | POA: Diagnosis not present

## 2022-02-17 DIAGNOSIS — E876 Hypokalemia: Secondary | ICD-10-CM

## 2022-02-17 DIAGNOSIS — R569 Unspecified convulsions: Secondary | ICD-10-CM

## 2022-02-17 DIAGNOSIS — R202 Paresthesia of skin: Secondary | ICD-10-CM

## 2022-02-17 DIAGNOSIS — R82998 Other abnormal findings in urine: Secondary | ICD-10-CM

## 2022-02-17 NOTE — Progress Notes (Signed)
Provider: Gwenevere Goga FNP-C  Lauree Chandler, NP  Patient Care Team: Lauree Chandler, NP as PCP - General (Geriatric Medicine)  Extended Emergency Contact Information Primary Emergency Contact: Rudean Hitt [place] Phone: [phone] Relation: Spouse Secondary Emergency Contact: [facility] Phone: [phone] Relation: Mother Preferred language: English Interpreter needed? No  Code Status:  Full Code  Goals of care: Advanced Directive information    02/17/2022    2:45 PM  Advanced Directives  Does Patient Have a Medical Advance Directive? No  Would patient like information on creating a medical advance directive? No - Patient declined     Chief Complaint  Patient presents with   Transitions Of Care    [facility] 02/06/2023-02/09/2023    HPI:  Pt is a 42 y.o. male seen today for an acute visit for  Transition of care post Hospitalization on 02/05/2022 - 02/08/2022 for seizures and Hypokalemia after he presented to the ED with seizure activity.Has medical history of seizure,Afib s/p cardioversion,Hypertension,Generalized anxiety disorder,GERD,Alcohol use among others. 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 CT head,MRI brain and CXR were all negative.Labs done indicated severe hypokalemia < 2.0 thought due to alcohol level < 10 on admission.AST 101.Nephrology was consulted.Potassium was replete .Magnesium was also low 1.5 was replete.Initially had fever but resolved. His B/p was also elevated started on amlodipine 5 mg tablet daily. Had renin and aldosterone was still pending on discharge.Nephrology referral recommended if abnormal.still pending during visit. States has been doing well since hospital discharge no seizure activity.    Past Medical History:  Diagnosis Date   Anxiety    Dysrhythmia    GERD (gastroesophageal reflux disease)    History  of cardioversion 05/04/2017   Hypertension    Paroxysmal atrial fibrillation [facility])    Past Surgical History:  Procedure Laterality Date   NO PAST SURGERIES     SHOULDER ARTHROSCOPY WITH LABRAL REPAIR Left 09/08/2021   Procedure: LEFT SHOULDER ARTHROSCOPIC LABRAL REPAIR;  Surgeon: Meredith Pel, MD;  Location: [place];  Service: Orthopedics;  Laterality: Left;    No Known Allergies  Outpatient Encounter Medications as of 02/17/2022  Medication Sig   aMILoride (MIDAMOR) 5 MG tablet Take 1 tablet (5 mg total) by mouth daily.   amLODipine (NORVASC) 10 MG tablet Take 0.5 tablets (5 mg total) by mouth daily.   escitalopram (LEXAPRO) 10 MG tablet Take 1 tablet (10 mg total) by mouth daily. APPOINTMENT OVERDUE   folic acid (FOLVITE) 1 MG tablet Take 1 mg by mouth daily.   ibuprofen (ADVIL) 800 MG tablet Take 1 tablet (800 mg total) by mouth every 8 (eight) hours as needed for moderate pain.   levETIRAcetam (KEPPRA) 750 MG tablet Take 1 tablet (750 mg total) by mouth 2 (two) times daily.   metoprolol succinate (TOPROL-XL) 50 MG 24 hr tablet Take 50 mg by mouth daily.   pantoprazole (PROTONIX) 40 MG tablet Take 40 mg by mouth daily.   potassium chloride SA (KLOR-CON M) 20 MEQ tablet To take 2 tablets today and 2 tablets tomorrow to replace potassium   No facility-administered encounter medications on file as of 02/17/2022.    Review of Systems  Constitutional:  Negative for appetite change, chills, fatigue, fever and unexpected weight change.  HENT:  Negative for congestion, ear discharge, ear pain, facial swelling, hearing loss, nosebleeds, postnasal drip, rhinorrhea, sinus pressure, sinus pain, sneezing, sore throat, tinnitus and trouble swallowing.  Eyes:  Negative for pain, discharge, redness, itching and visual disturbance.  Respiratory:  Negative for cough, chest tightness, shortness of breath and wheezing.   Cardiovascular:  Negative for chest pain, palpitations and leg swelling.   Gastrointestinal:  Negative for abdominal distention, abdominal pain, blood in stool, constipation, diarrhea, nausea and vomiting.  Endocrine: Negative for cold intolerance, heat intolerance, polydipsia, polyphagia and polyuria.  Genitourinary:  Negative for difficulty urinating, dysuria, flank pain, frequency and urgency.  Musculoskeletal:  Negative for arthralgias, back pain, gait problem, joint swelling, myalgias, neck pain and neck stiffness.       Numbness and tingling of right ring and pinky finger   Skin:  Negative for color change, pallor, rash and wound.  Neurological:  Negative for dizziness, syncope, speech difficulty, weakness, light-headedness, numbness and headaches.  Hematological:  Does not bruise/bleed easily.  Psychiatric/Behavioral:  Negative for agitation, behavioral problems, confusion, hallucinations and sleep disturbance. The patient is not nervous/anxious.     Immunization History  Administered Date(s) Administered   Influenza,inj,Quad PF,6+ Mos 12/28/2021   PFIZER(Purple Top)SARS-COV-2 Vaccination 04/30/2019, 05/22/2019, 01/31/2020   Tdap 10/10/2020   Pertinent  Health Maintenance Due  Topic Date Due   INFLUENZA VACCINE  Completed      02/06/2022    7:30 PM 02/07/2022    8:00 AM 02/07/2022    8:00 PM 02/08/2022    8:00 AM 02/17/2022    2:44 PM  [place] in the past year?     1  Was there an injury with Fall?     0  Fall Risk Category Calculator     1  Fall Risk Category     Low  Patient Fall Risk Level High fall risk Moderate fall risk High fall risk Moderate fall risk Low fall risk  Patient at Risk for Falls Due to     Impaired mobility;Impaired balance/gait  Fall risk Follow up     Falls evaluation completed;Education provided;Falls prevention discussed   Functional Status Survey:    Vitals:   02/17/22 1443  BP: 130/78  Pulse: 95  Resp: 16  Temp: 98 F (36.7 C)  SpO2: 98%  Weight: 191 lb 9.6 oz (86.9 kg)  Height: _0  (1.778 m)    Body mass index is 27.49 kg/m. Physical Exam Vitals reviewed.  Constitutional:      General: He is not in acute distress.    Appearance: Normal appearance. He is overweight. He is not ill-appearing or diaphoretic.  HENT:     Head: Normocephalic.     Right Ear: Tympanic membrane, ear canal and external ear normal. There is no impacted cerumen.     Left Ear: Tympanic membrane, ear canal and external ear normal. There is no impacted cerumen.     Nose: Nose normal. No congestion or rhinorrhea.     Mouth/Throat:     Mouth: Mucous membranes are moist.     Pharynx: Oropharynx is clear. No oropharyngeal exudate or posterior oropharyngeal erythema.  Eyes:     General: No scleral icterus.       Right eye: No discharge.        Left eye: No discharge.     Extraocular Movements: Extraocular movements intact.     Conjunctiva/sclera: Conjunctivae normal.     Pupils: Pupils are equal, round, and reactive to light.  Neck:     Vascular: No carotid bruit.  Cardiovascular:     Rate and Rhythm: Normal rate and regular rhythm.     Pulses:  Normal pulses.     Heart sounds: Normal heart sounds. No murmur heard.    No friction rub. No gallop.  Pulmonary:     Effort: Pulmonary effort is normal. No respiratory distress.     Breath sounds: Normal breath sounds. No wheezing, rhonchi or rales.  Chest:     Chest wall: No tenderness.  Abdominal:     General: Bowel sounds are normal. There is no distension.     Palpations: Abdomen is soft. There is no mass.     Tenderness: There is no abdominal tenderness. There is no right CVA tenderness, left CVA tenderness, guarding or rebound.  Musculoskeletal:        General: No swelling or tenderness. Normal range of motion.     Cervical back: Normal range of motion. No rigidity or tenderness.     Right lower leg: No edema.     Left lower leg: No edema.  Lymphadenopathy:     Cervical: No cervical adenopathy.  Skin:    General: Skin is warm and dry.      Coloration: Skin is not pale.     Findings: No bruising, erythema, lesion or rash.  Neurological:     Mental Status: He is alert and oriented to person, place, and time.     Cranial Nerves: No cranial nerve deficit.     Sensory: No sensory deficit.     Motor: No weakness.     Coordination: Coordination normal.     Gait: Gait normal.  Psychiatric:        Mood and Affect: Mood normal.        Speech: Speech normal.        Behavior: Behavior normal.        Thought Content: Thought content normal.        Judgment: Judgment normal.     Labs reviewed: Recent Labs    02/07/22 0418 02/07/22 1435 02/08/22 0218  NA 138 138 138  K 2.4* 3.1* 3.2*  CL 105 104 104  CO2 _0 GLUCOSE 95 101* 101*  BUN <5* <5* <5*  CREATININE 0.78 0.73 0.79  CALCIUM 7.8* 8.1* 8.0*  MG 1.7 1.5* 1.6*  PHOS 3.1  --   --    Recent Labs    02/06/22 0409 02/07/22 0418 02/08/22 0218  AST 162* 101* 86*  ALT 51* 42 37  ALKPHOS 105 88 83  BILITOT 1.2 1.2 0.8  PROT 6.5 6.0* 5.9*  ALBUMIN 3.4* 3.1* 3.0*   Recent Labs    01/06/22 1422 01/21/22 1513 02/05/22 1735  WBC 6.1 4.4 5.6  NEUTROABS 4.5 2.1 4.8  HGB 12.6* 14.9 12.4*  HCT 34.1* 39.8 33.1*  MCV 92.7 92.1 95.7  PLT 206 238 228   Lab Results  Component Value Date   TSH 3.850 02/06/2022   Lab Results  Component Value Date   HGBA1C 5.3 11/16/2021   Lab Results  Component Value Date   CHOL 274 (H) 12/31/2021   HDL 63 12/31/2021   LDLCALC  12/31/2021     Comment:     . LDL cholesterol not calculated. Triglyceride levels greater than 400 mg/dL invalidate calculated LDL results. . Reference range: <100 . Desirable range <100 mg/dL for primary prevention;   <70 mg/dL for patients with CHD or diabetic patients  with > or = 2 CHD risk factors. Marland Kitchen LDL-C is now calculated using the Martin-Hopkins  calculation, which is a validated novel method providing  better accuracy than the [employer]  equation in the  estimation of LDL-C.   Cresenciano Genre et al. Annamaria Helling. 8546;270(35): 2061-2068  (http://education.[facility].com/faq/FAQ164)    LDLDIRECT 22 11/16/2021   TRIG 877 (H) 12/31/2021   CHOLHDL 4.3 12/31/2021    Significant Diagnostic Results in last 30 days:  MR BRAIN WO CONTRAST  Result Date: 02/06/2022 CLINICAL DATA:  Seizure-like activity. EXAM: MRI HEAD WITHOUT CONTRAST TECHNIQUE: Multiplanar, multiecho pulse sequences of the brain and surrounding structures were obtained without intravenous contrast. COMPARISON:  CT head 1 day prior. FINDINGS: Brain: There is no acute intracranial hemorrhage, extra-axial fluid collection, or acute infarct Parenchymal volume is normal. The ventricles are normal in size. Gray-white differentiation is preserved. There is no structural or migration abnormality. The corpus callosum is normal. The pituitary and suprasellar region are normal. The pattern of myelination is normal. The hippocampi are normal in signal and architecture. There is no mass lesion there is no mass effect or midline shift. Vascular: Normal flow voids. Skull and upper cervical spine: Normal marrow signal. Sinuses/Orbits: The paranasal sinuses are clear. The globes and orbits are unremarkable. Other: None. IMPRESSION: Normal brain MRI Electronically Signed   By: Valetta Mole M.D.   On: 02/06/2022 18:52   EEG adult  Result Date: 02/06/2022 Derek Jack, MD     02/06/2022  3:41 PM Routine EEG Report Lyric Hoar is a 42 y.o. male with a history of seizure who is undergoing an EEG to evaluate for seizures. Report: This EEG was acquired with electrodes placed according to the International 10-20 electrode system (including Fp1, Fp2, F3, F4, C3, C4, P3, P4, O1, O2, T3, T4, T5, T6, A1, A2, Fz, Cz, Pz). The following electrodes were missing or displaced: none. The occipital dominant rhythm was 9 Hz. This activity is reactive to stimulation. Drowsiness was manifested by background fragmentation; deeper stages of sleep were  not identified. There was no focal slowing. There were no interictal epileptiform discharges. There were no electrographic seizures identified. Photic stimulation and hyperventilation were not performed. Impression: This EEG was obtained while awake and drowsy and is normal.   Clinical Correlation: Normal EEGs, however, do not rule out epilepsy. Su Monks, MD [facility] [phone] If 7pm- 7am, please page neurology on call as listed in [place].   DG Chest 2 View  Result Date: 02/06/2022 CLINICAL DATA:  Fever EXAM: CHEST - 2 VIEW COMPARISON:  Radiographs 10/21/2021 FINDINGS: Low lung volumes. The heart size and mediastinal contours are within normal limits. Both lungs are clear. The visualized skeletal structures are unremarkable. IMPRESSION: No active cardiopulmonary disease. Electronically Signed   By: Placido Sou M.D.   On: 02/06/2022 01:49   CT Head Wo Contrast  Result Date: 02/05/2022 CLINICAL DATA:  SEIZURE, ETOH ABUSE, FALL EXAM: CT HEAD WITHOUT CONTRAST TECHNIQUE: Contiguous axial images were obtained from the base of the skull through the vertex without intravenous contrast. RADIATION DOSE REDUCTION: This exam was performed according to the departmental dose-optimization program which includes automated exposure control, adjustment of the mA and/or kV according to patient size and/or use of iterative reconstruction technique. COMPARISON:  CT head 10/21/2021. FINDINGS: Brain: No evidence of acute infarction, hemorrhage, hydrocephalus, extra-axial collection or mass lesion/mass effect. Vascular: No hyperdense vessel identified. Skull: No acute fracture. Sinuses/Orbits: Largely clear sinuses.  No acute orbital findings. Other: No mastoid effusions. IMPRESSION: No evidence of acute intracranial abnormality. Electronically Signed   By: Margaretha Sheffield M.D.   On: 02/05/2022 18:51    Assessment/Plan 1. Essential hypertension Recently started on amlodipine 5  mg daily at the  hospital.  Blood pressure well-controlled. Continue on amlodipine and Metroprolol succinate - CMP with eGFR([facility])  2. Elevated liver enzymes Elevated during visit due to alcohol use. Alcohol cessation advised.  Will recheck liver enzymes. - CMP with eGFR([facility])  3. Seizure ([place]) Status post hospitalization as above due to witnessed seizure activity suspected due to alcohol use withdrawal.  Keppra dose adjusted during hospitalization we will recheck level. - Levetiracetam level  4. Hypokalemia K +2.6 Replete during hospitalization.Will recheck potassium level Continue with potassium supplements - CMP with eGFR([facility])  5. Hypocalcemia Calcium was 8.0 Will recheck level - CMP with eGFR([facility])  6. Hypomagnesuria Mag 1.5 in the hospital was replete.  Will recheck level - Magnesium  7. Numbness and tingling in right hand Reports numbness and tingling in right hand.Negative exam finding.  Will refer to orthopedic for further evaluation - Ambulatory referral to Orthopedic Surgery  Family/ staff Communication: Reviewed plan of care with patient verbalized understanding  Labs/tests ordered:   - CMP with eGFR([facility]) - Magnesium - Levetiracetam level  Next Appointment: Return in about 6 months (around 08/18/2022) for medical mangement of chronic issues with [place],NP.   Sandrea Hughs, NP

## 2022-02-19 ENCOUNTER — Telehealth: Payer: Self-pay | Admitting: Orthopedic Surgery

## 2022-02-19 NOTE — Telephone Encounter (Signed)
Called pt and was unable to leave a vm. Mailbox is full. Per Dr Marlou Sa pt need to reschedule. Dean not in office

## 2022-02-22 LAB — COMPLETE METABOLIC PANEL WITH GFR
AG Ratio: 1.4 (calc) (ref 1.0–2.5)
ALT: 31 U/L (ref 9–46)
AST: 35 U/L (ref 10–40)
Albumin: 4.2 g/dL (ref 3.6–5.1)
Alkaline phosphatase (APISO): 73 U/L (ref 36–130)
BUN/Creatinine Ratio: 7 (calc) (ref 6–22)
BUN: 6 mg/dL — ABNORMAL LOW (ref 7–25)
CO2: 27 mmol/L (ref 20–32)
Calcium: 8.9 mg/dL (ref 8.6–10.3)
Chloride: 105 mmol/L (ref 98–110)
Creat: 0.87 mg/dL (ref 0.60–1.26)
Globulin: 3 g/dL (calc) (ref 1.9–3.7)
Glucose, Bld: 78 mg/dL (ref 65–139)
Potassium: 3.6 mmol/L (ref 3.5–5.3)
Sodium: 141 mmol/L (ref 135–146)
Total Bilirubin: 0.3 mg/dL (ref 0.2–1.2)
Total Protein: 7.2 g/dL (ref 6.1–8.1)
eGFR: 113 mL/min/{1.73_m2} (ref >=60)

## 2022-02-22 LAB — LEVETIRACETAM LEVEL: Keppra (Levetiracetam): 13.2 ug/mL

## 2022-02-22 LAB — MAGNESIUM: Magnesium: 1.5 mg/dL (ref 1.5–2.5)

## 2022-03-08 ENCOUNTER — Other Ambulatory Visit: Payer: Self-pay | Admitting: Nurse Practitioner

## 2022-03-08 ENCOUNTER — Other Ambulatory Visit: Payer: Self-pay

## 2022-03-08 MED ORDER — PANTOPRAZOLE SODIUM 40 MG PO TBEC: 40.0000 mg | DELAYED_RELEASE_TABLET | Freq: Every day | ORAL | 1 refills | Status: DC

## 2022-03-10 ENCOUNTER — Ambulatory Visit (INDEPENDENT_AMBULATORY_CARE_PROVIDER_SITE_OTHER): Payer: 59 | Admitting: Family

## 2022-03-10 ENCOUNTER — Encounter: Payer: Self-pay | Admitting: Family

## 2022-03-10 VITALS — BP 142/98 | HR 116 | Temp 98.4°F | Resp 16 | Ht 70.0 in | Wt 185.6 lb

## 2022-03-10 DIAGNOSIS — F411 Generalized anxiety disorder: Secondary | ICD-10-CM

## 2022-03-10 DIAGNOSIS — R790 Abnormal level of blood mineral: Secondary | ICD-10-CM

## 2022-03-10 DIAGNOSIS — I10 Essential (primary) hypertension: Secondary | ICD-10-CM | POA: Diagnosis not present

## 2022-03-10 DIAGNOSIS — R569 Unspecified convulsions: Secondary | ICD-10-CM

## 2022-03-10 MED ORDER — ESCITALOPRAM OXALATE 10 MG PO TABS: 10.0000 mg | ORAL_TABLET | Freq: Every day | ORAL | 1 refills | Status: DC

## 2022-03-10 MED ORDER — MAGNESIUM 400 MG PO TABS: 400.0000 mg | ORAL_TABLET | Freq: Every day | ORAL | 0 refills | Status: DC

## 2022-03-10 MED ORDER — LEVETIRACETAM 750 MG PO TABS: 750.0000 mg | ORAL_TABLET | Freq: Two times a day (BID) | ORAL | 1 refills | Status: DC

## 2022-03-10 MED ORDER — AMLODIPINE BESYLATE 10 MG PO TABS: 10.0000 mg | ORAL_TABLET | Freq: Every day | ORAL | 1 refills | Status: DC

## 2022-03-10 NOTE — Patient Instructions (Signed)
-  check Blood pressure at home and record on log provided and notify provider if B/p > 140/90  ? ?

## 2022-03-10 NOTE — Progress Notes (Signed)
Provider: Callaway Hailes FNP-C  Sharon Seller, NP  Patient Care Team: Sharon Seller, NP as PCP - General (Geriatric Medicine)  Extended Emergency Contact Information Primary Emergency Contact: Raylene Everts [place] Home Phone: [phone] Relation: Spouse Secondary Emergency Contact: [facility] Phone: [phone] Relation: Mother Preferred language: English Interpreter needed? No  Code Status:  Full Code  Goals of care: Advanced Directive information    03/10/2022    9:51 AM  Advanced Directives  Does Patient Have a Medical Advance Directive? No  Would patient like information on creating a medical advance directive? No - Patient declined     Chief Complaint  Patient presents with   Acute Visit    Patient complains of elevated blood pressure.    Labs    Discuss recent labs.     HPI:  Pt is a 42 y.o. male seen today for an acute visit for evaluation of elevated blood pressure.states was at work felt dizziness.Nurse checked B/p was in the 160's.Has blood pressure cuff at home but does not usually check.  States has been out of the metoprolol for about 3-4 days.   Recent lab results reviewed and discussed today.lab results within normal range.Mg 1.6 and K+ 3.6 stats had stopped taking potassium.  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   Also request a letter to his apartment to be allowed to have a service dog to detect his seizure since he lives alone. Reports no recent seizure activity.    Request medication refilled amlodipine,Lexapro and Keppra.   Past Medical History:  Diagnosis Date   Anxiety    Dysrhythmia    GERD (gastroesophageal reflux disease)    History of cardioversion 05/04/2017   Hypertension    Paroxysmal atrial fibrillation [facility])    Past Surgical History:  Procedure Laterality Date   NO  PAST SURGERIES     SHOULDER ARTHROSCOPY WITH LABRAL REPAIR Left 09/08/2021   Procedure: LEFT SHOULDER ARTHROSCOPIC LABRAL REPAIR;  Surgeon: Cammy Copa, MD;  Location: [facility] OR;  Service: Orthopedics;  Laterality: Left;    No Known Allergies  Outpatient Encounter Medications as of 03/10/2022  Medication Sig   aMILoride (MIDAMOR) 5 MG tablet Take 1 tablet (5 mg total) by mouth daily.   amLODipine (NORVASC) 10 MG tablet Take 0.5 tablets (5 mg total) by mouth daily.   escitalopram (LEXAPRO) 10 MG tablet Take 1 tablet (10 mg total) by mouth daily. APPOINTMENT OVERDUE   folic acid (FOLVITE) 1 MG tablet Take 1 mg by mouth daily.   ibuprofen (ADVIL) 800 MG tablet Take 1 tablet (800 mg total) by mouth every 8 (eight) hours as needed for moderate pain.   levETIRAcetam (KEPPRA) 750 MG tablet Take 1 tablet (750 mg total) by mouth 2 (two) times daily.   metoprolol succinate (TOPROL-XL) 50 MG 24 hr tablet TAKE 1 TABLET BY MOUTH EVERY DAY   pantoprazole (PROTONIX) 40 MG tablet Take 1 tablet (40 mg total) by mouth daily.   potassium chloride SA (KLOR-CON M) 20 MEQ tablet To take 2 tablets today and 2 tablets tomorrow to replace potassium   No facility-administered encounter medications on file as of 03/10/2022.    Review of Systems  Constitutional:  Negative for appetite change, chills, fatigue, fever and unexpected weight change.  HENT:  Negative for congestion, nosebleeds, postnasal drip, rhinorrhea, sinus pressure, sinus pain, sneezing,  sore throat and tinnitus.   Eyes:  Negative for pain, discharge, redness, itching and visual disturbance.  Respiratory:  Negative for cough, chest tightness, shortness of breath and wheezing.   Cardiovascular:  Negative for chest pain, palpitations and leg swelling.  Gastrointestinal:  Negative for abdominal distention, abdominal pain, nausea and vomiting.  Endocrine: Negative for cold intolerance, heat intolerance, polydipsia, polyphagia and polyuria.   Musculoskeletal:  Negative for arthralgias, back pain, gait problem, joint swelling, myalgias, neck pain and neck stiffness.  Skin:  Negative for color change, pallor and rash.  Neurological:  Negative for dizziness, syncope, speech difficulty, weakness, light-headedness and headaches.       Tingling sensation on right hand ring and 5 th digit states was given some exercise to do by Employee provider at work.   Hematological:  Does not bruise/bleed easily.  Psychiatric/Behavioral:  Negative for agitation, behavioral problems, confusion, hallucinations and sleep disturbance. The patient is not nervous/anxious.     Immunization History  Administered Date(s) Administered   Influenza,inj,Quad PF,6+ Mos 12/28/2021   PFIZER(Purple Top)SARS-COV-2 Vaccination 04/30/2019, 05/22/2019, 01/31/2020   Tdap 10/10/2020   Pertinent  Health Maintenance Due  Topic Date Due   INFLUENZA VACCINE  Completed      02/07/2022    8:00 AM 02/07/2022    8:00 PM 02/08/2022    8:00 AM 02/17/2022    2:44 PM 03/10/2022    9:51 AM  Fall Risk  Falls in the past year?    1 0  Was there an injury with Fall?    0 0  Fall Risk Category Calculator    1 0  Fall Risk Category (Retired)    Low   (RETIRED) Patient Fall Risk Level Moderate fall risk High fall risk Moderate fall risk Low fall risk   Patient at Risk for Falls Due to    Impaired mobility;Impaired balance/gait No Fall Risks  Fall risk Follow up    Falls evaluation completed;Education provided;Falls prevention discussed Falls evaluation completed   Functional Status Survey:    Vitals:   03/10/22 0950  BP: (!) 142/98  Pulse: (!) 116  Resp: 16  Temp: 98.4 F (36.9 C)  SpO2: 98%  Weight: 185 lb 9.6 oz (84.2 kg)  Height: 5\' 10"  (1.778 m)   Body mass index is 26.63 kg/m. Physical Exam Vitals reviewed.  Constitutional:      General: He is not in acute distress.    Appearance: Normal appearance. He is overweight. He is not ill-appearing or diaphoretic.   HENT:     Head: Normocephalic.  Eyes:     General: No scleral icterus.       Right eye: No discharge.        Left eye: No discharge.     Conjunctiva/sclera: Conjunctivae normal.     Pupils: Pupils are equal, round, and reactive to light.  Neck:     Vascular: No carotid bruit.  Cardiovascular:     Rate and Rhythm: Normal rate and regular rhythm.     Pulses: Normal pulses.     Heart sounds: Normal heart sounds. No murmur heard.    No friction rub. No gallop.  Pulmonary:     Effort: Pulmonary effort is normal. No respiratory distress.     Breath sounds: Normal breath sounds. No wheezing, rhonchi or rales.  Chest:     Chest wall: No tenderness.  Abdominal:     General: Bowel sounds are normal. There is no distension.     Palpations: Abdomen is  soft. There is no mass.     Tenderness: There is no abdominal tenderness. There is no right CVA tenderness, left CVA tenderness, guarding or rebound.  Musculoskeletal:        General: No swelling or tenderness. Normal range of motion.     Cervical back: Normal range of motion. No tenderness.     Right lower leg: No edema.     Left lower leg: No edema.  Lymphadenopathy:     Cervical: No cervical adenopathy.  Skin:    General: Skin is warm and dry.     Coloration: Skin is not pale.     Findings: No erythema or rash.  Neurological:     Mental Status: He is alert and oriented to person, place, and time.     Cranial Nerves: No cranial nerve deficit.     Motor: No weakness.     Gait: Gait normal.  Psychiatric:        Mood and Affect: Mood normal.        Speech: Speech normal.        Behavior: Behavior normal.     Labs reviewed: Recent Labs    02/07/22 0418 02/07/22 1435 02/08/22 0218 02/17/22 1546  NA 138 138 138 141  K 2.4* 3.1* 3.2* 3.6  CL 105 104 104 105  CO2 24 24 26 27   GLUCOSE 95 101* 101* 78  BUN <5* <5* <5* 6*  CREATININE 0.78 0.73 0.79 0.87  CALCIUM 7.8* 8.1* 8.0* 8.9  MG 1.7 1.5* 1.6* 1.5  PHOS 3.1  --   --   --     Recent Labs    02/06/22 0409 02/07/22 0418 02/08/22 0218 02/17/22 1546  AST 162* 101* 86* 35  ALT 51* 42 37 31  ALKPHOS 105 88 83  --   BILITOT 1.2 1.2 0.8 0.3  PROT 6.5 6.0* 5.9* 7.2  ALBUMIN 3.4* 3.1* 3.0*  --    Recent Labs    01/06/22 1422 01/21/22 1513 02/05/22 1735  WBC 6.1 4.4 5.6  NEUTROABS 4.5 2.1 4.8  HGB 12.6* 14.9 12.4*  HCT 34.1* 39.8 33.1*  MCV 92.7 92.1 95.7  PLT 206 238 228   Lab Results  Component Value Date   TSH 3.850 02/06/2022   Lab Results  Component Value Date   HGBA1C 5.3 11/16/2021   Lab Results  Component Value Date   CHOL 274 (H) 12/31/2021   HDL 63 12/31/2021   [place]  12/31/2021     Comment:     . LDL cholesterol not calculated. Triglyceride levels greater than 400 mg/dL invalidate calculated LDL results. . Reference range: <100 . Desirable range <100 mg/dL for primary prevention;   <70 mg/dL for patients with CHD or diabetic patients  with > or = 2 CHD risk factors. Marland Kitchen LDL-C is now calculated using the Martin-Hopkins  calculation, which is a validated novel method providing  better accuracy than the Friedewald equation in the  estimation of LDL-C.  Cresenciano Genre et al. Annamaria Helling. 9476;546(50): 2061-2068  (http://education.[employer].com/faq/FAQ164)    LDLDIRECT 22 11/16/2021   TRIG 877 (H) 12/31/2021   CHOLHDL 4.3 12/31/2021    Significant Diagnostic Results in last 30 days:  No results found.  Assessment/Plan  1. GAD (generalized anxiety disorder) Stable Request lexapro refilled.  - escitalopram (LEXAPRO) 10 MG tablet; Take 1 tablet (10 mg total) by mouth daily. APPOINTMENT OVERDUE  Dispense: 90 tablet; Refill: 1  2. Essential hypertension B/p elevated at work and here today.Asymptomatic today.Had been  out of metoprolol for 3-4 days.Took metoprolol today but B/p still not at goal. - advised to increase amlodipine from 5 mg to 10 mg tablet daily. - Advised to check Blood pressure at home and record on log  provided and notify provider if B/p > 140/90  - amLODipine (NORVASC) 10 MG tablet; Take 1 tablet (10 mg total) by mouth daily.  Dispense: 90 tablet; Refill: 1  3. Seizure ([place]) No recent seizure activity  Letter written to be allowed in his apartment to have a service dog to detect seizure activity since he lives by himself. 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.   4. Low magnesium level Mg 1.5  Take magnesium supplement.  - Magnesium 400 MG TABS; Take 400 mg by mouth daily.  Dispense: 90 tablet; Refill: 0  Family/ staff Communication: Reviewed plan of care with patient verbalized understanding  Labs/tests ordered: None   Next Appointment: Return if symptoms worsen or fail to improve.   Sandrea Hughs, NP

## 2022-03-18 ENCOUNTER — Ambulatory Visit: Payer: 59 | Admitting: Orthopedic Surgery

## 2022-04-08 ENCOUNTER — Emergency Department (HOSPITAL_COMMUNITY): Payer: 59

## 2022-04-08 ENCOUNTER — Other Ambulatory Visit: Payer: Self-pay

## 2022-04-08 ENCOUNTER — Inpatient Hospital Stay (HOSPITAL_COMMUNITY)
Admission: EM | Admit: 2022-04-08 | Discharge: 2022-04-11 | DRG: 897 | Disposition: A | Discharge status: Home / Self Care | Payer: 59 | Attending: Internal Medicine | Admitting: Internal Medicine

## 2022-04-08 ENCOUNTER — Encounter (HOSPITAL_COMMUNITY): Payer: Self-pay

## 2022-04-08 DIAGNOSIS — E872 Acidosis, unspecified: Secondary | ICD-10-CM | POA: Diagnosis present

## 2022-04-08 DIAGNOSIS — F10939 Alcohol use, unspecified with withdrawal, unspecified: Secondary | ICD-10-CM | POA: Diagnosis present

## 2022-04-08 DIAGNOSIS — R569 Unspecified convulsions: Secondary | ICD-10-CM | POA: Diagnosis not present

## 2022-04-08 DIAGNOSIS — M25312 Other instability, left shoulder: Secondary | ICD-10-CM | POA: Diagnosis present

## 2022-04-08 DIAGNOSIS — E876 Hypokalemia: Secondary | ICD-10-CM | POA: Diagnosis present

## 2022-04-08 DIAGNOSIS — E785 Hyperlipidemia, unspecified: Secondary | ICD-10-CM | POA: Diagnosis present

## 2022-04-08 DIAGNOSIS — E860 Dehydration: Secondary | ICD-10-CM | POA: Diagnosis present

## 2022-04-08 DIAGNOSIS — F1020 Alcohol dependence, uncomplicated: Secondary | ICD-10-CM | POA: Diagnosis present

## 2022-04-08 DIAGNOSIS — I480 Paroxysmal atrial fibrillation: Secondary | ICD-10-CM | POA: Diagnosis present

## 2022-04-08 DIAGNOSIS — K219 Gastro-esophageal reflux disease without esophagitis: Secondary | ICD-10-CM | POA: Diagnosis present

## 2022-04-08 DIAGNOSIS — I4891 Unspecified atrial fibrillation: Secondary | ICD-10-CM | POA: Diagnosis not present

## 2022-04-08 DIAGNOSIS — I10 Essential (primary) hypertension: Secondary | ICD-10-CM | POA: Diagnosis present

## 2022-04-08 DIAGNOSIS — Z79899 Other long term (current) drug therapy: Secondary | ICD-10-CM

## 2022-04-08 DIAGNOSIS — E871 Hypo-osmolality and hyponatremia: Secondary | ICD-10-CM | POA: Diagnosis present

## 2022-04-08 DIAGNOSIS — F419 Anxiety disorder, unspecified: Secondary | ICD-10-CM | POA: Diagnosis present

## 2022-04-08 DIAGNOSIS — F10239 Alcohol dependence with withdrawal, unspecified: Secondary | ICD-10-CM | POA: Diagnosis present

## 2022-04-08 DIAGNOSIS — G40909 Epilepsy, unspecified, not intractable, without status epilepticus: Secondary | ICD-10-CM | POA: Diagnosis present

## 2022-04-08 LAB — CBC WITH DIFFERENTIAL/PLATELET
Abs Immature Granulocytes: 0.04 10*3/uL (ref 0.00–0.07)
Basophils Absolute: 0 10*3/uL (ref 0.0–0.1)
Basophils Relative: 1 %
Eosinophils Absolute: 0.2 10*3/uL (ref 0.0–0.5)
Eosinophils Relative: 2 %
HCT: 31 % — ABNORMAL LOW (ref 39.0–52.0)
Hemoglobin: 11 g/dL — ABNORMAL LOW (ref 13.0–17.0)
Immature Granulocytes: 1 %
Lymphocytes Relative: 12 %
Lymphs Abs: 0.8 10*3/uL (ref 0.7–4.0)
MCH: 34.1 pg — ABNORMAL HIGH (ref 26.0–34.0)
MCHC: 35.5 g/dL (ref 30.0–36.0)
MCV: 96 fL (ref 80.0–100.0)
Monocytes Absolute: 0.6 10*3/uL (ref 0.1–1.0)
Monocytes Relative: 9 %
Neutro Abs: 4.8 10*3/uL (ref 1.7–7.7)
Neutrophils Relative %: 75 %
Platelets: 301 10*3/uL (ref 150–400)
RBC: 3.23 MIL/uL — ABNORMAL LOW (ref 4.22–5.81)
RDW: 14 % (ref 11.5–15.5)
WBC: 6.3 10*3/uL (ref 4.0–10.5)
nRBC: 0 % (ref 0.0–0.2)

## 2022-04-08 LAB — COMPREHENSIVE METABOLIC PANEL
ALT: 27 U/L (ref 0–44)
AST: 189 U/L — ABNORMAL HIGH (ref 15–41)
Albumin: 3.4 g/dL — ABNORMAL LOW (ref 3.5–5.0)
Alkaline Phosphatase: 98 U/L (ref 38–126)
Anion gap: 24 — ABNORMAL HIGH (ref 5–15)
BUN: <5 mg/dL — ABNORMAL LOW (ref 6–20)
CO2: 13 mmol/L — ABNORMAL LOW (ref 22–32)
Calcium: 6.8 mg/dL — ABNORMAL LOW (ref 8.9–10.3)
Chloride: 94 mmol/L — ABNORMAL LOW (ref 98–111)
Creatinine, Ser: 0.99 mg/dL (ref 0.61–1.24)
GFR, Estimated: >60 mL/min (ref >60)
Glucose, Bld: 196 mg/dL — ABNORMAL HIGH (ref 70–99)
Potassium: 2.5 mmol/L — CL (ref 3.5–5.1)
Sodium: 131 mmol/L — ABNORMAL LOW (ref 135–145)
Total Bilirubin: 0.9 mg/dL (ref 0.3–1.2)
Total Protein: 6.7 g/dL (ref 6.5–8.1)

## 2022-04-08 LAB — LACTIC ACID, PLASMA
Lactic Acid, Venous: 1.9 mmol/L (ref 0.5–1.9)
Lactic Acid, Venous: 2 mmol/L (ref 0.5–1.9)

## 2022-04-08 LAB — BASIC METABOLIC PANEL
Anion gap: 13 (ref 5–15)
BUN: <5 mg/dL — ABNORMAL LOW (ref 6–20)
CO2: 22 mmol/L (ref 22–32)
Calcium: 7 mg/dL — ABNORMAL LOW (ref 8.9–10.3)
Chloride: 99 mmol/L (ref 98–111)
Creatinine, Ser: 0.83 mg/dL (ref 0.61–1.24)
GFR, Estimated: >60 mL/min (ref >60)
Glucose, Bld: 123 mg/dL — ABNORMAL HIGH (ref 70–99)
Potassium: 2.6 mmol/L — CL (ref 3.5–5.1)
Sodium: 134 mmol/L — ABNORMAL LOW (ref 135–145)

## 2022-04-08 LAB — BLOOD GAS, VENOUS
Acid-Base Excess: 3.2 mmol/L — ABNORMAL HIGH (ref 0.0–2.0)
Bicarbonate: 27.9 mmol/L (ref 20.0–28.0)
O2 Saturation: 38 %
Patient temperature: 37.1
pCO2, Ven: 42 mmHg — ABNORMAL LOW (ref 44–60)
pH, Ven: 7.43 (ref 7.25–7.43)
pO2, Ven: <31 mmHg — CL (ref 32–45)

## 2022-04-08 LAB — LIPASE, BLOOD: Lipase: 88 U/L — ABNORMAL HIGH (ref 11–51)

## 2022-04-08 LAB — TROPONIN I (HIGH SENSITIVITY)
Troponin I (High Sensitivity): 9 ng/L (ref <18)
Troponin I (High Sensitivity): 9 ng/L (ref <18)

## 2022-04-08 LAB — PHOSPHORUS: Phosphorus: 1.6 mg/dL — ABNORMAL LOW (ref 2.5–4.6)

## 2022-04-08 LAB — MAGNESIUM
Magnesium: 0.5 mg/dL — CL (ref 1.7–2.4)
Magnesium: 1.3 mg/dL — ABNORMAL LOW (ref 1.7–2.4)

## 2022-04-08 LAB — AMYLASE: Amylase: 133 U/L — ABNORMAL HIGH (ref 28–100)

## 2022-04-08 MED ORDER — LORAZEPAM 2 MG/ML IJ SOLN: 0.0000 mg | Freq: Two times a day (BID) | INTRAMUSCULAR | Status: DC

## 2022-04-08 MED ORDER — LORAZEPAM 1 MG PO TABS: 0.0000 mg | ORAL_TABLET | Freq: Two times a day (BID) | ORAL | Status: DC

## 2022-04-08 MED ORDER — ESCITALOPRAM OXALATE 10 MG PO TABS
10.0000 mg | ORAL_TABLET | Freq: Every day | ORAL | Status: DC
  Administered 2022-04-09 – 2022-04-11 (×3): 10 mg via ORAL
  Filled 2022-04-08 (×3): qty 1

## 2022-04-08 MED ORDER — SODIUM CHLORIDE 0.9 % IV SOLN: INTRAVENOUS | Status: DC

## 2022-04-08 MED ORDER — LORAZEPAM 2 MG/ML IJ SOLN
2.0000 mg | Freq: Once | INTRAMUSCULAR | Status: AC
  Administered 2022-04-08: 2 mg via INTRAVENOUS
  Filled 2022-04-08: qty 1

## 2022-04-08 MED ORDER — POTASSIUM CHLORIDE IN NACL 40-0.9 MEQ/L-% IV SOLN
INTRAVENOUS | Status: DC
  Filled 2022-04-08 (×2): qty 1000

## 2022-04-08 MED ORDER — METOPROLOL TARTRATE 5 MG/5ML IV SOLN
2.5000 mg | Freq: Once | INTRAVENOUS | Status: AC
  Administered 2022-04-08: 2.5 mg via INTRAVENOUS
  Filled 2022-04-08: qty 5

## 2022-04-08 MED ORDER — MAGNESIUM SULFATE 2 GM/50ML IV SOLN
2.0000 g | Freq: Once | INTRAVENOUS | Status: AC
  Administered 2022-04-08: 2 g via INTRAVENOUS
  Filled 2022-04-08: qty 50

## 2022-04-08 MED ORDER — METOPROLOL TARTRATE 5 MG/5ML IV SOLN: 2.5000 mg | Freq: Four times a day (QID) | INTRAVENOUS | Status: DC | PRN

## 2022-04-08 MED ORDER — METOPROLOL TARTRATE 25 MG PO TABS
25.0000 mg | ORAL_TABLET | Freq: Two times a day (BID) | ORAL | Status: DC
  Administered 2022-04-08 – 2022-04-11 (×7): 25 mg via ORAL
  Filled 2022-04-08 (×7): qty 1

## 2022-04-08 MED ORDER — ASPIRIN 81 MG PO TBEC
81.0000 mg | DELAYED_RELEASE_TABLET | Freq: Every day | ORAL | Status: DC
  Administered 2022-04-08 – 2022-04-11 (×4): 81 mg via ORAL
  Filled 2022-04-08 (×4): qty 1

## 2022-04-08 MED ORDER — POTASSIUM & SODIUM PHOSPHATES 280-160-250 MG PO PACK
2.0000 | PACK | Freq: Four times a day (QID) | ORAL | Status: AC
  Administered 2022-04-08 – 2022-04-09 (×2): 2 via ORAL
  Filled 2022-04-08 (×2): qty 2

## 2022-04-08 MED ORDER — PANTOPRAZOLE SODIUM 40 MG IV SOLR
40.0000 mg | Freq: Once | INTRAVENOUS | Status: AC
  Administered 2022-04-08: 40 mg via INTRAVENOUS
  Filled 2022-04-08: qty 10

## 2022-04-08 MED ORDER — MAGNESIUM 400 MG PO TABS: 400.0000 mg | ORAL_TABLET | Freq: Every day | ORAL | Status: DC

## 2022-04-08 MED ORDER — ENOXAPARIN SODIUM 40 MG/0.4ML IJ SOSY
40.0000 mg | PREFILLED_SYRINGE | INTRAMUSCULAR | Status: DC
  Administered 2022-04-08 – 2022-04-09 (×2): 40 mg via SUBCUTANEOUS
  Filled 2022-04-08 (×2): qty 0.4

## 2022-04-08 MED ORDER — POTASSIUM CHLORIDE 10 MEQ/100ML IV SOLN
10.0000 meq | INTRAVENOUS | Status: AC
  Administered 2022-04-08 (×4): 10 meq via INTRAVENOUS
  Filled 2022-04-08 (×3): qty 100

## 2022-04-08 MED ORDER — IBUPROFEN 200 MG PO TABS: 800.0000 mg | ORAL_TABLET | Freq: Three times a day (TID) | ORAL | Status: DC | PRN

## 2022-04-08 MED ORDER — ACETAMINOPHEN 650 MG RE SUPP: 650.0000 mg | Freq: Four times a day (QID) | RECTAL | Status: DC | PRN

## 2022-04-08 MED ORDER — FOLIC ACID 1 MG PO TABS
1.0000 mg | ORAL_TABLET | Freq: Every day | ORAL | Status: DC
  Administered 2022-04-08 – 2022-04-11 (×4): 1 mg via ORAL
  Filled 2022-04-08 (×4): qty 1

## 2022-04-08 MED ORDER — LEVETIRACETAM 750 MG PO TABS
750.0000 mg | ORAL_TABLET | Freq: Two times a day (BID) | ORAL | Status: DC
  Administered 2022-04-08 – 2022-04-11 (×6): 750 mg via ORAL
  Filled 2022-04-08 (×6): qty 1

## 2022-04-08 MED ORDER — SODIUM CHLORIDE 0.9 % IV BOLUS
1000.0000 mL | Freq: Once | INTRAVENOUS | Status: AC
  Administered 2022-04-08: 1000 mL via INTRAVENOUS

## 2022-04-08 MED ORDER — ONDANSETRON HCL 4 MG/2ML IJ SOLN: 4.0000 mg | Freq: Four times a day (QID) | INTRAMUSCULAR | Status: DC | PRN

## 2022-04-08 MED ORDER — CALCIUM GLUCONATE-NACL 1-0.675 GM/50ML-% IV SOLN
1.0000 g | Freq: Once | INTRAVENOUS | Status: AC
  Administered 2022-04-08: 1000 mg via INTRAVENOUS
  Filled 2022-04-08: qty 50

## 2022-04-08 MED ORDER — THIAMINE MONONITRATE 100 MG PO TABS
100.0000 mg | ORAL_TABLET | Freq: Every day | ORAL | Status: DC
  Administered 2022-04-08 – 2022-04-11 (×4): 100 mg via ORAL
  Filled 2022-04-08 (×4): qty 1

## 2022-04-08 MED ORDER — ONDANSETRON HCL 4 MG PO TABS: 4.0000 mg | ORAL_TABLET | Freq: Four times a day (QID) | ORAL | Status: DC | PRN

## 2022-04-08 MED ORDER — LORAZEPAM 1 MG PO TABS
0.0000 mg | ORAL_TABLET | Freq: Four times a day (QID) | ORAL | Status: AC
  Administered 2022-04-08: 2 mg via ORAL
  Administered 2022-04-09 (×2): 1 mg via ORAL
  Filled 2022-04-08: qty 2
  Filled 2022-04-08: qty 1
  Filled 2022-04-08: qty 2

## 2022-04-08 MED ORDER — LABETALOL HCL 5 MG/ML IV SOLN: 20.0000 mg | INTRAVENOUS | Status: DC | PRN

## 2022-04-08 MED ORDER — POTASSIUM CHLORIDE CRYS ER 20 MEQ PO TBCR
40.0000 meq | EXTENDED_RELEASE_TABLET | Freq: Once | ORAL | Status: AC
  Administered 2022-04-08: 40 meq via ORAL
  Filled 2022-04-08: qty 2

## 2022-04-08 MED ORDER — PANTOPRAZOLE SODIUM 40 MG PO TBEC
40.0000 mg | DELAYED_RELEASE_TABLET | Freq: Every day | ORAL | Status: DC
  Administered 2022-04-08 – 2022-04-11 (×4): 40 mg via ORAL
  Filled 2022-04-08 (×4): qty 1

## 2022-04-08 MED ORDER — MAGNESIUM OXIDE -MG SUPPLEMENT 400 (240 MG) MG PO TABS
400.0000 mg | ORAL_TABLET | Freq: Every day | ORAL | Status: DC
  Administered 2022-04-08: 400 mg via ORAL
  Filled 2022-04-08 (×2): qty 1

## 2022-04-08 MED ORDER — POTASSIUM CHLORIDE CRYS ER 20 MEQ PO TBCR
40.0000 meq | EXTENDED_RELEASE_TABLET | Freq: Every day | ORAL | Status: DC
  Administered 2022-04-08: 40 meq via ORAL
  Filled 2022-04-08 (×2): qty 2

## 2022-04-08 MED ORDER — LORAZEPAM 2 MG/ML IJ SOLN
0.0000 mg | Freq: Four times a day (QID) | INTRAMUSCULAR | Status: AC
  Administered 2022-04-08: 1 mg via INTRAVENOUS
  Administered 2022-04-08: 3 mg via INTRAVENOUS
  Filled 2022-04-08: qty 1
  Filled 2022-04-08: qty 2

## 2022-04-08 MED ORDER — ACETAMINOPHEN 325 MG PO TABS: 650.0000 mg | ORAL_TABLET | Freq: Four times a day (QID) | ORAL | Status: DC | PRN

## 2022-04-08 MED ORDER — THIAMINE HCL 100 MG/ML IJ SOLN
100.0000 mg | Freq: Every day | INTRAMUSCULAR | Status: DC
  Filled 2022-04-08: qty 2

## 2022-04-08 NOTE — ED Notes (Signed)
ED TO INPATIENT HANDOFF REPORT  ED Nurse Name and Phone #: [phone]  S Name/Age/Gender Devon Hanson 42 y.o. male Room/Bed: 030C/030C  Code Status   Code Status: Full Code  Home/SNF/Other Home Patient oriented to: self, place, time, and situation Is this baseline? Yes   Triage Complete: Triage complete  Chief Complaint Alcohol withdrawal ([place]) [F10.939]  Triage Note Pt to the ed from home with a CC of seizure x 5 min 20 mins ago. Pt was picked up by ems and found with a heart rate in the 200s. Pt is nauseous with vomiting. Pt denies hitting head/ head pain. Pt received Cardizem 20, 25 and zofran 4 with 600 NS en route. Pt relays he stopped drinking 2 days ago and will typically have a pint a day. Pt is actively shaking and uncomfortable.      Allergies No Known Allergies  Level of Care/Admitting Diagnosis ED Disposition     ED Disposition  Admit   Condition  --   Comment  Hospital Area: [place] [place] [[id number]]  Level of Care: Telemetry Medical [104]  May admit patient to [facility] or [facility] if equivalent level of care is available:: No  Covid Evaluation: Asymptomatic - no recent exposure (last 10 days) testing not required  Diagnosis: Alcohol withdrawal ([place]) [291.81.ICD-9-CM]  Admitting Physician: Lequita Halt [id number]  Attending Physician: Lequita Halt [id number]  Certification:: I certify this patient will need inpatient services for at least 2 midnights  Estimated Length of Stay: 2          B Medical/Surgery History Past Medical History:  Diagnosis Date   Anxiety    Dysrhythmia    GERD (gastroesophageal reflux disease)    History of cardioversion 05/04/2017   Hypertension    Paroxysmal atrial fibrillation [facility])    Past Surgical History:  Procedure Laterality Date   NO PAST SURGERIES     SHOULDER ARTHROSCOPY WITH LABRAL REPAIR Left 09/08/2021   Procedure: LEFT SHOULDER ARTHROSCOPIC LABRAL REPAIR;  Surgeon: Meredith Pel,  MD;  Location: [place];  Service: Orthopedics;  Laterality: Left;     A IV Location/Drains/Wounds Patient Lines/Drains/Airways Status     Active Line/Drains/Airways     Name Placement date Placement time Site Days   Peripheral IV 04/08/22 Anterior;Distal;Right Forearm 04/08/22  0745  Forearm  less than 1            Intake/Output Last 24 hours No intake or output data in the 24 hours ending 04/08/22 1350  Labs/Imaging Results for orders placed or performed during the hospital encounter of 04/08/22 (from the past 48 hour(s))  Comprehensive metabolic panel     Status: Abnormal   Collection Time: 04/08/22  8:49 AM  Result Value Ref Range   Sodium 131 (L) 135 - 145 mmol/L   Potassium 2.5 (LL) 3.5 - 5.1 mmol/L    Comment: CRITICAL RESULT CALLED TO, READ BACK BY AND VERIFIED WITH: J.Deryl Ports RN @1026$  02.22.2024 E.AHMED    Chloride 94 (L) 98 - 111 mmol/L   CO2 13 (L) 22 - 32 mmol/L   Glucose, Bld 196 (H) 70 - 99 mg/dL    Comment: Glucose reference range applies only to samples taken after fasting for at least 8 hours.   BUN <5 (L) 6 - 20 mg/dL   Creatinine, Ser 0.99 0.61 - 1.24 mg/dL   Calcium 6.8 (L) 8.9 - 10.3 mg/dL   Total Protein 6.7 6.5 - 8.1 g/dL   Albumin 3.4 (L) 3.5 -  5.0 g/dL   AST 189 (H) 15 - 41 U/L   ALT 27 0 - 44 U/L   Alkaline Phosphatase 98 38 - 126 U/L   Total Bilirubin 0.9 0.3 - 1.2 mg/dL   GFR, Estimated >60 >60 mL/min    Comment: (NOTE) Calculated using the CKD-EPI Creatinine Equation (2021)    Anion gap 24 (H) 5 - 15    Comment: ELECTROLYTES REPEATED TO VERIFY Performed at [facility] [address], [place], [place] [address]   Lipase, blood     Status: Abnormal   Collection Time: 04/08/22  8:49 AM  Result Value Ref Range   Lipase 88 (H) 11 - 51 U/L    Comment: Performed at [facility] [address] [place], [place] [address]  Troponin I (High Sensitivity)     Status: None   Collection Time: 04/08/22  8:49 AM  Result Value Ref  Range   Troponin I (High Sensitivity) 9 <18 ng/L    Comment: (NOTE) Elevated high sensitivity troponin I (hsTnI) values and significant  changes across serial measurements may suggest ACS but many other  chronic and acute conditions are known to elevate hsTnI results.  Refer to the Links section for chest pain algorithms and additional  guidance. Performed at [facility], [place] [address], [place], [place] [address]   CBC with Differential     Status: Abnormal   Collection Time: 04/08/22  8:49 AM  Result Value Ref Range   WBC 6.3 4.0 - 10.5 K/uL   RBC 3.23 (L) 4.22 - 5.81 MIL/uL   Hemoglobin 11.0 (L) 13.0 - 17.0 g/dL   HCT 31.0 (L) 39.0 - 52.0 %   MCV 96.0 80.0 - 100.0 fL   MCH 34.1 (H) 26.0 - 34.0 pg   MCHC 35.5 30.0 - 36.0 g/dL   RDW 14.0 11.5 - 15.5 %   Platelets 301 150 - 400 K/uL   nRBC 0.0 0.0 - 0.2 %   Neutrophils Relative % 75 %   Neutro Abs 4.8 1.7 - 7.7 K/uL   Lymphocytes Relative 12 %   Lymphs Abs 0.8 0.7 - 4.0 K/uL   Monocytes Relative 9 %   Monocytes Absolute 0.6 0.1 - 1.0 K/uL   Eosinophils Relative 2 %   Eosinophils Absolute 0.2 0.0 - 0.5 K/uL   Basophils Relative 1 %   Basophils Absolute 0.0 0.0 - 0.1 K/uL   Immature Granulocytes 1 %   Abs Immature Granulocytes 0.04 0.00 - 0.07 K/uL    Comment: Performed at [facility] [address], [place], [place] [address]  Magnesium     Status: Abnormal   Collection Time: 04/08/22  8:49 AM  Result Value Ref Range   Magnesium 0.5 (LL) 1.7 - 2.4 mg/dL    Comment: CRITICAL RESULT CALLED TO, READ BACK BY AND VERIFIED WITH  J.Lucile Didonato RN@1026$  02.22.2024 E.AHMED Performed at [facility], [place] [address], [place], [place] [address]   Troponin I (High Sensitivity)     Status: None   Collection Time: 04/08/22 10:49 AM  Result Value Ref Range   Troponin I (High Sensitivity) 9 <18 ng/L    Comment: (NOTE) Elevated high sensitivity troponin I (hsTnI) values and significant  changes across serial  measurements may suggest ACS but many other  chronic and acute conditions are known to elevate hsTnI results.  Refer to the "Links" section for chest pain algorithms and additional  guidance. Performed at [facility], [place] [address] [place], [place] [address]    DG Chest Port 1 View  Result Date: 04/08/2022 CLINICAL DATA:  sob EXAM: PORTABLE CHEST 1 VIEW COMPARISON:  Chest x-ray 02/06/2022. FINDINGS: The heart size and mediastinal contours are within normal limits. Both lungs are clear. No visible pleural effusions or pneumothorax. No acute osseous abnormality. IMPRESSION: No active disease. Electronically Signed   By: Margaretha Sheffield M.D.   On: 04/08/2022 09:20    Pending Labs Unresulted Labs (From admission, onward)     Start     Ordered   04/09/22 0500  Lipase, blood  Tomorrow morning,   R        04/08/22 1227   04/09/22 [id number]  Basic metabolic panel  Tomorrow morning,   R        04/08/22 1228   04/08/22 [id number]  Basic metabolic panel  Once-Timed,   STAT        04/08/22 1228   04/08/22 1800  Magnesium  Once-Timed,   STAT        04/08/22 1228   04/08/22 1259  Lactic acid, plasma  STAT Now then every 3 hours,   R (with STAT occurrences)      04/08/22 1258   04/08/22 1244  Blood gas, venous  Once,   R        04/08/22 1243   04/08/22 1242  Levetiracetam level  Add-on,   AD        04/08/22 1241   04/08/22 1228  Amylase  Add-on,   AD        04/08/22 1227   04/08/22 1228  Phosphorus  Add-on,   AD        04/08/22 1227            Vitals/Pain Today's Vitals   04/08/22 1145 04/08/22 1200 04/08/22 1215 04/08/22 1334  BP: 119/78 (!) 129/95 (!) 120/104 (!) 140/80  Pulse: (!) 105 (!) 112 (!) 106 (!) 103  Resp: (!) 22 (!) 26 20   Temp:      TempSrc:      SpO2: 99% 100% 100%   Weight:      Height:      PainSc:        Isolation Precautions No active isolations  Medications Medications  LORazepam (ATIVAN) injection 0-4 mg (3 mg Intravenous Given 04/08/22 0930)    Or   LORazepam (ATIVAN) tablet 0-4 mg ( Oral See Alternative 04/08/22 0930)  LORazepam (ATIVAN) injection 0-4 mg (has no administration in time range)    Or  LORazepam (ATIVAN) tablet 0-4 mg (has no administration in time range)  thiamine (VITAMIN B1) tablet 100 mg (100 mg Oral Given 04/08/22 0921)    Or  thiamine (VITAMIN B1) injection 100 mg ( Intravenous See Alternative 04/08/22 0921)  potassium chloride 10 mEq in 100 mL IVPB (10 mEq Intravenous New Bag/Given 04/08/22 1332)  ibuprofen (ADVIL) tablet 800 mg (has no administration in time range)  metoprolol tartrate (LOPRESSOR) tablet 25 mg (25 mg Oral Given 04/08/22 1334)  escitalopram (LEXAPRO) tablet 10 mg (has no administration in time range)  pantoprazole (PROTONIX) EC tablet 40 mg (has no administration in time range)  folic acid (FOLVITE) tablet 1 mg (has no administration in time range)  levETIRAcetam (KEPPRA) tablet 750 mg (has no administration in time range)  Magnesium TABS 400 mg (has no administration in time range)  potassium chloride SA (KLOR-CON M) CR tablet 40 mEq (has no administration in time range)  enoxaparin (LOVENOX) injection 40 mg (40 mg Subcutaneous Given 04/08/22 1334)  aspirin EC tablet 81 mg (81 mg Oral Given 04/08/22 1334)  ondansetron (ZOFRAN) tablet 4 mg (has no administration in time range)    Or  ondansetron (ZOFRAN) injection 4 mg (has no administration in time range)  acetaminophen (TYLENOL) tablet 650 mg (has no administration in time range)    Or  acetaminophen (TYLENOL) suppository 650 mg (has no administration in time range)  metoprolol tartrate (LOPRESSOR) injection 2.5 mg (has no administration in time range)  0.9 %  sodium chloride infusion (has no administration in time range)  calcium gluconate 1 g/ 50 mL sodium chloride IVPB (1,000 mg Intravenous New Bag/Given 04/08/22 1333)  labetalol (NORMODYNE) injection 20 mg (has no administration in time range)  sodium chloride 0.9 % bolus 1,000 mL (0 mLs  Intravenous Stopped 04/08/22 1117)  LORazepam (ATIVAN) injection 2 mg (2 mg Intravenous Given 04/08/22 0858)  pantoprazole (PROTONIX) injection 40 mg (40 mg Intravenous Given 04/08/22 1009)  metoprolol tartrate (LOPRESSOR) injection 2.5 mg (2.5 mg Intravenous Given 04/08/22 1044)  magnesium sulfate IVPB 2 g 50 mL (0 g Intravenous Stopped 04/08/22 1210)  sodium chloride 0.9 % bolus 1,000 mL (1,000 mLs Intravenous New Bag/Given 04/08/22 1117)    Mobility walks     Focused Assessments Cardiac Assessment Handoff:  Cardiac Rhythm: Atrial fibrillation Lab Results  Component Value Date   CKTOTAL 1,512 (H) 02/06/2022   TROPONINI 0.03 (HH) 09/26/2016   No results found for: "DDIMER" Does the Patient currently have chest pain? No   , Neuro Assessment Handoff:  Swallow screen pass? Yes  Cardiac Rhythm: Atrial fibrillation       Neuro Assessment: Exceptions to [facility] Neuro Checks:      Has TPA been given? No If patient is a Neuro Trauma and patient is going to OR before floor call report to [place] nurse: [phone] or [phone]   R Recommendations: See Admitting Provider Note  Report given to:   Additional Notes:

## 2022-04-08 NOTE — Progress Notes (Addendum)
Repeat K=2.6, after total of 40 mEq IV KCL given.  Fluid nurses giving 40 mEq oral KCl now, will order another 40 mEq KCl in 2 hours and change IV fluid to NS with 40 mEq KCl at 125 MLS per hour.  Phosphorus= 1.5, 2 dose of Neutra-Phos ordered.  Repeat Mg=1.3, one more 2 gm IV Mg ordered

## 2022-04-08 NOTE — H&P (Signed)
History and Physical    Devon Hanson [id number] DOB: 1980-04-13 DOA: 04/08/2022  PCP: Lauree Chandler, NP (Confirm with patient/family/NH records and if not entered, this has to be entered at [facility] point of entry) Patient coming from: Home  I have personally briefly reviewed patient's old medical records in [place]  Chief Complaint: Feeling better  HPI: Devon Hanson is a 42 y.o. male with medical history significant of alcohol abuse, seizure disorder, PAF status post cardioversion, HTN, anxiety/depression, brought in by EMS for alcohol withdrawal and seizure.  Patient drinks hard liquor about 1 pint every day, last drink was Tuesday evening.  Patient woke up once today and started to feel severe nauseous and vomited multiple times yesterday of stomach contents down bile nonbloody, no abdominal pain no diarrhea no fever chills.  Became very dehydrated in the evening and unable to eat or drink whole day yesterday.  This morning, patient was found seizing, tonic-clonic in his bed about 5 minutes, no tongue biting no loss control of urine or bowel movement.  Patient claims that he has been compliant with Keppra, last dose was yesterday evening.  Family called EMS.  EMS arrived and found patient to be in rapid A-fib with heart rate in 200s, and patient was given normal saline, IV Ativan and IV push of Cardizem. ED Course: Patient was found to be in rapid A-fib [place] after 1 dose of IV metoprolol.  Blood work showed significant depletion of magnesium<0.5, potassium 2.5.  Mentation much improved after IV fluid and Zofran and Ativan on arrival.  CIWA protocol started and patient was given the first dose of IV Ativan and K and Mg replacement  Review of Systems: As per HPI otherwise 14 point review of systems negative.    Past Medical History:  Diagnosis Date   Anxiety    Dysrhythmia    GERD (gastroesophageal reflux disease)    History of cardioversion 05/04/2017    Hypertension    Paroxysmal atrial fibrillation [facility])     Past Surgical History:  Procedure Laterality Date   NO PAST SURGERIES     SHOULDER ARTHROSCOPY WITH LABRAL REPAIR Left 09/08/2021   Procedure: LEFT SHOULDER ARTHROSCOPIC LABRAL REPAIR;  Surgeon: Meredith Pel, MD;  Location: [place];  Service: Orthopedics;  Laterality: Left;     reports that he has never smoked. He has never used smokeless tobacco. He reports current alcohol use of about 1.0 standard drink of alcohol per week. He reports that he does not use drugs.  No Known Allergies  History reviewed. No pertinent family history.   Prior to Admission medications   Medication Sig Start Date End Date Taking? Authorizing Provider  aMILoride (MIDAMOR) 5 MG tablet Take 1 tablet (5 mg total) by mouth daily. 02/09/22   August Albino, MD  amLODipine (NORVASC) 10 MG tablet Take 1 tablet (10 mg total) by mouth daily. 03/10/22   Ngetich, Dinah C, NP  escitalopram (LEXAPRO) 10 MG tablet Take 1 tablet (10 mg total) by mouth daily. APPOINTMENT OVERDUE 03/10/22   Ngetich, Dinah C, NP  folic acid (FOLVITE) 1 MG tablet Take 1 mg by mouth daily. 02/09/22   [provider]  ibuprofen (ADVIL) 800 MG tablet Take 1 tablet (800 mg total) by mouth every 8 (eight) hours as needed for moderate pain. 05/29/21   Leath-Warren, Alda Lea, NP  levETIRAcetam (KEPPRA) 750 MG tablet Take 1 tablet (750 mg total) by mouth 2 (two) times daily. 03/10/22   Ngetich, Nelda Bucks, NP  Magnesium 400 MG TABS Take 400 mg by mouth daily. 03/10/22 04/09/22  Ngetich, Dinah C, NP  magnesium oxide (MAG-OX) 400 (240 Mg) MG tablet Take 400 mg by mouth daily. 03/10/22   [provider]  metoprolol succinate (TOPROL-XL) 50 MG 24 hr tablet TAKE 1 TABLET BY MOUTH EVERY DAY 03/08/22   Lauree Chandler, NP  pantoprazole (PROTONIX) 40 MG tablet Take 1 tablet (40 mg total) by mouth daily. 03/08/22   Lauree Chandler, NP  potassium chloride SA (KLOR-CON M) 20 MEQ tablet To  take 2 tablets today and 2 tablets tomorrow to replace potassium 02/08/22   August Albino, MD    Physical Exam: Vitals:   04/08/22 1130 04/08/22 1145 04/08/22 1200 04/08/22 1215  BP: (!) 114/96 119/78 (!) 129/95 (!) 120/104  Pulse: (!) 108 (!) 105 (!) 112 (!) 106  Resp: (!) 22 (!) 22 (!) 26 20  Temp:      TempSrc:      SpO2: 100% 99% 100% 100%  Weight:      Height:        Constitutional: NAD, calm, comfortable Vitals:   04/08/22 1130 04/08/22 1145 04/08/22 1200 04/08/22 1215  BP: (!) 114/96 119/78 (!) 129/95 (!) 120/104  Pulse: (!) 108 (!) 105 (!) 112 (!) 106  Resp: (!) 22 (!) 22 (!) 26 20  Temp:      TempSrc:      SpO2: 100% 99% 100% 100%  Weight:      Height:       Eyes: PERRL, lids and conjunctivae normal ENMT: Mucous membranes are moist. Posterior pharynx clear of any exudate or lesions.Normal dentition.  Neck: normal, supple, no masses, no thyromegaly Respiratory: clear to auscultation bilaterally, no wheezing, no crackles. Normal respiratory effort. No accessory muscle use.  Cardiovascular: Regular rate and rhythm, no murmurs / rubs / gallops. No extremity edema. 2+ pedal pulses. No carotid bruits.  Abdomen: no tenderness, no masses palpated. No hepatosplenomegaly. Bowel sounds positive.  Musculoskeletal: no clubbing / cyanosis. No joint deformity upper and lower extremities. Good ROM, no contractures. Normal muscle tone.  Skin: no rashes, lesions, ulcers. No induration Neurologic: CN 2-12 grossly intact. Sensation intact, DTR normal. Strength 5/5 in all 4.  Psychiatric: Normal judgment and insight. Alert and oriented x 3. Normal mood. Fine tremors on B/L forearm and fingertips  (  Labs on Admission: I have personally reviewed following labs and imaging studies  CBC: Recent Labs  Lab 04/08/22 0849  WBC 6.3  NEUTROABS 4.8  HGB 11.0*  HCT 31.0*  MCV 96.0  PLT [id number]   Basic Metabolic Panel: Recent Labs  Lab 04/08/22 0849  NA 131*  K 2.5*  CL 94*  CO2 13*   GLUCOSE 196*  BUN <5*  CREATININE 0.99  CALCIUM 6.8*  MG 0.5*   GFR: Estimated Creatinine Clearance: 96.9 mL/min (by C-G formula based on SCr of 0.99 mg/dL). Liver Function Tests: Recent Labs  Lab 04/08/22 0849  AST 189*  ALT 27  ALKPHOS 98  BILITOT 0.9  PROT 6.7  ALBUMIN 3.4*   Recent Labs  Lab 04/08/22 0849  LIPASE 88*   No results for input(s): "AMMONIA" in the last 168 hours. Coagulation Profile: No results for input(s): "INR", "PROTIME" in the last 168 hours. Cardiac Enzymes: No results for input(s): "CKTOTAL", "CKMB", "CKMBINDEX", "TROPONINI" in the last 168 hours. BNP (last 3 results) No results for input(s): "PROBNP" in the last 8760 hours. HbA1C: No results for input(s): "HGBA1C" in the last  72 hours. CBG: No results for input(s): "GLUCAP" in the last 168 hours. Lipid Profile: No results for input(s): "CHOL", "HDL", "LDLCALC", "TRIG", "CHOLHDL", "LDLDIRECT" in the last 72 hours. Thyroid Function Tests: No results for input(s): "TSH", "T4TOTAL", "FREET4", "T3FREE", "THYROIDAB" in the last 72 hours. Anemia Panel: No results for input(s): "VITAMINB12", "FOLATE", "FERRITIN", "TIBC", "IRON", "RETICCTPCT" in the last 72 hours. Urine analysis:    Component Value Date/Time   COLORURINE STRAW (A) 02/05/2022 1938   APPEARANCEUR CLEAR 02/05/2022 1938   LABSPEC 1.003 (L) 02/05/2022 1938   PHURINE 6.0 02/05/2022 1938   GLUCOSEU NEGATIVE 02/05/2022 1938   HGBUR MODERATE (A) 02/05/2022 1938   BILIRUBINUR NEGATIVE 02/05/2022 [place] NEGATIVE 02/05/2022 1938   PROTEINUR 30 (A) 02/05/2022 1938   NITRITE NEGATIVE 02/05/2022 1938   LEUKOCYTESUR NEGATIVE 02/05/2022 1938    Radiological Exams on Admission: DG Chest Port 1 View  Result Date: 04/08/2022 CLINICAL DATA:  sob EXAM: PORTABLE CHEST 1 VIEW COMPARISON:  Chest x-ray 02/06/2022. FINDINGS: The heart size and mediastinal contours are within normal limits. Both lungs are clear. No visible pleural effusions  or pneumothorax. No acute osseous abnormality. IMPRESSION: No active disease. Electronically Signed   By: Margaretha Sheffield M.D.   On: 04/08/2022 09:20    EKG: Independently reviewed.  Sinus tachycardia, no acute ST changes.  Assessment/Plan Principal Problem:   Alcohol withdrawal (HCC) Active Problems:   Seizure (HCC)   Shoulder joint instability, left   Alcohol use disorder, severe, in controlled environment [facility])   Seizure due to alcohol withdrawal [facility])   Essential hypertension  (please populate well all problems here in Problem List. (For example, if patient is on BP meds at home and you resume or decide to hold them, it is a problem that needs to be her. Same for CAD, COPD, HLD and so on)  Alcohol withdrawal with withdrawal seizure -Still having significant alcohol withdrawal symptoms -Continue CIWA protocol with rhonchal benzos -Correct electrolytes. -Admitted to telemetry for close monitoring -Consult case management as patient is interested in outpatient detox program -Other DDx, check Keppra level, clinically low suspicion for breakthrough seizure.  PAF with RVR -Resume likely secondary to alcohol withdrawal and profound electrolytes depletion including hypokalemia, hypocalcemia and hypomagnesemia -Continue home dose of metoprolol -Add ASA 81  Severe hypokalemia -Secondary to repeated vomiting and metabolic acidosis -IV and p.o. replacement -Recheck level this evening  Severe hypomagnesemia -Secondary to alcohol abuse, IV replacement -Recheck level this evening  Hypocalcemia -Likely secondary to alcohol abuse -IV replacement, recheck level tomorrow -Send phosphorus level  High anion gap metabolic acidosis -Clinically suspect lactic acidosis, check lactic level -Etiology likely related to volume depletion from repeated GI loss -No cough no x-ray shadows on chest x-ray, no diarrhea, no leukocytosis, low suspicion for sepsis. -Continue fluid resuscitation with  maintenance normal saline at 150 MLS per hour  Elevated lipase -Denies any abdominal pain, abdomen physical exam benign, low suspicion for acute pancreatitis -Etiology might related to repeated nauseous vomiting, will check amylase level.    DVT prophylaxis: Lovenox Code Status: Full code Family Communication: None at bedside Disposition Plan: Expect more than 2 midnight hospital stay to tread alcohol-based for and monitor seizure and electrolyte imbalance and A-fib. Consults called: None Admission status: Tele admit   Lequita Halt MD [facility] Pager [phone]  04/08/2022, 12:45 PM

## 2022-04-08 NOTE — ED Provider Notes (Signed)
[facility] Provider Note   CSN: [id number] Arrival date & time: 04/08/22  [id number]     History  Chief Complaint  Patient presents with   Seizures    Devon Hanson is a 42 y.o. male.  He has a history of seizure disorder paroxysmal A-fib and alcohol abuse.  He said he cut back on drinking since Tuesday, was drinking a pint today.  Yesterday was very shaky.  Today EMS was called to house after his wife found him seizing.  Reportedly had a 5-minute tonic-clonic seizure. he was nauseous and had vomiting.  Received Cardizem and Zofran.  He said he has a history of A-fib and takes metoprolol, not on any blood thinners.  He said he has been taking his medications including his Keppra.  He is not sure when his last seizure was.  He denies any chest pain shortness of breath abdominal pain.  The history is provided by the patient and the EMS personnel.  Seizures Seizure activity on arrival: no   Seizure type:  Grand mal Return to baseline: yes   Duration:  5 minutes Timing:  Once Progression:  Resolved Context: alcohol withdrawal   History of seizures: yes        Home Medications Prior to Admission medications   Medication Sig Start Date End Date Taking? Authorizing Provider  aMILoride (MIDAMOR) 5 MG tablet Take 1 tablet (5 mg total) by mouth daily. 02/09/22   August Albino, MD  amLODipine (NORVASC) 10 MG tablet Take 1 tablet (10 mg total) by mouth daily. 03/10/22   Ngetich, Dinah C, NP  escitalopram (LEXAPRO) 10 MG tablet Take 1 tablet (10 mg total) by mouth daily. APPOINTMENT OVERDUE 03/10/22   Ngetich, Dinah C, NP  folic acid (FOLVITE) 1 MG tablet Take 1 mg by mouth daily. 02/09/22   [provider]  ibuprofen (ADVIL) 800 MG tablet Take 1 tablet (800 mg total) by mouth every 8 (eight) hours as needed for moderate pain. 05/29/21   Leath-Warren, Alda Lea, NP  levETIRAcetam (KEPPRA) 750 MG tablet Take 1 tablet (750 mg total) by mouth 2  (two) times daily. 03/10/22   Ngetich, Dinah C, NP  Magnesium 400 MG TABS Take 400 mg by mouth daily. 03/10/22 04/09/22  Ngetich, Dinah C, NP  metoprolol succinate (TOPROL-XL) 50 MG 24 hr tablet TAKE 1 TABLET BY MOUTH EVERY DAY 03/08/22   Lauree Chandler, NP  pantoprazole (PROTONIX) 40 MG tablet Take 1 tablet (40 mg total) by mouth daily. 03/08/22   Lauree Chandler, NP  potassium chloride SA (KLOR-CON M) 20 MEQ tablet To take 2 tablets today and 2 tablets tomorrow to replace potassium 02/08/22   August Albino, MD      Allergies    Patient has no known allergies.    Review of Systems   Review of Systems  Constitutional:  Negative for fever.  HENT:  Negative for sore throat.   Eyes:  Negative for visual disturbance.  Respiratory:  Negative for shortness of breath.   Cardiovascular:  Negative for chest pain.  Gastrointestinal:  Positive for nausea and vomiting. Negative for abdominal pain.  Genitourinary:  Negative for dysuria.  Skin:  Negative for rash.  Neurological:  Positive for tremors and seizures.    Physical Exam Updated Vital Signs BP 129/86 (BP Location: Left Arm)   Pulse 91   Temp 98.7 F (37.1 C) (Oral)   Resp 17   Ht 5' 8"$  (1.727 m)  Wt 79 kg   SpO2 100%   BMI 26.48 kg/m  Physical Exam Vitals and nursing note reviewed.  Constitutional:      General: He is not in acute distress.    Appearance: Normal appearance. He is well-developed.  HENT:     Head: Normocephalic and atraumatic.  Eyes:     Conjunctiva/sclera: Conjunctivae normal.  Cardiovascular:     Rate and Rhythm: Tachycardia present. Rhythm irregular.     Heart sounds: No murmur heard. Pulmonary:     Effort: Pulmonary effort is normal. No respiratory distress.     Breath sounds: Normal breath sounds.  Abdominal:     Palpations: Abdomen is soft.     Tenderness: There is no abdominal tenderness.  Musculoskeletal:        General: No swelling.     Cervical back: Neck supple.  Skin:    General: Skin  is warm and dry.     Capillary Refill: Capillary refill takes less than 2 seconds.  Neurological:     General: No focal deficit present.     Mental Status: He is alert.     Sensory: No sensory deficit.     Motor: No weakness.     Comments: Patient having diffuse tremor     ED Results / Procedures / Treatments   Labs (all labs ordered are listed, but only abnormal results are displayed) Labs Reviewed  COMPREHENSIVE METABOLIC PANEL - Abnormal; Notable for the following components:      Result Value   Sodium 131 (*)    Potassium 2.5 (*)    Chloride 94 (*)    CO2 13 (*)    Glucose, Bld 196 (*)    BUN <5 (*)    Calcium 6.8 (*)    Albumin 3.4 (*)    AST 189 (*)    Anion gap 24 (*)    All other components within normal limits  LIPASE, BLOOD - Abnormal; Notable for the following components:   Lipase 88 (*)    All other components within normal limits  CBC WITH DIFFERENTIAL/PLATELET - Abnormal; Notable for the following components:   RBC 3.23 (*)    Hemoglobin 11.0 (*)    HCT 31.0 (*)    MCH 34.1 (*)    All other components within normal limits  MAGNESIUM - Abnormal; Notable for the following components:   Magnesium 0.5 (*)    All other components within normal limits  AMYLASE - Abnormal; Notable for the following components:   Amylase 133 (*)    All other components within normal limits  PHOSPHORUS - Abnormal; Notable for the following components:   Phosphorus 1.6 (*)    All other components within normal limits  LACTIC ACID, PLASMA  BASIC METABOLIC PANEL  MAGNESIUM  LACTIC ACID, PLASMA  BLOOD GAS, VENOUS  LEVETIRACETAM LEVEL  TROPONIN I (HIGH SENSITIVITY)  TROPONIN I (HIGH SENSITIVITY)    EKG EKG Interpretation  Date/Time:  Thursday April 08 2022 11:12:00 EST Ventricular Rate:  104 PR Interval:  158 QRS Duration: 83 QT Interval:  380 QTC Calculation: 500 R Axis:   20 Text Interpretation: Sinus tachycardia Probable anteroseptal infarct, old Nonspecific T  abnormalities, inferior leads improved rate and return to sinus from prior today Confirmed by Aletta Edouard [phone]) on 04/08/2022 11:21:11 AM  Radiology DG Chest Port 1 View  Result Date: 04/08/2022 CLINICAL DATA:  sob EXAM: PORTABLE CHEST 1 VIEW COMPARISON:  Chest x-ray 02/06/2022. FINDINGS: The heart size and mediastinal contours are within  normal limits. Both lungs are clear. No visible pleural effusions or pneumothorax. No acute osseous abnormality. IMPRESSION: No active disease. Electronically Signed   By: Margaretha Sheffield M.D.   On: 04/08/2022 09:20    Procedures .Critical Care  Performed by: Hayden Rasmussen, MD Authorized by: Hayden Rasmussen, MD   Critical care provider statement:    Critical care time (minutes):  45   Critical care time was exclusive of:  Separately billable procedures and treating other patients   Critical care was necessary to treat or prevent imminent or life-threatening deterioration of the following conditions:  Circulatory failure, cardiac failure, metabolic crisis and CNS failure or compromise   Critical care was time spent personally by me on the following activities:  Development of treatment plan with patient or surrogate, discussions with consultants, evaluation of patient's response to treatment, examination of patient, obtaining history from patient or surrogate, ordering and performing treatments and interventions, ordering and review of laboratory studies, ordering and review of radiographic studies, pulse oximetry, re-evaluation of patient's condition and review of old charts   I assumed direction of critical care for this patient from another provider in my specialty: no       Medications Ordered in ED Medications  LORazepam (ATIVAN) injection 0-4 mg (1 mg Intravenous Given 04/08/22 1439)    Or  LORazepam (ATIVAN) tablet 0-4 mg ( Oral See Alternative 04/08/22 1439)  LORazepam (ATIVAN) injection 0-4 mg (has no administration in time range)    Or   LORazepam (ATIVAN) tablet 0-4 mg (has no administration in time range)  thiamine (VITAMIN B1) tablet 100 mg (100 mg Oral Given 04/08/22 0921)    Or  thiamine (VITAMIN B1) injection 100 mg ( Intravenous See Alternative 04/08/22 0921)  ibuprofen (ADVIL) tablet 800 mg (has no administration in time range)  metoprolol tartrate (LOPRESSOR) tablet 25 mg (25 mg Oral Given 04/08/22 1334)  escitalopram (LEXAPRO) tablet 10 mg (has no administration in time range)  pantoprazole (PROTONIX) EC tablet 40 mg (has no administration in time range)  folic acid (FOLVITE) tablet 1 mg (has no administration in time range)  levETIRAcetam (KEPPRA) tablet 750 mg (has no administration in time range)  Magnesium TABS 400 mg (has no administration in time range)  potassium chloride SA (KLOR-CON M) CR tablet 40 mEq (has no administration in time range)  enoxaparin (LOVENOX) injection 40 mg (40 mg Subcutaneous Given 04/08/22 1334)  aspirin EC tablet 81 mg (81 mg Oral Given 04/08/22 1334)  ondansetron (ZOFRAN) tablet 4 mg (has no administration in time range)    Or  ondansetron (ZOFRAN) injection 4 mg (has no administration in time range)  acetaminophen (TYLENOL) tablet 650 mg (has no administration in time range)    Or  acetaminophen (TYLENOL) suppository 650 mg (has no administration in time range)  metoprolol tartrate (LOPRESSOR) injection 2.5 mg (has no administration in time range)  0.9 %  sodium chloride infusion ( Intravenous New Bag/Given 04/08/22 1427)  labetalol (NORMODYNE) injection 20 mg (has no administration in time range)  sodium chloride 0.9 % bolus 1,000 mL (0 mLs Intravenous Stopped 04/08/22 1117)  LORazepam (ATIVAN) injection 2 mg (2 mg Intravenous Given 04/08/22 0858)  pantoprazole (PROTONIX) injection 40 mg (40 mg Intravenous Given 04/08/22 1009)  metoprolol tartrate (LOPRESSOR) injection 2.5 mg (2.5 mg Intravenous Given 04/08/22 1044)  magnesium sulfate IVPB 2 g 50 mL (0 g Intravenous Stopped 04/08/22  1210)  potassium chloride 10 mEq in 100 mL IVPB (0 mEq Intravenous Stopped 04/08/22  1620)  sodium chloride 0.9 % bolus 1,000 mL (0 mLs Intravenous Stopping previously hung infusion 04/08/22 1620)  calcium gluconate 1 g/ 50 mL sodium chloride IVPB (0 mg Intravenous Stopped 04/08/22 1438)    ED Course/ Medical Decision Making/ A&P Clinical Course as of 04/08/22 1645  Thu Apr 08, 2022  [id number] Chest x-ray interpreted by me as no acute infiltrates.  Awaiting radiology reading. [MB]  1101 While patient was getting the metoprolol he broke into normal sinus rhythm.  Currently heart rate in the 90s.  Looks more comfortable. [MB]  1223 Patient looking better now.  Still with significant electrolyte derangements with low potassium low magnesium.  I think his withdrawal symptoms are fairly well-controlled with the CIWA.  Discussed with Dr. Roosevelt Locks [facility] who will evaluate patient for admission. [MB]    Clinical Course User Index [MB] Hayden Rasmussen, MD                             Medical Decision Making Amount and/or Complexity of Data Reviewed Labs: ordered. Radiology: ordered.  Risk OTC drugs. Prescription drug management. Decision regarding hospitalization.   This patient complains of seizure, alcohol withdrawal,; this involves an extensive number of treatment Options and is a complaint that carries with it a high risk of complications and morbidity. The differential includes alcohol withdrawal seizure, breakthrough seizure, A-fib, arrhythmia, dehydration, ACS, metabolic derangement, dehydration  I ordered, reviewed and interpreted labs, which included CBC with normal white count and hemoglobin lower than priors, chemistries with significant low potassium low magnesium low bicarb elevated glucose, troponins flat, lactate not elevated I ordered medication IV Ativan IV magnesium and potassium IV fluids and reviewed PMP when indicated. I ordered imaging studies which included chest  x-ray and I independently    visualized and interpreted imaging which showed no acute findings Additional history obtained from EMS and patient's family members Previous records obtained and reviewed in epic, prior history of alcohol withdrawal I consulted [facility] hospital[facility] Dr. Roosevelt Locks and discussed lab and imaging findings and discussed disposition.  Cardiac monitoring reviewed, A-fib with RVR transitioning to the normal sinus rhythm Social determinants considered, ongoing alcohol abuse food insecurity transportation issues Critical Interventions: Initiation of IV benzos for seizure prophylaxis and alcohol withdrawal, repletion of metabolic derangement  After the interventions stated above, I reevaluated the patient and found patient is converted to normal sinus rhythm and improving in his mental status and vital signs Admission and further testing considered, he would benefit from mission the hospital for continued management of his metabolic derangement and alcohol withdrawal         Final Clinical Impression(s) / ED Diagnoses Final diagnoses:  Seizure ([place])  Alcohol withdrawal syndrome with complication ([place])  Atrial fibrillation with rapid ventricular response ([place])  Hypokalemia  Hypomagnesemia    Rx / DC Orders ED Discharge Orders     None         Hayden Rasmussen, MD 04/08/22 1649

## 2022-04-08 NOTE — ED Triage Notes (Signed)
Pt to the ed from home with a CC of seizure x 5 min 20 mins ago. Pt was picked up by ems and found with a heart rate in the 200s. Pt is nauseous with vomiting. Pt denies hitting head/ head pain. Pt received Cardizem 20, 25 and zofran 4 with 600 NS en route. Pt relays he stopped drinking 2 days ago and will typically have a pint a day. Pt is actively shaking and uncomfortable.

## 2022-04-09 DIAGNOSIS — I4891 Unspecified atrial fibrillation: Secondary | ICD-10-CM

## 2022-04-09 DIAGNOSIS — R569 Unspecified convulsions: Secondary | ICD-10-CM

## 2022-04-09 DIAGNOSIS — F10939 Alcohol use, unspecified with withdrawal, unspecified: Secondary | ICD-10-CM | POA: Diagnosis not present

## 2022-04-09 LAB — LIPASE, BLOOD: Lipase: 111 U/L — ABNORMAL HIGH (ref 11–51)

## 2022-04-09 LAB — BASIC METABOLIC PANEL
Anion gap: 9 (ref 5–15)
BUN: <5 mg/dL — ABNORMAL LOW (ref 6–20)
CO2: 22 mmol/L (ref 22–32)
Calcium: 7 mg/dL — ABNORMAL LOW (ref 8.9–10.3)
Chloride: 108 mmol/L (ref 98–111)
Creatinine, Ser: 0.65 mg/dL (ref 0.61–1.24)
GFR, Estimated: >60 mL/min (ref >60)
Glucose, Bld: 90 mg/dL (ref 70–99)
Potassium: 3.1 mmol/L — ABNORMAL LOW (ref 3.5–5.1)
Sodium: 139 mmol/L (ref 135–145)

## 2022-04-09 LAB — PHOSPHORUS: Phosphorus: 2 mg/dL — ABNORMAL LOW (ref 2.5–4.6)

## 2022-04-09 LAB — LEVETIRACETAM LEVEL: Levetiracetam Lvl: 2.3 ug/mL — ABNORMAL LOW (ref 10.0–40.0)

## 2022-04-09 LAB — MAGNESIUM: Magnesium: 1.4 mg/dL — ABNORMAL LOW (ref 1.7–2.4)

## 2022-04-09 MED ORDER — CALCIUM GLUCONATE-NACL 2-0.675 GM/100ML-% IV SOLN
2.0000 g | Freq: Once | INTRAVENOUS | Status: AC
  Administered 2022-04-09: 2000 mg via INTRAVENOUS
  Filled 2022-04-09: qty 100

## 2022-04-09 MED ORDER — MAGNESIUM SULFATE 4 GM/100ML IV SOLN
4.0000 g | Freq: Once | INTRAVENOUS | Status: AC
  Administered 2022-04-09: 4 g via INTRAVENOUS
  Filled 2022-04-09: qty 100

## 2022-04-09 MED ORDER — POTASSIUM CHLORIDE CRYS ER 20 MEQ PO TBCR
60.0000 meq | EXTENDED_RELEASE_TABLET | ORAL | Status: AC
  Administered 2022-04-09 (×2): 60 meq via ORAL
  Filled 2022-04-09 (×2): qty 3

## 2022-04-09 NOTE — Progress Notes (Signed)
  Progress Note   Patient: Devon Hanson [id number] DOB: [birth year] DOA: 04/08/2022     1 DOS: the patient was seen and examined on 04/09/2022   Brief hospital course: 42 y.o. male with medical history significant of alcohol abuse, seizure disorder, PAF status post cardioversion, HTN, anxiety/depression, brought in by EMS for alcohol withdrawal and seizure   Assessment and Plan: Alcohol withdrawal with withdrawal seizure -Still having significant alcohol withdrawal symptoms PTA -Continue CIWA protocol -Correct electrolytes. -CIWA as high as 11 this AM with continued tremors, however overall is improving   PAF with RVR -Resume likely secondary to alcohol withdrawal and profound electrolytes depletion including hypokalemia, hypocalcemia and hypomagnesemia -Continue home dose of metoprolol -Add ASA 81   Severe hypokalemia -Secondary to repeated vomiting and metabolic acidosis -continue to replace as tolerated   Severe hypomagnesemia -continue to replace as tolerated   Hypocalcemia -Likely secondary to alcohol abuse -Remains low, continue to replace   High anion gap metabolic acidosis with lactic acidosis -Clinically suspect lactic acidosis, check lactic level -Much improved with IVF hydration   Elevated lipase -Denies any abdominal pain, doubt acute pancreatitis -Etiology might related to repeated nauseous vomiting -Cont diet as tolerated     Subjective: Reports feeling better today  Physical Exam: Vitals:   04/09/22 0842 04/09/22 1257 04/09/22 1419 04/09/22 1600  BP: (!) 144/104 (!) 136/107  (!) 130/99  Pulse: 77 81 93 89  Resp: 18 16  (!) 21  Temp: (!) 97.4 F (36.3 C) (!) 97.4 F (36.3 C)  99.1 F (37.3 C)  TempSrc: Oral Oral  Oral  SpO2: 100% 100%  100%  Weight:      Height:       General exam: Awake, laying in bed, in nad Respiratory system: Normal respiratory effort, no wheezing Cardiovascular system: regular rate, s1, s2 Gastrointestinal system:  Soft, nondistended, positive BS Central nervous system: CN2-12 grossly intact, strength intact, mild tremors Extremities: Perfused, no clubbing Skin: Normal skin turgor, no notable skin lesions seen Psychiatry: Mood normal // no visual hallucinations   Data Reviewed:  Labs reviewed: Na 139, K 3.1, Ca 7.0, Mg 1.4,  Family Communication: Pt in room, family at bedside  Disposition: Status is: Inpatient Remains inpatient appropriate because: Severity of illness  Planned Discharge Destination: Home    Author: Marylu Lund, MD 04/09/2022 6:04 PM  For on call review [email or web address].
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
## 2022-04-09 NOTE — TOC CAGE-AID Note (Signed)
Transition of Care [facility]) - CAGE-AID Screening   Patient Details  Name: Devon Hanson MRN: [id number] Date of Birth: May 03, 1980  Transition of Care [facility]) CM/SW Contact:    Pollie Friar, RN Phone Number: 04/09/2022, 1:10 PM   Clinical Narrative: Pt is interested in inpatient alcohol treatment. CM has provided him a list of the facilities in the area. Pt to call and see if any of the alcohol rehabs have availability and do assessment.    CAGE-AID Screening:    Have You Ever Felt You Ought to Cut Down on Your Drinking or Drug Use?: Yes Have People Annoyed You By Critizing Your Drinking Or Drug Use?: No Have You Felt Bad Or Guilty About Your Drinking Or Drug Use?: Yes Have You Ever Had a Drink or Used Drugs First Thing In The Morning to Steady Your Nerves or to Get Rid of a Hangover?: Yes CAGE-AID Score: 3  Substance Abuse Education Offered: Yes  Substance abuse interventions: Scientist, clinical (histocompatibility and immunogenetics), Patient Counseling

## 2022-04-09 NOTE — Hospital Course (Signed)
42 y.o. male with medical history significant of alcohol abuse, seizure disorder, PAF status post cardioversion, HTN, anxiety/depression, brought in by EMS for alcohol withdrawal and seizure

## 2022-04-10 DIAGNOSIS — E876 Hypokalemia: Secondary | ICD-10-CM | POA: Diagnosis not present

## 2022-04-10 DIAGNOSIS — F10939 Alcohol use, unspecified with withdrawal, unspecified: Secondary | ICD-10-CM | POA: Diagnosis not present

## 2022-04-10 DIAGNOSIS — I4891 Unspecified atrial fibrillation: Secondary | ICD-10-CM | POA: Diagnosis not present

## 2022-04-10 DIAGNOSIS — R569 Unspecified convulsions: Secondary | ICD-10-CM | POA: Diagnosis not present

## 2022-04-10 LAB — CBC
HCT: 28.7 % — ABNORMAL LOW (ref 39.0–52.0)
Hemoglobin: 10.5 g/dL — ABNORMAL LOW (ref 13.0–17.0)
MCH: 34.3 pg — ABNORMAL HIGH (ref 26.0–34.0)
MCHC: 36.6 g/dL — ABNORMAL HIGH (ref 30.0–36.0)
MCV: 93.8 fL (ref 80.0–100.0)
Platelets: 215 10*3/uL (ref 150–400)
RBC: 3.06 MIL/uL — ABNORMAL LOW (ref 4.22–5.81)
RDW: 13.9 % (ref 11.5–15.5)
WBC: 4.4 10*3/uL (ref 4.0–10.5)
nRBC: 0 % (ref 0.0–0.2)

## 2022-04-10 LAB — MAGNESIUM
Magnesium: 1.7 mg/dL (ref 1.7–2.4)
Magnesium: 2.2 mg/dL (ref 1.7–2.4)

## 2022-04-10 LAB — COMPREHENSIVE METABOLIC PANEL
ALT: 20 U/L (ref 0–44)
AST: 60 U/L — ABNORMAL HIGH (ref 15–41)
Albumin: 3.2 g/dL — ABNORMAL LOW (ref 3.5–5.0)
Alkaline Phosphatase: 87 U/L (ref 38–126)
Anion gap: 9 (ref 5–15)
BUN: <5 mg/dL — ABNORMAL LOW (ref 6–20)
CO2: 23 mmol/L (ref 22–32)
Calcium: 8 mg/dL — ABNORMAL LOW (ref 8.9–10.3)
Chloride: 104 mmol/L (ref 98–111)
Creatinine, Ser: 0.85 mg/dL (ref 0.61–1.24)
GFR, Estimated: >60 mL/min (ref >60)
Glucose, Bld: 91 mg/dL (ref 70–99)
Potassium: 3 mmol/L — ABNORMAL LOW (ref 3.5–5.1)
Sodium: 136 mmol/L (ref 135–145)
Total Bilirubin: 1 mg/dL (ref 0.3–1.2)
Total Protein: 6.4 g/dL — ABNORMAL LOW (ref 6.5–8.1)

## 2022-04-10 LAB — BASIC METABOLIC PANEL
Anion gap: 11 (ref 5–15)
BUN: 5 mg/dL — ABNORMAL LOW (ref 6–20)
CO2: 22 mmol/L (ref 22–32)
Calcium: 9 mg/dL (ref 8.9–10.3)
Chloride: 96 mmol/L — ABNORMAL LOW (ref 98–111)
Creatinine, Ser: 0.84 mg/dL (ref 0.61–1.24)
GFR, Estimated: >60 mL/min (ref >60)
Glucose, Bld: 103 mg/dL — ABNORMAL HIGH (ref 70–99)
Potassium: 3.5 mmol/L (ref 3.5–5.1)
Sodium: 129 mmol/L — ABNORMAL LOW (ref 135–145)

## 2022-04-10 MED ORDER — POTASSIUM CHLORIDE CRYS ER 20 MEQ PO TBCR
60.0000 meq | EXTENDED_RELEASE_TABLET | Freq: Once | ORAL | Status: AC
  Administered 2022-04-10: 60 meq via ORAL
  Filled 2022-04-10: qty 3

## 2022-04-10 MED ORDER — MAGNESIUM SULFATE 4 GM/100ML IV SOLN
4.0000 g | Freq: Once | INTRAVENOUS | Status: AC
  Administered 2022-04-10: 4 g via INTRAVENOUS
  Filled 2022-04-10: qty 100

## 2022-04-10 MED ORDER — CALCIUM GLUCONATE-NACL 2-0.675 GM/100ML-% IV SOLN
2.0000 g | Freq: Once | INTRAVENOUS | Status: AC
  Administered 2022-04-10: 2000 mg via INTRAVENOUS
  Filled 2022-04-10: qty 100

## 2022-04-10 MED ORDER — POTASSIUM CHLORIDE CRYS ER 20 MEQ PO TBCR
60.0000 meq | EXTENDED_RELEASE_TABLET | ORAL | Status: DC
  Administered 2022-04-10: 60 meq via ORAL
  Filled 2022-04-10 (×2): qty 3

## 2022-04-10 NOTE — Progress Notes (Signed)
  Progress Note   Patient: Devon Hanson [id number] DOB: 1980/11/08 DOA: 04/08/2022     2 DOS: the patient was seen and examined on 04/10/2022   Brief hospital course: 42 y.o. male with medical history significant of alcohol abuse, seizure disorder, PAF status post cardioversion, HTN, anxiety/depression, brought in by EMS for alcohol withdrawal and seizure   Assessment and Plan: Alcohol withdrawal with withdrawal seizure -Still having significant alcohol withdrawal symptoms PTA -Continue CIWA protocol -Lytes remain low and need to be replaced, see below -CIWA improving to 1 this AM   PAF with RVR -Resume likely secondary to alcohol withdrawal and profound electrolytes depletion including hypokalemia, hypocalcemia and hypomagnesemia -Continue home dose of metoprolol -Add ASA 81   Severe hypokalemia -Secondary to repeated vomiting and metabolic acidosis -K remained low this AM, replaced -Cont to recheck levels   Severe hypomagnesemia -remains low, cont to correct   Hypocalcemia -Likely secondary to alcohol abuse -Remains low this AM, continue to replace with IV Ca   High anion gap metabolic acidosis with lactic acidosis -Clinically suspect lactic acidosis, check lactic level -Much improved with IVF hydration   Elevated lipase -Denies any abdominal pain, doubt acute pancreatitis -Etiology might related to repeated nauseous vomiting -Cont diet as tolerated     Subjective: States feeling better  Physical Exam: Vitals:   04/10/22 0333 04/10/22 0742 04/10/22 1111 04/10/22 1537  BP: (!) 143/104 (!) 148/113 (!) 134/99 (!) 149/108  Pulse: 82 76 93 91  Resp: 15 18 18 18  $ Temp: 98.2 F (36.8 C) 98.2 F (36.8 C) 98.6 F (37 C) 99.7 F (37.6 C)  TempSrc: Oral Oral Oral Oral  SpO2: 100% 100% 98% 99%  Weight:      Height:       General exam: Conversant, in no acute distress Respiratory system: normal chest rise, clear, no audible wheezing Cardiovascular system:  regular rhythm, s1-s2 Gastrointestinal system: Nondistended, nontender, pos BS Central nervous system: No seizures, no tremors Extremities: No cyanosis, no joint deformities Skin: No rashes, no pallor Psychiatry: Affect normal // no auditory hallucinations   Data Reviewed:  Labs reviewed: Na 136, K 3.0, Cr 0.85, Mg 1.7, Ca 8.0,  Family Communication: Pt in room, family at bedside  Disposition: Status is: Inpatient Remains inpatient appropriate because: Severity of illness  Planned Discharge Destination: Home    Author: Marylu Lund, MD 04/10/2022 6:14 PM  For on call review [email or web address].

## 2022-04-11 ENCOUNTER — Encounter: Payer: Self-pay | Admitting: Nurse Practitioner

## 2022-04-11 DIAGNOSIS — I4891 Unspecified atrial fibrillation: Secondary | ICD-10-CM | POA: Diagnosis not present

## 2022-04-11 DIAGNOSIS — R569 Unspecified convulsions: Secondary | ICD-10-CM | POA: Diagnosis not present

## 2022-04-11 DIAGNOSIS — F10939 Alcohol use, unspecified with withdrawal, unspecified: Secondary | ICD-10-CM | POA: Diagnosis not present

## 2022-04-11 LAB — COMPREHENSIVE METABOLIC PANEL
ALT: 17 U/L (ref 0–44)
AST: 37 U/L (ref 15–41)
Albumin: 3.1 g/dL — ABNORMAL LOW (ref 3.5–5.0)
Alkaline Phosphatase: 77 U/L (ref 38–126)
Anion gap: 11 (ref 5–15)
BUN: <5 mg/dL — ABNORMAL LOW (ref 6–20)
CO2: 22 mmol/L (ref 22–32)
Calcium: 8.6 mg/dL — ABNORMAL LOW (ref 8.9–10.3)
Chloride: 103 mmol/L (ref 98–111)
Creatinine, Ser: 0.79 mg/dL (ref 0.61–1.24)
GFR, Estimated: >60 mL/min (ref >60)
Glucose, Bld: 98 mg/dL (ref 70–99)
Potassium: 3.9 mmol/L (ref 3.5–5.1)
Sodium: 136 mmol/L (ref 135–145)
Total Bilirubin: 0.6 mg/dL (ref 0.3–1.2)
Total Protein: 6.3 g/dL — ABNORMAL LOW (ref 6.5–8.1)

## 2022-04-11 LAB — MAGNESIUM: Magnesium: 1.8 mg/dL (ref 1.7–2.4)

## 2022-04-11 NOTE — Discharge Summary (Signed)
Physician Discharge Summary   Patient: Devon Hanson MRN: [id number] DOB: [birth year]  Admit date:     04/08/2022  Discharge date: 04/11/22  Discharge Physician: Marylu Lund   PCP: Lauree Chandler, NP   Recommendations at discharge:    Follow up with PCP in 1-2 weeks Recommend f/u BMET and Mg level in 1-2 weeks, focus on Ca, K, Mg  Discharge Diagnoses: Principal Problem:   Alcohol withdrawal ([place]) Active Problems:   Seizure ([place])   Shoulder joint instability, left   Alcohol use disorder, severe, in controlled environment [facility])   Seizure due to alcohol withdrawal ([place])   Essential hypertension  Resolved Problems:   * No resolved hospital problems. *  Hospital Course: 42 y.o. male with medical history significant of alcohol abuse, seizure disorder, PAF status post cardioversion, HTN, anxiety/depression, brought in by EMS for alcohol withdrawal and seizure   Assessment and Plan: Alcohol withdrawal with withdrawal seizure -Pt noted to have significant alcohol withdrawal symptoms PTA -Continued CIWA protocol with eventual resolution of withdrawal symptoms -Lytes were replaced   PAF with RVR -Resume likely secondary to alcohol withdrawal and profound electrolytes depletion including hypokalemia, hypocalcemia and hypomagnesemia -Continued home dose of metoprolol -Added ASA 81   Severe hypokalemia -Secondary to repeated vomiting and metabolic acidosis -K was replaced   Severe hypomagnesemia -replaced   Hypocalcemia -replaced   High anion gap metabolic acidosis with lactic acidosis -Clinically suspect lactic acidosis, check lactic level -Much improved with IVF hydration   Elevated lipase -Denies any abdominal pain, doubt acute pancreatitis -Etiology might related to repeated nauseous vomiting -tolerated regular diet  Hyponatremia -sodium noted to be down to 129. Staff reports pt drinking ample water. Pt was kept on fluid restriction over night with  normalization of Na by the following day        Consultants:  Procedures performed:   Disposition: Home Diet recommendation:  Regular diet DISCHARGE MEDICATION: Allergies as of 04/11/2022   No Known Allergies      Medication List     STOP taking these medications    aMILoride 5 MG tablet Commonly known as: MIDAMOR   Magnesium 400 MG Tabs       TAKE these medications    amLODipine 10 MG tablet Commonly known as: NORVASC Take 1 tablet (10 mg total) by mouth daily.   escitalopram 10 MG tablet Commonly known as: LEXAPRO Take 1 tablet (10 mg total) by mouth daily. APPOINTMENT OVERDUE What changed: additional instructions   folic acid 1 MG tablet Commonly known as: FOLVITE Take 1 mg by mouth daily.   ibuprofen 800 MG tablet Commonly known as: ADVIL Take 1 tablet (800 mg total) by mouth every 8 (eight) hours as needed for moderate pain.   levETIRAcetam 750 MG tablet Commonly known as: KEPPRA Take 1 tablet (750 mg total) by mouth 2 (two) times daily.   magnesium oxide 400 (240 Mg) MG tablet Commonly known as: MAG-OX Take 400 mg by mouth daily.   metoprolol succinate 50 MG 24 hr tablet Commonly known as: TOPROL-XL TAKE 1 TABLET BY MOUTH EVERY DAY   pantoprazole 40 MG tablet Commonly known as: PROTONIX Take 1 tablet (40 mg total) by mouth daily.   potassium chloride SA 20 MEQ tablet Commonly known as: KLOR-CON M To take 2 tablets today and 2 tablets tomorrow to replace potassium        Follow-up Information     Lauree Chandler, NP Follow up in 2 week(s).   Specialty: Geriatric Medicine  Why: Hospital follow up Contact information: [place]. [place] [address] [phone]                Discharge Exam: Filed Weights   04/08/22 0849 04/08/22 0945 04/08/22 1045  Weight: 79.8 kg 79 kg 79 kg   General exam: Awake, laying in bed, in nad Respiratory system: Normal respiratory effort, no wheezing Cardiovascular system:  regular rate, s1, s2 Gastrointestinal system: Soft, nondistended, positive BS Central nervous system: CN2-12 grossly intact, strength intact Extremities: Perfused, no clubbing Skin: Normal skin turgor, no notable skin lesions seen Psychiatry: Mood normal // no visual hallucinations    Condition at discharge: fair  The results of significant diagnostics from this hospitalization (including imaging, microbiology, ancillary and laboratory) are listed below for reference.   Imaging Studies: DG Chest Port 1 View  Result Date: 04/08/2022 CLINICAL DATA:  sob EXAM: PORTABLE CHEST 1 VIEW COMPARISON:  Chest x-ray 02/06/2022. FINDINGS: The heart size and mediastinal contours are within normal limits. Both lungs are clear. No visible pleural effusions or pneumothorax. No acute osseous abnormality. IMPRESSION: No active disease. Electronically Signed   By: Margaretha Sheffield M.D.   On: 04/08/2022 09:20    Microbiology: Results for orders placed or performed during the hospital encounter of 02/05/22  Urine Culture     Status: None   Collection Time: 02/05/22  7:38 PM   Specimen: Urine, Clean Catch  Result Value Ref Range Status   Specimen Description URINE, CLEAN CATCH  Final   Special Requests Normal  Final   Culture   Final    NO GROWTH Performed at [facility], [place] [address], [place], [place] [address]    Report Status 02/07/2022 FINAL  Final  Resp panel by RT-PCR (RSV, Flu A&B, Covid) Anterior Nasal Swab     Status: None   Collection Time: 02/05/22 10:11 PM   Specimen: Anterior Nasal Swab  Result Value Ref Range Status   SARS Coronavirus 2 by RT PCR NEGATIVE NEGATIVE Final    Comment: (NOTE) SARS-CoV-2 target nucleic acids are NOT DETECTED.  The SARS-CoV-2 RNA is generally detectable in upper respiratory specimens during the acute phase of infection. The lowest concentration of SARS-CoV-2 viral copies this assay can detect is 138 copies/mL. A negative result does not preclude  SARS-Cov-2 infection and should not be used as the sole basis for treatment or other patient management decisions. A negative result may occur with  improper specimen collection/handling, submission of specimen other than nasopharyngeal swab, presence of viral mutation(s) within the areas targeted by this assay, and inadequate number of viral copies(<138 copies/mL). A negative result must be combined with clinical observations, patient history, and epidemiological information. The expected result is Negative.  Fact Sheet for Patients:  EntrepreneurPulse.com.au  Fact Sheet for Healthcare Providers:  IncredibleEmployment.be  This test is no t yet approved or cleared by the [place] FDA and  has been authorized for detection and/or diagnosis of SARS-CoV-2 by FDA under an Emergency Use Authorization (EUA). This EUA will remain  in effect (meaning this test can be used) for the duration of the COVID-19 declaration under Section 564(b)(1) of the Act, 21 U.S.C.section 360bbb-3(b)(1), unless the authorization is terminated  or revoked sooner.       Influenza A by PCR NEGATIVE NEGATIVE Final   Influenza B by PCR NEGATIVE NEGATIVE Final    Comment: (NOTE) The Xpert Xpress SARS-CoV-2/FLU/RSV plus assay is intended as an aid in the diagnosis of influenza from Nasopharyngeal  swab specimens and should not be used as a sole basis for treatment. Nasal washings and aspirates are unacceptable for Xpert Xpress SARS-CoV-2/FLU/RSV testing.  Fact Sheet for Patients: EntrepreneurPulse.com.au  Fact Sheet for Healthcare Providers: IncredibleEmployment.be  This test is not yet approved or cleared by the [place] FDA and has been authorized for detection and/or diagnosis of SARS-CoV-2 by FDA under an Emergency Use Authorization (EUA). This EUA will remain in effect (meaning this test can be used) for the duration of  the COVID-19 declaration under Section 564(b)(1) of the Act, 21 U.S.C. section 360bbb-3(b)(1), unless the authorization is terminated or revoked.     Resp Syncytial Virus by PCR NEGATIVE NEGATIVE Final    Comment: (NOTE) Fact Sheet for Patients: EntrepreneurPulse.com.au  Fact Sheet for Healthcare Providers: IncredibleEmployment.be  This test is not yet approved or cleared by the [place] FDA and has been authorized for detection and/or diagnosis of SARS-CoV-2 by FDA under an Emergency Use Authorization (EUA). This EUA will remain in effect (meaning this test can be used) for the duration of the COVID-19 declaration under Section 564(b)(1) of the Act, 21 U.S.C. section 360bbb-3(b)(1), unless the authorization is terminated or revoked.  Performed at [facility], [place] [address], [place], [place] [address]   Culture, blood (Routine X 2) w Reflex to ID Panel     Status: None   Collection Time: 02/05/22 11:12 PM   Specimen: BLOOD LEFT FOREARM  Result Value Ref Range Status   Specimen Description BLOOD LEFT FOREARM  Final   Special Requests   Final    BOTTLES DRAWN AEROBIC AND ANAEROBIC Blood Culture adequate volume   Culture   Final    NO GROWTH 5 DAYS Performed at [facility], [place] [address] [place], [place] [address]    Report Status 02/10/2022 FINAL  Final  Culture, blood (Routine X 2) w Reflex to ID Panel     Status: None   Collection Time: 02/06/22  5:55 AM   Specimen: BLOOD  Result Value Ref Range Status   Specimen Description BLOOD RIGHT ANTECUBITAL  Final   Special Requests   Final    BOTTLES DRAWN AEROBIC AND ANAEROBIC Blood Culture results may not be optimal due to an inadequate volume of blood received in culture bottles   Culture   Final    NO GROWTH 5 DAYS Performed at [facility], [place] [address] [place],  [address]    Report Status 02/11/2022 FINAL  Final    Labs: CBC: Recent Labs  Lab  04/08/22 0849 04/10/22 0443  WBC 6.3 4.4  NEUTROABS 4.8  --   HGB 11.0* 10.5*  HCT 31.0* 28.7*  MCV 96.0 93.8  PLT 301 [id number]   Basic Metabolic Panel: Recent Labs  Lab 04/08/22 1339 04/08/22 1651 04/09/22 0810 04/10/22 0443 04/10/22 1731 04/11/22 0423  NA  --  134* 139 136 129* 136  K  --  2.6* 3.1* 3.0* 3.5 3.9  CL  --  99 108 104 96* 103  CO2  --  '22 22 23 22 22  '$ GLUCOSE  --  123* 90 91 103* 98  BUN  --  <5* <5* <5* 5* <5*  CREATININE  --  0.83 0.65 0.85 0.84 0.79  CALCIUM  --  7.0* 7.0* 8.0* 9.0 8.6*  MG  --  1.3* 1.4* 1.7 2.2 1.8  PHOS 1.6*  --  2.0*  --   --   --    Liver Function  Tests: Recent Labs  Lab 04/08/22 0849 04/10/22 0443 04/11/22 0423  AST 189* 60* 37  ALT '27 20 17  '$ ALKPHOS 98 87 77  BILITOT 0.9 1.0 0.6  PROT 6.7 6.4* 6.3*  ALBUMIN 3.4* 3.2* 3.1*   CBG: No results for input(s): "GLUCAP" in the last 168 hours.  Discharge time spent: less than 30 minutes.  Signed: Marylu Lund, MD [facility] 04/11/2022

## 2022-04-12 ENCOUNTER — Telehealth: Payer: Self-pay

## 2022-04-12 NOTE — Transitions of Care (Post Inpatient/ED Visit) (Signed)
   04/12/2022  Name: Devon Hanson MRN: [id number] DOB: September 25, 1980  Today's TOC FU Call Status: Today's TOC FU Call Status:: Unsuccessul Call (1st Attempt) Unsuccessful Call (1st Attempt) Date: 04/12/22  Attempted to reach the patient regarding the most recent Inpatient/ED visit.  Follow Up Plan: Additional outreach attempts will be made to reach the patient to complete the Transitions of Care (Post Inpatient/ED visit) call.      Enzo Montgomery, RN,BSN,CCM [facility] Care Management Care Management Community Coordinator Direct Phone: [phone] Toll Free: [phone] Fax: [phone]

## 2022-04-13 ENCOUNTER — Telehealth: Payer: Self-pay

## 2022-04-13 NOTE — Transitions of Care (Post Inpatient/ED Visit) (Signed)
   04/13/2022  Name: Devon Hanson MRN: [id number] DOB: 07-May-1980  Today's TOC FU Call Status: Today's TOC FU Call Status:: Successful TOC FU Call Competed TOC FU Call Complete Date: 04/13/22  Transition Care Management Follow-up Telephone Call Date of Discharge: 04/11/22 Discharge Facility: [facility]) Type of Discharge: Inpatient Admission Primary Inpatient Discharge Diagnosis:: "seizure, alcohol withdrawal" How have you been since you were released from the hospital?: Better Any questions or concerns?: Yes Patient Questions/Concerns:: pt reports he is out of refills on Potassium-he has already contacted provider to advise them  Items Reviewed: Did you receive and understand the discharge instructions provided?: Yes Medications obtained and verified?: Yes (Medications Reviewed) Any new allergies since your discharge?: No Dietary orders reviewed?: Yes Type of Diet Ordered:: regular Do you have support at home?: Yes People in Home: spouse Name of Support/Comfort Primary Source: [facility] and Equipment/Supplies: [place] Ordered?: No Any new equipment or medical supplies ordered?: No  Functional Questionnaire: Do you need assistance with bathing/showering or dressing?: No Do you need assistance with meal preparation?: No Do you need assistance with eating?: No Do you have difficulty maintaining continence: No Do you need assistance with getting out of bed/getting out of a chair/moving?: No Do you have difficulty managing or taking your medications?: No  Folllow up appointments reviewed: PCP Follow-up appointment confirmed?: Yes Date of PCP follow-up appointment?: 04/19/22 Follow-up Provider: Dani Gobble Specialist [facility] Follow-up appointment confirmed?: NA Do you need transportation to your follow-up appointment?: No Do you understand care options if your condition(s) worsen?: Yes-patient verbalized understanding  SDOH  Interventions Today    Flowsheet Row Most Recent Value  SDOH Interventions   Food Insecurity Interventions Patient Refused  Transportation Interventions Intervention Not Indicated  [pt denies any transportation issues-states he is able to take himself or family available to assist]      TOC Interventions Today    Flowsheet Row Most Recent Value  TOC Interventions   TOC Interventions Discussed/Reviewed TOC Interventions Discussed       Interventions Today    Flowsheet Row Most Recent Value  Education Interventions   Education Provided Provided Education  Provided Verbal Education On Nutrition, When to see the doctor, Other  [seizure mgmt, alcohol programs-pt has not decided regarding going into outpt programs-has info provied by inpatient CM and will follow up]  [place] Discussed/Reviewed Substance Abuse  Nutrition Interventions   Nutrition Discussed/Reviewed Nutrition Discussed  Pharmacy Interventions   Pharmacy Dicussed/Reviewed Pharmacy Topics Discussed, Medications and their functions  Safety Interventions   Safety Discussed/Reviewed Safety Discussed       Hetty Blend [facility] Care Management Care Management Community Coordinator Direct Phone: [phone] Toll Free: [phone] Fax: [phone]

## 2022-04-19 ENCOUNTER — Encounter: Payer: Self-pay | Admitting: Nurse Practitioner

## 2022-04-19 ENCOUNTER — Ambulatory Visit (INDEPENDENT_AMBULATORY_CARE_PROVIDER_SITE_OTHER): Payer: 59 | Admitting: Nurse Practitioner

## 2022-04-19 VITALS — BP 142/90 | HR 113 | Temp 97.5°F | Ht 68.0 in | Wt 187.0 lb

## 2022-04-19 DIAGNOSIS — R790 Abnormal level of blood mineral: Secondary | ICD-10-CM | POA: Diagnosis not present

## 2022-04-19 DIAGNOSIS — E781 Pure hyperglyceridemia: Secondary | ICD-10-CM

## 2022-04-19 DIAGNOSIS — E876 Hypokalemia: Secondary | ICD-10-CM | POA: Diagnosis not present

## 2022-04-19 DIAGNOSIS — F10930 Alcohol use, unspecified with withdrawal, uncomplicated: Secondary | ICD-10-CM | POA: Diagnosis not present

## 2022-04-19 DIAGNOSIS — R569 Unspecified convulsions: Secondary | ICD-10-CM

## 2022-04-19 DIAGNOSIS — F1011 Alcohol abuse, in remission: Secondary | ICD-10-CM

## 2022-04-19 DIAGNOSIS — I10 Essential (primary) hypertension: Secondary | ICD-10-CM

## 2022-04-19 DIAGNOSIS — R748 Abnormal levels of other serum enzymes: Secondary | ICD-10-CM

## 2022-04-19 NOTE — Progress Notes (Unsigned)
Careteam: Patient Care Team: Lauree Chandler, NP as PCP - General (Geriatric Medicine)  PLACE OF SERVICE:  [place] Directive information Does Patient Have a Medical Advance Directive?: No, Would patient like information on creating a medical advance directive?: No - Patient declined  Not on File  Chief Complaint  Patient presents with   Transitions Of Care    Follow up from recent hospital admission 04/08/22-04/11/22. Refill potassium. Patient is requesting contact information for a counselor.      HPI: Patient is a 42 y.o. male here for hospital f/u.  He was admitted to the hospital for alcohol withdrawal and seizure like activity.  Pt reports abstinence from alcohol since his admission. He is taking Keppra twice a day in addition to all of his other medications.  He says he feels great and does not have cravings for alcohol.  Denies palpitations, chest pain, shortness of breath. No tremors or seizure-like activity. No headaches, diarrhea, or constipation.   Bps at home are 130s-120s when he checks it at home. He is really focused on doing well for his 3 boys.  Has a lot of support with friends and family  Review of Systems:  Review of Systems  Constitutional:  Negative for chills, fever, malaise/fatigue and weight loss.  HENT:  Negative for congestion and sore throat.   Eyes:  Negative for blurred vision.  Respiratory:  Negative for cough, shortness of breath and wheezing.   Cardiovascular:  Negative for chest pain, palpitations and leg swelling.  Gastrointestinal:  Negative for abdominal pain, blood in stool, constipation, diarrhea, heartburn, nausea and vomiting.  Genitourinary:  Negative for dysuria, frequency, hematuria and urgency.  Musculoskeletal:  Negative for falls and joint pain.  Skin:  Negative for rash.  Neurological:  Negative for dizziness, tingling and headaches.  Endo/Heme/Allergies:  Negative for polydipsia.  Psychiatric/Behavioral:   Negative for depression. The patient is not nervous/anxious.     Past Medical History:  Diagnosis Date   Anxiety    Dysrhythmia    GERD (gastroesophageal reflux disease)    History of cardioversion 05/04/2017   Hypertension    Paroxysmal atrial fibrillation [facility])    Past Surgical History:  Procedure Laterality Date   NO PAST SURGERIES     SHOULDER ARTHROSCOPY WITH LABRAL REPAIR Left 09/08/2021   Procedure: LEFT SHOULDER ARTHROSCOPIC LABRAL REPAIR;  Surgeon: Meredith Pel, MD;  Location: [place];  Service: Orthopedics;  Laterality: Left;   Social History:   reports that he has never smoked. He has never used smokeless tobacco. He reports current alcohol use of about 1.0 standard drink of alcohol per week. He reports that he does not use drugs.  History reviewed. No pertinent family history.  Medications: Patient's Medications  New Prescriptions   No medications on file  Previous Medications   AMLODIPINE (NORVASC) 10 MG TABLET    Take 1 tablet (10 mg total) by mouth daily.   ESCITALOPRAM (LEXAPRO) 10 MG TABLET    Take 1 tablet (10 mg total) by mouth daily. APPOINTMENT OVERDUE   FOLIC ACID (FOLVITE) 1 MG TABLET    Take 1 mg by mouth daily.   IBUPROFEN (ADVIL) 800 MG TABLET    Take 1 tablet (800 mg total) by mouth every 8 (eight) hours as needed for moderate pain.   LEVETIRACETAM (KEPPRA) 750 MG TABLET    Take 1 tablet (750 mg total) by mouth 2 (two) times daily.   METOPROLOL SUCCINATE (TOPROL-XL) 50 MG 24 HR  TABLET    TAKE 1 TABLET BY MOUTH EVERY DAY   PANTOPRAZOLE (PROTONIX) 40 MG TABLET    Take 1 tablet (40 mg total) by mouth daily.   POTASSIUM CHLORIDE SA (KLOR-CON M) 20 MEQ TABLET    To take 2 tablets today and 2 tablets tomorrow to replace potassium  Modified Medications   No medications on file  Discontinued Medications   MAGNESIUM OXIDE (MAG-OX) 400 (240 MG) MG TABLET    Take 400 mg by mouth daily.    Physical Exam:  Vitals:   04/19/22 0948 04/19/22 1025  BP: (!)  150/92 (!) 142/90  Pulse: (!) 113   Temp: (!) 97.5 F (36.4 C)   TempSrc: Temporal   SpO2: 99%   Weight: 187 lb (84.8 kg)   Height: '5\' 8"'$  (1.727 m)    Body mass index is 28.43 kg/m. Wt Readings from Last 3 Encounters:  04/19/22 187 lb (84.8 kg)  04/08/22 174 lb 2.6 oz (79 kg)  03/10/22 185 lb 9.6 oz (84.2 kg)    Physical Exam Vitals and nursing note reviewed. Exam conducted with a chaperone present.  Cardiovascular:     Rate and Rhythm: Normal rate and regular rhythm.     Heart sounds: Normal heart sounds. No murmur heard. Pulmonary:     Breath sounds: Normal breath sounds. No wheezing or rales.  Abdominal:     General: Bowel sounds are normal.     Palpations: Abdomen is soft. There is no mass.     Tenderness: There is no abdominal tenderness. There is no guarding.  Musculoskeletal:        General: No swelling or tenderness.  Skin:    General: Skin is warm and dry.  Neurological:     Mental Status: He is alert and oriented to person, place, and time. Mental status is at baseline.  Psychiatric:        Mood and Affect: Mood normal.        Behavior: Behavior normal.        Judgment: Judgment normal.     Labs reviewed: Basic Metabolic Panel: Recent Labs    11/16/21 2039 12/31/21 0807 01/06/22 1422 02/06/22 1632 02/07/22 0418 02/07/22 1435 04/08/22 1339 04/08/22 1651 04/09/22 0810 04/10/22 0443 04/10/22 1731 04/11/22 0423  NA 136 141   < > 136 138   < >  --    < > 139 136 129* 136  K 3.7 3.1*   < > 2.6* 2.4*   < >  --    < > 3.1* 3.0* 3.5 3.9  CL 98 101   < > 103 105   < >  --    < > 108 104 96* 103  CO2 22 26   < > 24 24   < >  --    < > '22 23 22 22  '$ GLUCOSE 98 88   < > 108* 95   < >  --    < > 90 91 103* 98  BUN 5* 7   < > 6 <5*   < >  --    < > <5* <5* 5* <5*  CREATININE 0.84 0.88   < > 0.90 0.78   < >  --    < > 0.65 0.85 0.84 0.79  CALCIUM 9.9 8.8   < > 8.0* 7.8*   < >  --    < > 7.0* 8.0* 9.0 8.6*  MG 1.4*  --    < >  1.6* 1.7   < >  --    < > 1.4* 1.7  2.2 1.8  PHOS  --   --   --   --  3.1  --  1.6*  --  2.0*  --   --   --   TSH 10.827* 2.76  --  3.850  --   --   --   --   --   --   --   --    < > = values in this interval not displayed.   Liver Function Tests: Recent Labs    04/08/22 0849 04/10/22 0443 04/11/22 0423  AST 189* 60* 37  ALT '27 20 17  '$ ALKPHOS 98 87 77  BILITOT 0.9 1.0 0.6  PROT 6.7 6.4* 6.3*  ALBUMIN 3.4* 3.2* 3.1*   Recent Labs    04/08/22 0849 04/08/22 1339 04/09/22 0810  LIPASE 88*  --  111*  AMYLASE  --  133*  --    Recent Labs    02/07/22 0418  AMMONIA 47*   CBC: Recent Labs    01/21/22 1513 02/05/22 1735 04/08/22 0849 04/10/22 0443  WBC 4.4 5.6 6.3 4.4  NEUTROABS 2.1 4.8 4.8  --   HGB 14.9 12.4* 11.0* 10.5*  HCT 39.8 33.1* 31.0* 28.7*  MCV 92.1 95.7 96.0 93.8  PLT 238 228 301 215   Lipid Panel: Recent Labs    11/16/21 2039 12/31/21 0807  CHOL 305* 274*  HDL NOT REPORTED DUE TO HIGH TRIGLYCERIDES 63  LDLCALC UNABLE TO CALCULATE IF TRIGLYCERIDE OVER 400 mg/dL  --   TRIG 2,109* 877*  CHOLHDL NOT REPORTED DUE TO HIGH TRIGLYCERIDES 4.3  LDLDIRECT 22  --    TSH: Recent Labs    11/16/21 2039 12/31/21 0807 02/06/22 1632  TSH 10.827* 2.76 3.850   A1C: Lab Results  Component Value Date   HGBA1C 5.3 11/16/2021     Assessment/Plan 1. Hypokalemia Follow-up from hospital labs. Taking OTC potassium supplement because he did not have prescription refills at home.  - Complete Metabolic Panel with eGFR - Complete Metabolic Panel with eGFR; Future  2. Hypocalcemia Follow-up from hospital labs.  - Complete Metabolic Panel with eGFR - Complete Metabolic Panel with eGFR; Future  3. Low magnesium level Follow-up from hospital visit.  - Complete Metabolic Panel with eGFR; Future  4. Essential hypertension Reports BP checks at home are 120s-130s, he will continue to check 3x a week at home. Continue metoprolol and amlodipine.  - Complete Metabolic Panel with eGFR  5. History of  alcohol abuse Pt has information for outpatient rehab he is considering. He feels really good and is determined to stay away from alcohol and be healthy. Encouraged patient and offered support for abstaining from alcohol abuse/mental health resources if needed, he reports he has resources and plans to reach out to them.   6. 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 Avoid alcohol.  7. Elevated lipase Follow-up on hospital visit. Pt has a hx of alcohol abuse and elevated triglycerides in the past.  - Lipase; Future  8. Hypertriglyceridemia Triglycerides have been severely elevated in the past. Pt is not fasting today. He will return for fasting labs in the AM.  - Lipid panel; Future   Return in about 3 months (around 07/20/2022) for routine follow up .  Student- Archer Asa O'Berry ACPCNP-S  I personally was present during the history, physical exam and medical decision-making activities of this service and have verified that the service  and findings are accurately documented in the student's note Kyarra Vancamp K. Terminous, [facility] [phone]

## 2022-04-19 NOTE — Patient Instructions (Signed)
Come back in the morning for fasting blood work

## 2022-04-20 ENCOUNTER — Other Ambulatory Visit: Payer: 59

## 2022-04-20 DIAGNOSIS — E781 Pure hyperglyceridemia: Secondary | ICD-10-CM

## 2022-04-20 DIAGNOSIS — R790 Abnormal level of blood mineral: Secondary | ICD-10-CM

## 2022-04-20 DIAGNOSIS — R748 Abnormal levels of other serum enzymes: Secondary | ICD-10-CM

## 2022-04-20 DIAGNOSIS — E876 Hypokalemia: Secondary | ICD-10-CM

## 2022-04-21 LAB — COMPLETE METABOLIC PANEL WITH GFR
AG Ratio: 1.3 (calc) (ref 1.0–2.5)
ALT: 28 U/L (ref 9–46)
AST: 27 U/L (ref 10–40)
Albumin: 4.4 g/dL (ref 3.6–5.1)
Alkaline phosphatase (APISO): 64 U/L (ref 36–130)
BUN: 10 mg/dL (ref 7–25)
CO2: 29 mmol/L (ref 20–32)
Calcium: 10 mg/dL (ref 8.6–10.3)
Chloride: 104 mmol/L (ref 98–110)
Creat: 0.84 mg/dL (ref 0.60–1.26)
Globulin: 3.4 g/dL (calc) (ref 1.9–3.7)
Glucose, Bld: 90 mg/dL (ref 65–99)
Potassium: 4.6 mmol/L (ref 3.5–5.3)
Sodium: 141 mmol/L (ref 135–146)
Total Bilirubin: 0.4 mg/dL (ref 0.2–1.2)
Total Protein: 7.8 g/dL (ref 6.1–8.1)
eGFR: 114 mL/min/{1.73_m2} (ref >=60)

## 2022-04-21 LAB — LIPID PANEL
Cholesterol: 206 mg/dL — ABNORMAL HIGH (ref <200)
HDL: 39 mg/dL — ABNORMAL LOW (ref >=40)
LDL Cholesterol (Calc): 129 mg/dL (calc) — ABNORMAL HIGH
Non-HDL Cholesterol (Calc): 167 mg/dL (calc) — ABNORMAL HIGH (ref <130)
Total CHOL/HDL Ratio: 5.3 (calc) — ABNORMAL HIGH (ref <5.0)
Triglycerides: 250 mg/dL — ABNORMAL HIGH (ref <150)

## 2022-04-21 LAB — LIPASE: Lipase: 103 U/L — ABNORMAL HIGH (ref 7–60)

## 2022-04-30 ENCOUNTER — Encounter: Payer: Self-pay | Admitting: Nurse Practitioner

## 2022-04-30 DIAGNOSIS — R569 Unspecified convulsions: Secondary | ICD-10-CM

## 2022-05-04 ENCOUNTER — Telehealth: Payer: Self-pay | Admitting: Neurology

## 2022-05-04 NOTE — Telephone Encounter (Signed)
FYI-Pt called wanting to know when he will be released to drive. I explained to the pt that by [place] law he has to be seizure free for 6 months to be released. Pt stated that he just had one in Feb. Was not sure of exact date but he thinks it was on the 22nd of Feb.
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
## 2022-05-12 ENCOUNTER — Ambulatory Visit (INDEPENDENT_AMBULATORY_CARE_PROVIDER_SITE_OTHER): Payer: 59 | Admitting: Neurology

## 2022-05-12 ENCOUNTER — Encounter: Payer: Self-pay | Admitting: Neurology

## 2022-05-12 VITALS — BP 118/82 | HR 84 | Ht 68.0 in | Wt 193.0 lb

## 2022-05-12 DIAGNOSIS — F1090 Alcohol use, unspecified, uncomplicated: Secondary | ICD-10-CM | POA: Diagnosis not present

## 2022-05-12 DIAGNOSIS — R569 Unspecified convulsions: Secondary | ICD-10-CM

## 2022-05-12 DIAGNOSIS — F10930 Alcohol use, unspecified with withdrawal, uncomplicated: Secondary | ICD-10-CM

## 2022-05-12 MED ORDER — LEVETIRACETAM 750 MG PO TABS: 750.0000 mg | ORAL_TABLET | Freq: Two times a day (BID) | ORAL | 3 refills | Status: DC

## 2022-05-12 MED ORDER — MAGNESIUM OXIDE -MG SUPPLEMENT 400 (240 MG) MG PO TABS: 400.0000 mg | ORAL_TABLET | Freq: Every day | ORAL | 4 refills | Status: DC
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
## 2022-05-18 ENCOUNTER — Telehealth: Payer: Self-pay

## 2022-05-18 NOTE — Telephone Encounter (Signed)
Patient dropped of FMLA paperwork. Form placed on Desk of Sherrie Mustache, NP to complete and sign.

## 2022-05-21 NOTE — Telephone Encounter (Signed)
Form was faxed to number provided on form [phone].

## 2022-06-03 ENCOUNTER — Other Ambulatory Visit: Payer: Self-pay | Admitting: Nurse Practitioner

## 2022-07-06 DIAGNOSIS — Z0289 Encounter for other administrative examinations: Secondary | ICD-10-CM

## 2022-07-08 ENCOUNTER — Telehealth: Payer: Self-pay | Admitting: *Deleted

## 2022-07-08 NOTE — Telephone Encounter (Signed)
Gave completed/signed DMV paperwork back to medical records to process for pt. 

## 2022-07-16 ENCOUNTER — Ambulatory Visit: Payer: 59 | Admitting: Nurse Practitioner

## 2022-07-23 ENCOUNTER — Ambulatory Visit: Payer: 59 | Admitting: Nurse Practitioner

## 2022-08-06 ENCOUNTER — Ambulatory Visit: Payer: 59 | Admitting: Nurse Practitioner

## 2022-08-16 ENCOUNTER — Ambulatory Visit: Payer: 59 | Admitting: Nurse Practitioner

## 2022-08-18 ENCOUNTER — Ambulatory Visit: Payer: 59 | Admitting: Nurse Practitioner

## 2022-08-20 ENCOUNTER — Ambulatory Visit: Payer: 59 | Admitting: Nurse Practitioner

## 2022-08-22 ENCOUNTER — Other Ambulatory Visit: Payer: Self-pay

## 2022-08-22 ENCOUNTER — Emergency Department (HOSPITAL_COMMUNITY)
Admission: EM | Admit: 2022-08-22 | Discharge: 2022-08-22 | Disposition: A | Discharge status: Home / Self Care | Payer: 59 | Attending: Emergency Medicine | Admitting: Emergency Medicine

## 2022-08-22 ENCOUNTER — Encounter (HOSPITAL_COMMUNITY): Payer: Self-pay | Admitting: Emergency Medicine

## 2022-08-22 DIAGNOSIS — R531 Weakness: Secondary | ICD-10-CM

## 2022-08-22 DIAGNOSIS — E876 Hypokalemia: Secondary | ICD-10-CM | POA: Insufficient documentation

## 2022-08-22 DIAGNOSIS — R1013 Epigastric pain: Secondary | ICD-10-CM | POA: Insufficient documentation

## 2022-08-22 DIAGNOSIS — Z1152 Encounter for screening for COVID-19: Secondary | ICD-10-CM | POA: Insufficient documentation

## 2022-08-22 DIAGNOSIS — R000 Tachycardia, unspecified: Secondary | ICD-10-CM | POA: Insufficient documentation

## 2022-08-22 LAB — HEPATIC FUNCTION PANEL
ALT: 51 U/L — ABNORMAL HIGH (ref 0–44)
AST: 100 U/L — ABNORMAL HIGH (ref 15–41)
Albumin: 4 g/dL (ref 3.5–5.0)
Alkaline Phosphatase: 68 U/L (ref 38–126)
Bilirubin, Direct: <0.1 mg/dL (ref 0.0–0.2)
Total Bilirubin: 0.7 mg/dL (ref 0.3–1.2)
Total Protein: 8.6 g/dL — ABNORMAL HIGH (ref 6.5–8.1)

## 2022-08-22 LAB — BASIC METABOLIC PANEL
Anion gap: 18 — ABNORMAL HIGH (ref 5–15)
BUN: 12 mg/dL (ref 6–20)
CO2: 19 mmol/L — ABNORMAL LOW (ref 22–32)
Calcium: 8.8 mg/dL — ABNORMAL LOW (ref 8.9–10.3)
Chloride: 99 mmol/L (ref 98–111)
Creatinine, Ser: 1 mg/dL (ref 0.61–1.24)
GFR, Estimated: >60 mL/min (ref >60)
Glucose, Bld: 108 mg/dL — ABNORMAL HIGH (ref 70–99)
Potassium: 3.3 mmol/L — ABNORMAL LOW (ref 3.5–5.1)
Sodium: 136 mmol/L (ref 135–145)

## 2022-08-22 LAB — CBC
HCT: 47.5 % (ref 39.0–52.0)
Hemoglobin: 16.4 g/dL (ref 13.0–17.0)
MCH: 29.4 pg (ref 26.0–34.0)
MCHC: 34.5 g/dL (ref 30.0–36.0)
MCV: 85.1 fL (ref 80.0–100.0)
Platelets: 291 10*3/uL (ref 150–400)
RBC: 5.58 MIL/uL (ref 4.22–5.81)
RDW: 12.9 % (ref 11.5–15.5)
WBC: 3.1 10*3/uL — ABNORMAL LOW (ref 4.0–10.5)
nRBC: 0 % (ref 0.0–0.2)

## 2022-08-22 LAB — RESP PANEL BY RT-PCR (RSV, FLU A&B, COVID)  RVPGX2
Influenza A by PCR: NEGATIVE
Influenza B by PCR: NEGATIVE
Resp Syncytial Virus by PCR: NEGATIVE
SARS Coronavirus 2 by RT PCR: NEGATIVE

## 2022-08-22 LAB — URINALYSIS, ROUTINE W REFLEX MICROSCOPIC
Bilirubin Urine: NEGATIVE
Glucose, UA: NEGATIVE mg/dL
Ketones, ur: NEGATIVE mg/dL
Leukocytes,Ua: NEGATIVE
Nitrite: NEGATIVE
Protein, ur: 100 mg/dL — AB
Specific Gravity, Urine: 1.011 (ref 1.005–1.030)
pH: 5 (ref 5.0–8.0)

## 2022-08-22 LAB — TROPONIN I (HIGH SENSITIVITY): Troponin I (High Sensitivity): 10 ng/L (ref <18)

## 2022-08-22 LAB — LIPASE, BLOOD: Lipase: 38 U/L (ref 11–51)

## 2022-08-22 LAB — CBG MONITORING, ED: Glucose-Capillary: 114 mg/dL — ABNORMAL HIGH (ref 70–99)

## 2022-08-22 MED ORDER — ALUM & MAG HYDROXIDE-SIMETH 200-200-20 MG/5ML PO SUSP
30.0000 mL | Freq: Once | ORAL | Status: AC
  Administered 2022-08-22: 30 mL via ORAL
  Filled 2022-08-22: qty 30

## 2022-08-22 MED ORDER — LACTATED RINGERS IV BOLUS
1000.0000 mL | Freq: Once | INTRAVENOUS | Status: AC
  Administered 2022-08-22: 1000 mL via INTRAVENOUS

## 2022-08-22 MED ORDER — LIDOCAINE VISCOUS HCL 2 % MT SOLN
15.0000 mL | Freq: Once | OROMUCOSAL | Status: AC
  Administered 2022-08-22: 15 mL via ORAL
  Filled 2022-08-22: qty 15

## 2022-08-22 MED ORDER — POTASSIUM CHLORIDE CRYS ER 20 MEQ PO TBCR
20.0000 meq | EXTENDED_RELEASE_TABLET | Freq: Once | ORAL | Status: AC
  Administered 2022-08-22: 20 meq via ORAL
  Filled 2022-08-22: qty 1

## 2022-08-22 MED ORDER — LORAZEPAM 1 MG PO TABS
1.0000 mg | ORAL_TABLET | Freq: Once | ORAL | Status: AC
  Administered 2022-08-22: 1 mg via ORAL
  Filled 2022-08-22: qty 1

## 2022-08-22 NOTE — Discharge Instructions (Signed)
As we discussed please continue taking your Protonix, you can use over-the-counter Maalox, Mylanta for any stomach irritation, I recommend that you drink plenty of fluids including Pedialyte, Gatorade to help with your hydration.  Please return if you have significant worsening fatigue, continue taking your seizure medications, and do your best to stop drinking alcohol at this time.

## 2022-08-22 NOTE — ED Provider Notes (Signed)
[place] EMERGENCY DEPARTMENT AT [facility] Provider Note   CSN: [id number] Arrival date & time: 08/22/22  1222     History  Chief Complaint  Patient presents with   Weakness   Fatigue    Devon Hanson is a 42 y.o. male who presents to ED concerned for mild epigastric pain and generalized weakness x5 days. Works 2 jobs and wondering if heat is getting to him. Also endorses drinking at least 1 pint of ETOH yesterday and 1-2 drinks daily since 08/19/2022. Tried drinking 1 alcohol drink today which did not help symptoms. Non-bloody emesis x1 today. Non-bloody diarrhea x1 yesterday.   Denies fever, cough, chest pain, dyspnea, dysuria, hematuria, nausea, sore throat.   Weakness      Home Medications Prior to Admission medications   Medication Sig Start Date End Date Taking? Authorizing Provider  amLODipine (NORVASC) 10 MG tablet Take 1 tablet (10 mg total) by mouth daily. 03/10/22   Ngetich, Dinah C, NP  escitalopram (LEXAPRO) 10 MG tablet Take 1 tablet (10 mg total) by mouth daily. APPOINTMENT OVERDUE 03/10/22   Ngetich, Dinah C, NP  folic acid (FOLVITE) 1 MG tablet Take 1 mg by mouth daily. Patient not taking: Reported on 05/12/2022 02/09/22   [provider]  ibuprofen (ADVIL) 800 MG tablet Take 1 tablet (800 mg total) by mouth every 8 (eight) hours as needed for moderate pain. 05/29/21   Leath-Warren, Sadie Haber, NP  levETIRAcetam (KEPPRA) 750 MG tablet Take 1 tablet (750 mg total) by mouth 2 (two) times daily. 05/12/22   Sater, Pearletha Furl, MD  magnesium oxide (MAG-OX) 400 (240 Mg) MG tablet Take 1 tablet (400 mg total) by mouth daily. 05/12/22   Sater, Pearletha Furl, MD  metoprolol succinate (TOPROL-XL) 50 MG 24 hr tablet TAKE 1 TABLET BY MOUTH EVERY DAY 06/03/22   Sharon Seller, NP  pantoprazole (PROTONIX) 40 MG tablet Take 1 tablet (40 mg total) by mouth daily. 03/08/22   Sharon Seller, NP  potassium chloride SA (KLOR-CON M) 20 MEQ tablet To take 2 tablets  today and 2 tablets tomorrow to replace potassium Patient not taking: Reported on 05/12/2022 02/08/22   Vonna Drafts, MD      Allergies    Patient has no known allergies.    Review of Systems   Review of Systems  Neurological:  Positive for weakness.    Physical Exam Updated Vital Signs BP (!) 139/104   Pulse (!) 111   Temp 98.9 F (37.2 C)   Resp 16   Ht 5\' 8"  (1.727 m)   Wt 91.6 kg   SpO2 99%   BMI 30.71 kg/m  Physical Exam Vitals and nursing note reviewed.  Constitutional:      General: He is not in acute distress.    Appearance: He is not ill-appearing or toxic-appearing.  HENT:     Head: Normocephalic and atraumatic.     Mouth/Throat:     Mouth: Mucous membranes are moist.     Pharynx: No oropharyngeal exudate or posterior oropharyngeal erythema.  Eyes:     General: No scleral icterus.       Right eye: No discharge.        Left eye: No discharge.     Conjunctiva/sclera: Conjunctivae normal.  Cardiovascular:     Rate and Rhythm: Tachycardia present.     Pulses: Normal pulses.     Heart sounds: Normal heart sounds. No murmur heard. Pulmonary:     Effort: Pulmonary effort  is normal. No respiratory distress.     Breath sounds: Normal breath sounds. No wheezing, rhonchi or rales.  Abdominal:     General: Abdomen is flat. Bowel sounds are normal. There is no distension.     Palpations: Abdomen is soft. There is no mass.     Tenderness: There is no abdominal tenderness.  Musculoskeletal:     Right lower leg: No edema.     Left lower leg: No edema.  Skin:    General: Skin is warm and dry.     Findings: No rash.  Neurological:     General: No focal deficit present.     Mental Status: He is alert. Mental status is at baseline.     Comments: GCS 15. Speech is goal oriented. No deficits appreciated to CN III-XII; symmetric eyebrow raise, no facial drooping, tongue midline. Patient has equal grip strength bilaterally with 5/5 strength against resistance in all major  muscle groups bilaterally. Sensation to light touch intact. Patient ambulatory with steady gait.   Psychiatric:        Mood and Affect: Mood normal.        Behavior: Behavior normal.     ED Results / Procedures / Treatments   Labs (all labs ordered are listed, but only abnormal results are displayed) Labs Reviewed  BASIC METABOLIC PANEL - Abnormal; Notable for the following components:      Result Value   Potassium 3.3 (*)    CO2 19 (*)    Glucose, Bld 108 (*)    Calcium 8.8 (*)    Anion gap 18 (*)    All other components within normal limits  CBC - Abnormal; Notable for the following components:   WBC 3.1 (*)    All other components within normal limits  URINALYSIS, ROUTINE W REFLEX MICROSCOPIC - Abnormal; Notable for the following components:   Hgb urine dipstick SMALL (*)    Protein, ur 100 (*)    Bacteria, UA RARE (*)    All other components within normal limits  CBG MONITORING, ED - Abnormal; Notable for the following components:   Glucose-Capillary 114 (*)    All other components within normal limits  RESP PANEL BY RT-PCR (RSV, FLU A&B, COVID)  RVPGX2  HEPATIC FUNCTION PANEL  LIPASE, BLOOD  TROPONIN I (HIGH SENSITIVITY)    EKG EKG Interpretation Date/Time:  Sunday August 22 2022 12:06:52 EDT Ventricular Rate:  117 PR Interval:  176 QRS Duration:  76 QT Interval:  320 QTC Calculation: 446 R Axis:   -7  Text Interpretation: Sinus tachycardia Septal infarct , age undetermined Abnormal ECG When compared with ECG of 08-Apr-2022 11:12, No significant change since last tracing Confirmed by Alvira Monday ([id number]) on 08/22/2022 1:30:16 PM  Radiology No results found.  Procedures Procedures    Medications Ordered in ED Medications  potassium chloride SA (KLOR-CON M) CR tablet 20 mEq (has no administration in time range)  lactated ringers bolus 1,000 mL (has no administration in time range)  alum & mag hydroxide-simeth (MAALOX/MYLANTA) 200-200-20 MG/5ML suspension  30 mL (has no administration in time range)    And  lidocaine (XYLOCAINE) 2 % viscous mouth solution 15 mL (has no administration in time range)  LORazepam (ATIVAN) tablet 1 mg (has no administration in time range)    ED Course/ Medical Decision Making/ A&P Clinical Course as of 08/22/22 1517  Sun Aug 22, 2022  1505 Weakness, general malaise, epigastric pain, diarrhea, vomiting. Hx of Etoh abuse. We do not  think he's withdrawing at this time.  [CP]    Clinical Course User Index [CP] Olene Floss, PA-C                             Medical Decision Making Amount and/or Complexity of Data Reviewed Labs: ordered.  Risk OTC drugs. Prescription drug management.   This patient presents to the ED for concern of weakness, this involves an extensive number of treatment options, and is a complaint that carries with it a high risk of complications and morbidity.  The differential diagnosis includes hypoglycemia, electrolyte abnormality, sepsis, ACS, carbon monoxide poisoning, anemia, dehydration.   Co morbidities that complicate the patient evaluation  none    Lab Tests:  I Ordered, and personally interpreted labs.  The pertinent results include:   -CBG: 114 - BMP: mild hypokalemia - CBC: No concern for anemia or leukocytosis - UA: not concerning for infection - Resp panel: pending - Hepatic function panel: pending - Troponin: pending   Cardiac Monitoring: / EKG:  The patient was maintained on a cardiac monitor.  I personally viewed and interpreted the cardiac monitored which showed an underlying rhythm of: Tachycardia without ST elevations or arrhythmias.    Problem List / ED Course / Critical interventions / Medication management  Patient presents with generalized weakness, mild epigastric pain, vomiting x 1, diarrhea x 1.  Patient endorses drinking alcohol over the past 5 days.  Denies drinking recently before 08/19/2022.  Patient denying any other infectious  symptoms such as cough, dysuria, sore throat. UA without concern for infection.  CBC without leukocytosis or anemia.  BMP with mild hypokalemia-patient was given a oral potassium supplement in ED today.  Rest of lab work pending. I have reviewed the patients home medicines and have made adjustments as needed   DDX: these are considered less likely due to history of present illness and physical exam findings -Hypoglycemia/electrolyte abnormality: CMP/BMP without concern -sepsis: no fever; vital signs stable -ACS: EKG sinus rhythm and no acute ST changes -anemia: CBC/CMP/BMP without concern  3:17 PM Care of Maureen Haagen transferred to [employer] [facility] at the end of my shift as the patient will require reassessment once labs/imaging have resulted. Patient presentation, ED course, and plan of care discussed with review of all pertinent labs and imaging. Please see his/her note for further details regarding further ED course and disposition. Plan at time of handoff is reassess patient after fluids, troponin, lipase, and Resp panel results. I anticipate patient should be eligible for discharge with supportive care. This may be altered or completely changed at the discretion of the oncoming team pending results of further workup.    Social Determinants of Health:  none           Final Clinical Impression(s) / ED Diagnoses Final diagnoses:  None    Rx / DC Orders ED Discharge Orders     None         Dorthy Cooler, [place] 08/22/22 1518    Alvira Monday, MD 08/23/22 2104
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
## 2022-08-22 NOTE — ED Notes (Signed)
Spoke with lab able to add on blood work at this time.

## 2022-08-22 NOTE — ED Triage Notes (Addendum)
Pt BIB EMS for complaints of generalized weakness and just not feeling well x 5 days. Several days ago had abd discomfort but does not any longer. Pt feels overly tired. Denies any current pain. Vomiting this morning and diarrhea yesterday. Pt states he has a lot going on right now and had one alcoholic drink this morning hoping it would make him feel beeter but it did not.

## 2022-08-31 ENCOUNTER — Emergency Department (HOSPITAL_COMMUNITY)
Admission: EM | Admit: 2022-08-31 | Discharge: 2022-08-31 | Discharge status: Against Medical Advice | Payer: Self-pay | Attending: Emergency Medicine | Admitting: Emergency Medicine

## 2022-08-31 ENCOUNTER — Other Ambulatory Visit: Payer: Self-pay

## 2022-08-31 DIAGNOSIS — R569 Unspecified convulsions: Secondary | ICD-10-CM | POA: Insufficient documentation

## 2022-08-31 DIAGNOSIS — R112 Nausea with vomiting, unspecified: Secondary | ICD-10-CM | POA: Insufficient documentation

## 2022-08-31 DIAGNOSIS — Z5321 Procedure and treatment not carried out due to patient leaving prior to being seen by health care provider: Secondary | ICD-10-CM | POA: Insufficient documentation

## 2022-08-31 DIAGNOSIS — F430 Acute stress reaction: Secondary | ICD-10-CM | POA: Insufficient documentation

## 2022-08-31 LAB — CBC
HCT: 45.7 % (ref 39.0–52.0)
Hemoglobin: 16.3 g/dL (ref 13.0–17.0)
MCH: 29.6 pg (ref 26.0–34.0)
MCHC: 35.7 g/dL (ref 30.0–36.0)
MCV: 83.1 fL (ref 80.0–100.0)
Platelets: 107 10*3/uL — ABNORMAL LOW (ref 150–400)
RBC: 5.5 MIL/uL (ref 4.22–5.81)
RDW: 12.6 % (ref 11.5–15.5)
WBC: 2.8 10*3/uL — ABNORMAL LOW (ref 4.0–10.5)
nRBC: 0 % (ref 0.0–0.2)

## 2022-08-31 LAB — COMPREHENSIVE METABOLIC PANEL
ALT: 71 U/L — ABNORMAL HIGH (ref 0–44)
AST: 154 U/L — ABNORMAL HIGH (ref 15–41)
Albumin: 4.2 g/dL (ref 3.5–5.0)
Alkaline Phosphatase: 100 U/L (ref 38–126)
Anion gap: 20 — ABNORMAL HIGH (ref 5–15)
BUN: 12 mg/dL (ref 6–20)
CO2: 18 mmol/L — ABNORMAL LOW (ref 22–32)
Calcium: 9 mg/dL (ref 8.9–10.3)
Chloride: 92 mmol/L — ABNORMAL LOW (ref 98–111)
Creatinine, Ser: 0.98 mg/dL (ref 0.61–1.24)
GFR, Estimated: >60 mL/min (ref >60)
Glucose, Bld: 133 mg/dL — ABNORMAL HIGH (ref 70–99)
Potassium: 3.3 mmol/L — ABNORMAL LOW (ref 3.5–5.1)
Sodium: 130 mmol/L — ABNORMAL LOW (ref 135–145)
Total Bilirubin: 1.3 mg/dL — ABNORMAL HIGH (ref 0.3–1.2)
Total Protein: 8.5 g/dL — ABNORMAL HIGH (ref 6.5–8.1)

## 2022-08-31 LAB — LIPASE, BLOOD: Lipase: 81 U/L — ABNORMAL HIGH (ref 11–51)

## 2022-08-31 NOTE — ED Triage Notes (Signed)
Pt reports n/v x few days states also has seizures that are "stressed induced" and has been under a lot of stress lately.

## 2022-09-01 ENCOUNTER — Telehealth: Payer: Self-pay

## 2022-09-01 NOTE — Transitions of Care (Post Inpatient/ED Visit) (Signed)
   09/01/2022  Name: Devon Hanson MRN: [id number] DOB: 04/12/1980  Today's TOC FU Call Status: Today's TOC FU Call Status:: Unsuccessful Call (2nd Attempt) Unsuccessful Call (1st Attempt) Date: 09/01/22 Unsuccessful Call (2nd Attempt) Date: 09/01/22  Attempted to reach the patient regarding the most recent Inpatient/ED visit.  Follow Up Plan: Additional outreach attempts will be made to reach the patient to complete the Transitions of Care (Post Inpatient/ED visit) call.   Signature : [facility]

## 2022-09-02 ENCOUNTER — Other Ambulatory Visit: Payer: Self-pay | Admitting: Nurse Practitioner

## 2022-09-06 ENCOUNTER — Emergency Department (HOSPITAL_COMMUNITY): Payer: Self-pay

## 2022-09-06 ENCOUNTER — Inpatient Hospital Stay (HOSPITAL_COMMUNITY)
Admission: EM | Admit: 2022-09-06 | Discharge: 2022-09-08 | DRG: 100 | Disposition: A | Discharge status: Home / Self Care | Payer: Self-pay | Attending: Internal Medicine | Admitting: Internal Medicine

## 2022-09-06 ENCOUNTER — Observation Stay (HOSPITAL_BASED_OUTPATIENT_CLINIC_OR_DEPARTMENT_OTHER): Payer: Self-pay

## 2022-09-06 ENCOUNTER — Other Ambulatory Visit: Payer: Self-pay

## 2022-09-06 ENCOUNTER — Encounter (HOSPITAL_COMMUNITY): Payer: Self-pay

## 2022-09-06 DIAGNOSIS — D709 Neutropenia, unspecified: Secondary | ICD-10-CM | POA: Diagnosis present

## 2022-09-06 DIAGNOSIS — I4891 Unspecified atrial fibrillation: Secondary | ICD-10-CM

## 2022-09-06 DIAGNOSIS — R112 Nausea with vomiting, unspecified: Secondary | ICD-10-CM | POA: Insufficient documentation

## 2022-09-06 DIAGNOSIS — I480 Paroxysmal atrial fibrillation: Secondary | ICD-10-CM | POA: Insufficient documentation

## 2022-09-06 DIAGNOSIS — R748 Abnormal levels of other serum enzymes: Secondary | ICD-10-CM | POA: Insufficient documentation

## 2022-09-06 DIAGNOSIS — E876 Hypokalemia: Secondary | ICD-10-CM | POA: Diagnosis present

## 2022-09-06 DIAGNOSIS — Z79899 Other long term (current) drug therapy: Secondary | ICD-10-CM

## 2022-09-06 DIAGNOSIS — F1090 Alcohol use, unspecified, uncomplicated: Secondary | ICD-10-CM | POA: Diagnosis present

## 2022-09-06 DIAGNOSIS — D696 Thrombocytopenia, unspecified: Secondary | ICD-10-CM | POA: Diagnosis present

## 2022-09-06 DIAGNOSIS — E871 Hypo-osmolality and hyponatremia: Secondary | ICD-10-CM | POA: Insufficient documentation

## 2022-09-06 DIAGNOSIS — Z91148 Patient's other noncompliance with medication regimen for other reason: Secondary | ICD-10-CM

## 2022-09-06 DIAGNOSIS — G40909 Epilepsy, unspecified, not intractable, without status epilepticus: Principal | ICD-10-CM | POA: Diagnosis present

## 2022-09-06 DIAGNOSIS — K219 Gastro-esophageal reflux disease without esophagitis: Secondary | ICD-10-CM | POA: Diagnosis present

## 2022-09-06 DIAGNOSIS — R569 Unspecified convulsions: Secondary | ICD-10-CM

## 2022-09-06 DIAGNOSIS — F411 Generalized anxiety disorder: Secondary | ICD-10-CM | POA: Diagnosis present

## 2022-09-06 DIAGNOSIS — K859 Acute pancreatitis without necrosis or infection, unspecified: Secondary | ICD-10-CM | POA: Diagnosis present

## 2022-09-06 DIAGNOSIS — I10 Essential (primary) hypertension: Secondary | ICD-10-CM | POA: Diagnosis present

## 2022-09-06 HISTORY — DX: Unspecified convulsions: R56.9

## 2022-09-06 LAB — URINALYSIS, ROUTINE W REFLEX MICROSCOPIC
Bacteria, UA: NONE SEEN
Bilirubin Urine: NEGATIVE
Glucose, UA: NEGATIVE mg/dL
Ketones, ur: NEGATIVE mg/dL
Leukocytes,Ua: NEGATIVE
Nitrite: NEGATIVE
Protein, ur: NEGATIVE mg/dL
Specific Gravity, Urine: 1.008 (ref 1.005–1.030)
pH: 6 (ref 5.0–8.0)

## 2022-09-06 LAB — COMPREHENSIVE METABOLIC PANEL
ALT: 97 U/L — ABNORMAL HIGH (ref 0–44)
AST: 331 U/L — ABNORMAL HIGH (ref 15–41)
Albumin: 3.2 g/dL — ABNORMAL LOW (ref 3.5–5.0)
Alkaline Phosphatase: 114 U/L (ref 38–126)
Anion gap: 16 — ABNORMAL HIGH (ref 5–15)
BUN: 8 mg/dL (ref 6–20)
CO2: 21 mmol/L — ABNORMAL LOW (ref 22–32)
Calcium: 8.2 mg/dL — ABNORMAL LOW (ref 8.9–10.3)
Chloride: 87 mmol/L — ABNORMAL LOW (ref 98–111)
Creatinine, Ser: 1.11 mg/dL (ref 0.61–1.24)
GFR, Estimated: >60 mL/min (ref >60)
Glucose, Bld: 118 mg/dL — ABNORMAL HIGH (ref 70–99)
Potassium: 2.5 mmol/L — CL (ref 3.5–5.1)
Sodium: 124 mmol/L — ABNORMAL LOW (ref 135–145)
Total Bilirubin: 1.1 mg/dL (ref 0.3–1.2)
Total Protein: 6.5 g/dL (ref 6.5–8.1)

## 2022-09-06 LAB — LIPASE, BLOOD: Lipase: 142 U/L — ABNORMAL HIGH (ref 11–51)

## 2022-09-06 LAB — ECHOCARDIOGRAM COMPLETE
AR max vel: 4.26 cm2
AV Peak grad: 7.2 mmHg
Ao pk vel: 1.34 m/s
Area-P 1/2: 4.21 cm2
Height: 68 in
S' Lateral: 2.4 cm
Weight: 3248 oz

## 2022-09-06 LAB — BASIC METABOLIC PANEL
Anion gap: 13 (ref 5–15)
BUN: 5 mg/dL — ABNORMAL LOW (ref 6–20)
CO2: 22 mmol/L (ref 22–32)
Calcium: 8.1 mg/dL — ABNORMAL LOW (ref 8.9–10.3)
Chloride: 91 mmol/L — ABNORMAL LOW (ref 98–111)
Creatinine, Ser: 1.15 mg/dL (ref 0.61–1.24)
GFR, Estimated: >60 mL/min (ref >60)
Glucose, Bld: 103 mg/dL — ABNORMAL HIGH (ref 70–99)
Potassium: 2.6 mmol/L — CL (ref 3.5–5.1)
Sodium: 126 mmol/L — ABNORMAL LOW (ref 135–145)

## 2022-09-06 LAB — OSMOLALITY: Osmolality: 269 mOsm/kg — ABNORMAL LOW (ref 275–295)

## 2022-09-06 LAB — CBC
HCT: 35.6 % — ABNORMAL LOW (ref 39.0–52.0)
Hemoglobin: 13 g/dL (ref 13.0–17.0)
MCH: 29.5 pg (ref 26.0–34.0)
MCHC: 36.5 g/dL — ABNORMAL HIGH (ref 30.0–36.0)
MCV: 80.9 fL (ref 80.0–100.0)
Platelets: 75 10*3/uL — ABNORMAL LOW (ref 150–400)
RBC: 4.4 MIL/uL (ref 4.22–5.81)
RDW: 12.4 % (ref 11.5–15.5)
WBC: 3 10*3/uL — ABNORMAL LOW (ref 4.0–10.5)
nRBC: 0 % (ref 0.0–0.2)

## 2022-09-06 LAB — MAGNESIUM: Magnesium: 1.1 mg/dL — ABNORMAL LOW (ref 1.7–2.4)

## 2022-09-06 MED ORDER — POTASSIUM CHLORIDE 10 MEQ/100ML IV SOLN
10.0000 meq | INTRAVENOUS | Status: AC
  Administered 2022-09-06 (×5): 10 meq via INTRAVENOUS
  Filled 2022-09-06 (×5): qty 100

## 2022-09-06 MED ORDER — ONDANSETRON HCL 4 MG/2ML IJ SOLN
4.0000 mg | Freq: Once | INTRAMUSCULAR | Status: AC
  Administered 2022-09-06: 4 mg via INTRAVENOUS
  Filled 2022-09-06: qty 2

## 2022-09-06 MED ORDER — ACETAMINOPHEN 325 MG PO TABS
650.0000 mg | ORAL_TABLET | Freq: Four times a day (QID) | ORAL | Status: DC | PRN
  Filled 2022-09-06: qty 2

## 2022-09-06 MED ORDER — MAGNESIUM SULFATE 2 GM/50ML IV SOLN
2.0000 g | Freq: Once | INTRAVENOUS | Status: AC
  Administered 2022-09-06: 2 g via INTRAVENOUS
  Filled 2022-09-06: qty 50

## 2022-09-06 MED ORDER — METOPROLOL TARTRATE 5 MG/5ML IV SOLN
5.0000 mg | Freq: Once | INTRAVENOUS | Status: AC
  Administered 2022-09-06: 5 mg via INTRAVENOUS

## 2022-09-06 MED ORDER — LORAZEPAM 2 MG/ML IJ SOLN: 1.0000 mg | INTRAMUSCULAR | Status: DC | PRN

## 2022-09-06 MED ORDER — FAMOTIDINE IN NACL 20-0.9 MG/50ML-% IV SOLN
20.0000 mg | Freq: Once | INTRAVENOUS | Status: AC
  Administered 2022-09-06: 20 mg via INTRAVENOUS
  Filled 2022-09-06: qty 50

## 2022-09-06 MED ORDER — PANTOPRAZOLE SODIUM 40 MG PO TBEC
40.0000 mg | DELAYED_RELEASE_TABLET | Freq: Every day | ORAL | Status: DC
  Administered 2022-09-07 – 2022-09-08 (×2): 40 mg via ORAL
  Filled 2022-09-06 (×2): qty 1

## 2022-09-06 MED ORDER — ADULT MULTIVITAMIN W/MINERALS CH
1.0000 | ORAL_TABLET | Freq: Every day | ORAL | Status: DC
  Administered 2022-09-07 – 2022-09-08 (×2): 1 via ORAL
  Filled 2022-09-06 (×2): qty 1

## 2022-09-06 MED ORDER — IOHEXOL 350 MG/ML SOLN
75.0000 mL | Freq: Once | INTRAVENOUS | Status: AC | PRN
  Administered 2022-09-06: 75 mL via INTRAVENOUS

## 2022-09-06 MED ORDER — THIAMINE HCL 100 MG/ML IJ SOLN: 100.0000 mg | Freq: Every day | INTRAMUSCULAR | Status: DC

## 2022-09-06 MED ORDER — THIAMINE MONONITRATE 100 MG PO TABS
100.0000 mg | ORAL_TABLET | Freq: Every day | ORAL | Status: DC
  Administered 2022-09-07 – 2022-09-08 (×2): 100 mg via ORAL
  Filled 2022-09-06 (×2): qty 1

## 2022-09-06 MED ORDER — MAGNESIUM SULFATE IN D5W 1-5 GM/100ML-% IV SOLN
1.0000 g | Freq: Once | INTRAVENOUS | Status: AC
  Administered 2022-09-06: 1 g via INTRAVENOUS
  Filled 2022-09-06: qty 100

## 2022-09-06 MED ORDER — LEVETIRACETAM IN NACL 1000 MG/100ML IV SOLN
1000.0000 mg | Freq: Once | INTRAVENOUS | Status: AC
  Administered 2022-09-06: 1000 mg via INTRAVENOUS
  Filled 2022-09-06: qty 100

## 2022-09-06 MED ORDER — SODIUM CHLORIDE 0.9 % IV SOLN: INTRAVENOUS | Status: DC

## 2022-09-06 MED ORDER — ESCITALOPRAM OXALATE 10 MG PO TABS
10.0000 mg | ORAL_TABLET | Freq: Every day | ORAL | Status: DC
  Administered 2022-09-07 – 2022-09-08 (×2): 10 mg via ORAL
  Filled 2022-09-06 (×2): qty 1

## 2022-09-06 MED ORDER — POLYETHYLENE GLYCOL 3350 17 G PO PACK: 17.0000 g | PACK | Freq: Every day | ORAL | Status: DC | PRN

## 2022-09-06 MED ORDER — LACTATED RINGERS IV SOLN: INTRAVENOUS | Status: DC

## 2022-09-06 MED ORDER — LORAZEPAM 2 MG/ML IJ SOLN: 2.0000 mg | INTRAMUSCULAR | Status: DC | PRN

## 2022-09-06 MED ORDER — ENOXAPARIN SODIUM 100 MG/ML IJ SOSY
1.0000 mg/kg | PREFILLED_SYRINGE | Freq: Two times a day (BID) | INTRAMUSCULAR | Status: DC
  Administered 2022-09-07 (×2): 92.5 mg via SUBCUTANEOUS
  Filled 2022-09-06 (×3): qty 0.93

## 2022-09-06 MED ORDER — LACTATED RINGERS IV BOLUS
1000.0000 mL | Freq: Once | INTRAVENOUS | Status: AC
  Administered 2022-09-06: 1000 mL via INTRAVENOUS

## 2022-09-06 MED ORDER — MORPHINE SULFATE (PF) 4 MG/ML IV SOLN
4.0000 mg | Freq: Once | INTRAVENOUS | Status: AC
  Administered 2022-09-06: 4 mg via INTRAVENOUS
  Filled 2022-09-06: qty 1

## 2022-09-06 MED ORDER — LEVETIRACETAM 750 MG PO TABS
750.0000 mg | ORAL_TABLET | Freq: Two times a day (BID) | ORAL | Status: DC
  Administered 2022-09-06 – 2022-09-08 (×4): 750 mg via ORAL
  Filled 2022-09-06 (×4): qty 1

## 2022-09-06 MED ORDER — ACETAMINOPHEN 650 MG RE SUPP: 650.0000 mg | Freq: Four times a day (QID) | RECTAL | Status: DC | PRN

## 2022-09-06 MED ORDER — FOLIC ACID 1 MG PO TABS
1.0000 mg | ORAL_TABLET | Freq: Every day | ORAL | Status: DC
  Administered 2022-09-07 – 2022-09-08 (×2): 1 mg via ORAL
  Filled 2022-09-06 (×2): qty 1

## 2022-09-06 MED ORDER — POTASSIUM CHLORIDE 10 MEQ/100ML IV SOLN
10.0000 meq | INTRAVENOUS | Status: AC
  Administered 2022-09-06 – 2022-09-07 (×4): 10 meq via INTRAVENOUS
  Filled 2022-09-06 (×4): qty 100

## 2022-09-06 MED ORDER — SODIUM CHLORIDE 0.9% FLUSH
3.0000 mL | Freq: Two times a day (BID) | INTRAVENOUS | Status: DC
  Administered 2022-09-06 – 2022-09-07 (×3): 3 mL via INTRAVENOUS

## 2022-09-06 MED ORDER — PROCHLORPERAZINE EDISYLATE 10 MG/2ML IJ SOLN
10.0000 mg | Freq: Once | INTRAMUSCULAR | Status: DC
  Filled 2022-09-06: qty 2

## 2022-09-06 MED ORDER — LEVETIRACETAM 500 MG PO TABS
750.0000 mg | ORAL_TABLET | Freq: Once | ORAL | Status: AC
  Administered 2022-09-06: 750 mg via ORAL
  Filled 2022-09-06: qty 1

## 2022-09-06 MED ORDER — METOPROLOL TARTRATE 5 MG/5ML IV SOLN
INTRAVENOUS | Status: AC
  Administered 2022-09-06: 2.5 mg via INTRAVENOUS
  Filled 2022-09-06: qty 5

## 2022-09-06 MED ORDER — ENOXAPARIN SODIUM 100 MG/ML IJ SOSY
1.0000 mg/kg | PREFILLED_SYRINGE | Freq: Two times a day (BID) | INTRAMUSCULAR | Status: DC
  Filled 2022-09-06: qty 0.93

## 2022-09-06 MED ORDER — METOPROLOL SUCCINATE ER 50 MG PO TB24
50.0000 mg | ORAL_TABLET | Freq: Every day | ORAL | Status: DC
  Administered 2022-09-07 – 2022-09-08 (×2): 50 mg via ORAL
  Filled 2022-09-06 (×2): qty 1

## 2022-09-06 MED ORDER — LORAZEPAM 1 MG PO TABS: 1.0000 mg | ORAL_TABLET | ORAL | Status: DC | PRN

## 2022-09-06 MED ORDER — POTASSIUM CHLORIDE 10 MEQ/100ML IV SOLN
10.0000 meq | INTRAVENOUS | Status: DC
  Administered 2022-09-06 (×3): 10 meq via INTRAVENOUS
  Filled 2022-09-06 (×3): qty 100

## 2022-09-06 MED ORDER — LORAZEPAM 2 MG/ML IJ SOLN
1.0000 mg | Freq: Once | INTRAMUSCULAR | Status: AC
  Administered 2022-09-06: 2 mg via INTRAVENOUS
  Filled 2022-09-06: qty 1

## 2022-09-06 NOTE — Progress Notes (Signed)
Echocardiogram 2D Echocardiogram has been performed.  Devon Hanson 09/06/2022, 3:51 PM

## 2022-09-06 NOTE — Plan of Care (Signed)
  Problem: Education: Goal: Expressions of having a comfortable level of knowledge regarding the disease process will increase Outcome: Progressing   Problem: Coping: Goal: Ability to identify appropriate support needs will improve Outcome: Progressing   Problem: Safety: Goal: Verbalization of understanding the information provided will improve Outcome: Progressing

## 2022-09-06 NOTE — ED Triage Notes (Signed)
Abdominal pain with nausea and vomiting onset 24 hrs. Last vomited ago (yellow bile)  Hr-120  B/P 150/80 O2 100%  Ems give 4mg  zofran and 1 lit LR CBG- 150
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
## 2022-09-06 NOTE — ED Provider Notes (Signed)
[facility] AT [facility] Provider Note   CSN: [id number] Arrival date & time: 09/06/22  4098     History  Chief Complaint  Patient presents with   Abdominal Pain    Devon Hanson is a 42 y.o. male.  I independently patient is a 42 year old male with a history of seizure disorder, hypertension, GERD, regular alcohol use and anxiety who is presenting today with complaints of abdominal pain, nausea vomiting and diarrhea that is been present for several days now.  Patient has had some diarrhea, mild cough.  He denies any urinary symptoms.  Unsure if he has had a fever.  No hematemesis or melena.  Patient reports he typically drinks every other day but if he does not have alcohol other than feeling anxious he does not have withdrawal symptoms.  He is compliant with his medications and did take his seizure medicine yesterday but they told him not to take anything this morning.  Patient reports he last had an episode of emesis while in the ambulance.  He cannot recall the last time he ate and held anything down.  The history is provided by the patient, the EMS personnel and medical records.  Abdominal Pain      Home Medications Prior to Admission medications   Medication Sig Start Date End Date Taking? Authorizing Provider  amLODipine (NORVASC) 10 MG tablet Take 1 tablet (10 mg total) by mouth daily. 03/10/22   Ngetich, Dinah C, NP  escitalopram (LEXAPRO) 10 MG tablet Take 1 tablet (10 mg total) by mouth daily. APPOINTMENT OVERDUE 03/10/22   Ngetich, Dinah C, NP  folic acid (FOLVITE) 1 MG tablet Take 1 mg by mouth daily. Patient not taking: Reported on 05/12/2022 02/09/22   [provider]  ibuprofen (ADVIL) 800 MG tablet Take 1 tablet (800 mg total) by mouth every 8 (eight) hours as needed for moderate pain. 05/29/21   Leath-Warren, Sadie Haber, NP  levETIRAcetam (KEPPRA) 750 MG tablet Take 1 tablet (750 mg total) by mouth 2 (two) times daily. 05/12/22    Sater, Pearletha Furl, MD  magnesium oxide (MAG-OX) 400 (240 Mg) MG tablet Take 1 tablet (400 mg total) by mouth daily. 05/12/22   Sater, Pearletha Furl, MD  metoprolol succinate (TOPROL-XL) 50 MG 24 hr tablet TAKE 1 TABLET BY MOUTH EVERY DAY 06/03/22   Sharon Seller, NP  pantoprazole (PROTONIX) 40 MG tablet TAKE 1 TABLET BY MOUTH EVERY DAY 09/02/22   Sharon Seller, NP  potassium chloride SA (KLOR-CON M) 20 MEQ tablet To take 2 tablets today and 2 tablets tomorrow to replace potassium Patient not taking: Reported on 05/12/2022 02/08/22   Vonna Drafts, MD      Allergies    Patient has no known allergies.    Review of Systems   Review of Systems  Gastrointestinal:  Positive for abdominal pain.    Physical Exam Updated Vital Signs BP 121/76   Pulse (!) 101   Temp 99.3 F (37.4 C) (Oral)   Resp 16   Ht 5\' 8"  (1.727 m)   Wt 92.1 kg   SpO2 100%   BMI 30.87 kg/m  Physical Exam Vitals and nursing note reviewed.  Constitutional:      General: He is not in acute distress.    Appearance: He is well-developed.  HENT:     Head: Normocephalic and atraumatic.     Mouth/Throat:     Mouth: Mucous membranes are dry.  Eyes:     Conjunctiva/sclera:  Conjunctivae normal.     Pupils: Pupils are equal, round, and reactive to light.  Cardiovascular:     Rate and Rhythm: Regular rhythm. Tachycardia present.     Heart sounds: No murmur heard. Pulmonary:     Effort: Pulmonary effort is normal. No respiratory distress.     Breath sounds: Normal breath sounds. No wheezing or rales.  Abdominal:     General: There is no distension.     Palpations: Abdomen is soft.     Tenderness: There is abdominal tenderness in the epigastric area and left upper quadrant. There is no guarding or rebound.  Musculoskeletal:        General: No tenderness. Normal range of motion.     Cervical back: Normal range of motion and neck supple.     Right lower leg: No edema.     Left lower leg: No edema.  Skin:     General: Skin is warm and dry.     Findings: No erythema or rash.  Neurological:     Mental Status: He is alert and oriented to person, place, and time. Mental status is at baseline.  Psychiatric:        Mood and Affect: Mood normal.        Behavior: Behavior normal.     ED Results / Procedures / Treatments   Labs (all labs ordered are listed, but only abnormal results are displayed) Labs Reviewed  LIPASE, BLOOD - Abnormal; Notable for the following components:      Result Value   Lipase 142 (*)    All other components within normal limits  COMPREHENSIVE METABOLIC PANEL - Abnormal; Notable for the following components:   Sodium 124 (*)    Potassium 2.5 (*)    Chloride 87 (*)    CO2 21 (*)    Glucose, Bld 118 (*)    Calcium 8.2 (*)    Albumin 3.2 (*)    AST 331 (*)    ALT 97 (*)    Anion gap 16 (*)    All other components within normal limits  CBC - Abnormal; Notable for the following components:   WBC 3.0 (*)    HCT 35.6 (*)    MCHC 36.5 (*)    Platelets 75 (*)    All other components within normal limits  URINALYSIS, ROUTINE W REFLEX MICROSCOPIC - Abnormal; Notable for the following components:   Hgb urine dipstick SMALL (*)    All other components within normal limits  MAGNESIUM    EKG EKG Interpretation Date/Time:  Monday September 06 2022 08:44:42 EDT Ventricular Rate:  122 PR Interval:  127 QRS Duration:  84 QT Interval:  359 QTC Calculation: 512 R Axis:   123  Text Interpretation: Sinus tachycardia Low voltage with right axis deviation Anteroseptal infarct, old Nonspecific T abnormalities, inferior leads new Prolonged QT interval Confirmed by Gwyneth Sprout ([id number]) on 09/06/2022 9:15:30 AM  Radiology CT ABDOMEN PELVIS W CONTRAST  Result Date: 09/06/2022 CLINICAL DATA:  Abdominal pain, acute, nonlocalized EXAM: CT ABDOMEN AND PELVIS WITH CONTRAST TECHNIQUE: Multidetector CT imaging of the abdomen and pelvis was performed using the standard protocol following  bolus administration of intravenous contrast. RADIATION DOSE REDUCTION: This exam was performed according to the departmental dose-optimization program which includes automated exposure control, adjustment of the mA and/or kV according to patient size and/or use of iterative reconstruction technique. CONTRAST:  75mL OMNIPAQUE IOHEXOL 350 MG/ML SOLN COMPARISON:  CT scan abdomen and pelvis from 10/21/2021. FINDINGS: Lower  chest: There are dependent changes in the right lung base. Bilateral imaged lungs otherwise clear. No overt consolidation. No pleural effusion. The heart is normal in size. No pericardial effusion. Hepatobiliary: The liver is normal in size. Non-cirrhotic configuration. No suspicious mass. These is heterogeneous moderate-to-marked diffuse hepatic steatosis. No intrahepatic or extrahepatic bile duct dilation. No calcified gallstones. Normal gallbladder wall thickness. No pericholecystic inflammatory changes. Pancreas: Unremarkable. No pancreatic ductal dilatation or surrounding inflammatory changes. Spleen: Within normal limits. No focal lesion. Adrenals/Urinary Tract: Adrenal glands are unremarkable. No suspicious renal mass. No hydronephrosis. No renal or ureteric calculi. Unremarkable urinary bladder. Stomach/Bowel: There is a small sliding hiatal hernia. No disproportionate dilation of the small or large bowel loops. No evidence of abnormal bowel wall thickening or inflammatory changes. The appendix is unremarkable. Vascular/Lymphatic: No ascites or pneumoperitoneum. No abdominal or pelvic lymphadenopathy, by size criteria. No aneurysmal dilation of the major abdominal arteries. Reproductive: Normal size prostate. Redemonstration of asymmetrically mildly enlarged right seminal vesicle in comparison to the left. However, no focal mass or surrounding fat stranding noted. Findings are similar to the prior study and of minimal clinical significance. Other: The visualized soft tissues and abdominal  wall are unremarkable. Musculoskeletal: No suspicious osseous lesions. There is mild anterior wedging deformity of T10 through T12 vertebral bodies, similar to the prior study. No significant retropulsion or spinal canal compromise. L1 superior endplate Schmorl's node noted. IMPRESSION: 1. No acute inflammatory process identified within the abdomen or pelvis. 2. Mild anterior wedging deformity of T10 through T12 vertebral bodies, similar to the prior study. No significant retropulsion or spinal canal compromise. 3. Multiple other nonacute observations, as described above. Electronically Signed   By: Jules Schick M.D.   On: 09/06/2022 11:24    Procedures Procedures    Medications Ordered in ED Medications  lactated ringers infusion ( Intravenous Not Given 09/06/22 0947)  potassium chloride 10 mEq in 100 mL IVPB (0 mEq Intravenous Stopped 09/06/22 1215)  lactated ringers bolus 1,000 mL (0 mLs Intravenous Stopped 09/06/22 0838)  ondansetron (ZOFRAN) injection 4 mg (4 mg Intravenous Given 09/06/22 0734)  morphine (PF) 4 MG/ML injection 4 mg (4 mg Intravenous Given 09/06/22 0734)  famotidine (PEPCID) IVPB 20 mg premix (0 mg Intravenous Stopped 09/06/22 0838)  levETIRAcetam (KEPPRA) tablet 750 mg (750 mg Oral Given 09/06/22 0738)  lactated ringers bolus 1,000 mL (0 mLs Intravenous Stopped 09/06/22 1018)  LORazepam (ATIVAN) injection 1 mg (2 mg Intravenous Given 09/06/22 0952)  levETIRAcetam (KEPPRA) IVPB 1000 mg/100 mL premix (0 mg Intravenous Stopped 09/06/22 1018)  magnesium sulfate IVPB 2 g 50 mL (0 g Intravenous Stopped 09/06/22 1215)  metoprolol tartrate (LOPRESSOR) injection 5 mg (5 mg Intravenous Not Given 09/06/22 0954)  iohexol (OMNIPAQUE) 350 MG/ML injection 75 mL (75 mLs Intravenous Contrast Given 09/06/22 1051)    ED Course/ Medical Decision Making/ A&P                             Medical Decision Making Amount and/or Complexity of Data Reviewed Independent Historian: EMS External Data  Reviewed: notes. Labs: ordered. Decision-making details documented in ED Course. Radiology: ordered and independent interpretation performed. Decision-making details documented in ED Course.  Risk Prescription drug management.   Pt with multiple medical problems and comorbidities and presenting today with a complaint that caries a high risk for morbidity and mortality.  Here today with abdominal pain nausea vomiting and diarrhea.  Concern for pancreatitis versus gastritis versus  viral etiology versus hepatitis.  Lower suspicion for cholecystitis, appendicitis, diverticulitis.  Also concern for dehydration.  Patient is tolerating water in the room and did take his antiepileptic's yesterday but has not had his morning dose.  Will give patient IV fluid, pain and nausea control.  Labs are pending.  Patient given a dose of his Keppra.  12:16 PM Pt unfortunately vomited the keppra and has had persisent vomiting.  I independently interpreted and reviewed pt's labs and today pt has new hyponatremia of 124, hypokalemia of 2.5 and elevated AST of 331 with normal bilirubin.  Lipase elevated at 142 and AG of 16.  CBC with no changes.  On re-evaluation pt is still symptomatic and not tolerating po's.  Still having nausea.  He interpreted patient's EKG and he is in a sinus tachycardia today but does have prolonged QT most likely from Zofran he received earlier.  Will give Ativan although do not feel like patient is going through alcohol withdrawal at this time.  Patient replaced potassium given an IV dose of Keppra and continued on fluids.  CT pending.  Findings discussed with the patient.  Given his significant electrolyte abnormalities feel that patient will require admission for improvement of those also to ensure he is tolerating p.o.'s before discharge home.  Findings discussed with the patient and he is comfortable with this plan.  12:16 PM Right after leaving the room and speaking with the patient he had a  [place] seizure that lasted approximately 1 minute which then caused him to go into atrial fibrillation for a short time.  He was given 2 mg of Ativan, patient is now sleepy and postictal.  After 2.5 of IV Lopressor A-fib resolved and he is now back into sinus tachycardia.  12:16 PM I have independently visualized and interpreted pt's images today.  CT without acute findings today.  Radiology did note mild anterior wedge deformity at T10-T12 similar to prior study but no acute findings.  Patient has had no further seizing and heart rate has been in sinus rhythm in the low 100s.  Will admit for above issues.  Consulted hospitalist for admission.  CRITICAL CARE Performed by: Jerica Creegan Total critical care time: 45 minutes Critical care time was exclusive of separately billable procedures and treating other patients. Critical care was necessary to treat or prevent imminent or life-threatening deterioration. Critical care was time spent personally by me on the following activities: development of treatment plan with patient and/or surrogate as well as nursing, discussions with consultants, evaluation of patient's response to treatment, examination of patient, obtaining history from patient or surrogate, ordering and performing treatments and interventions, ordering and review of laboratory studies, ordering and review of radiographic studies, pulse oximetry and re-evaluation of patient's condition.             Final Clinical Impression(s) / ED Diagnoses Final diagnoses:  Nausea vomiting and diarrhea  Hyponatremia  Hypokalemia  Seizure (HCC)  Paroxysmal atrial fibrillation [facility])    Rx / DC Orders ED Discharge Orders     None         Gwyneth Sprout, MD 09/06/22 1216
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
## 2022-09-07 LAB — CBC
HCT: 35.1 % — ABNORMAL LOW (ref 39.0–52.0)
Hemoglobin: 13 g/dL (ref 13.0–17.0)
MCH: 31 pg (ref 26.0–34.0)
MCHC: 37 g/dL — ABNORMAL HIGH (ref 30.0–36.0)
MCV: 83.6 fL (ref 80.0–100.0)
Platelets: 72 10*3/uL — ABNORMAL LOW (ref 150–400)
RBC: 4.2 MIL/uL — ABNORMAL LOW (ref 4.22–5.81)
RDW: 12.9 % (ref 11.5–15.5)
WBC: 3.7 10*3/uL — ABNORMAL LOW (ref 4.0–10.5)
nRBC: 0 % (ref 0.0–0.2)

## 2022-09-07 LAB — BASIC METABOLIC PANEL
Anion gap: 10 (ref 5–15)
Anion gap: 11 (ref 5–15)
Anion gap: 14 (ref 5–15)
BUN: <5 mg/dL — ABNORMAL LOW (ref 6–20)
BUN: <5 mg/dL — ABNORMAL LOW (ref 6–20)
BUN: <5 mg/dL — ABNORMAL LOW (ref 6–20)
CO2: 22 mmol/L (ref 22–32)
CO2: 23 mmol/L (ref 22–32)
CO2: 24 mmol/L (ref 22–32)
Calcium: 8.1 mg/dL — ABNORMAL LOW (ref 8.9–10.3)
Calcium: 8.2 mg/dL — ABNORMAL LOW (ref 8.9–10.3)
Calcium: 8.3 mg/dL — ABNORMAL LOW (ref 8.9–10.3)
Chloride: 92 mmol/L — ABNORMAL LOW (ref 98–111)
Chloride: 95 mmol/L — ABNORMAL LOW (ref 98–111)
Chloride: 97 mmol/L — ABNORMAL LOW (ref 98–111)
Creatinine, Ser: 0.91 mg/dL (ref 0.61–1.24)
Creatinine, Ser: 1 mg/dL (ref 0.61–1.24)
Creatinine, Ser: 1.16 mg/dL (ref 0.61–1.24)
GFR, Estimated: >60 mL/min (ref >60)
GFR, Estimated: >60 mL/min (ref >60)
GFR, Estimated: >60 mL/min (ref >60)
Glucose, Bld: 107 mg/dL — ABNORMAL HIGH (ref 70–99)
Glucose, Bld: 127 mg/dL — ABNORMAL HIGH (ref 70–99)
Glucose, Bld: 95 mg/dL (ref 70–99)
Potassium: 2.6 mmol/L — CL (ref 3.5–5.1)
Potassium: 2.8 mmol/L — ABNORMAL LOW (ref 3.5–5.1)
Potassium: 3 mmol/L — ABNORMAL LOW (ref 3.5–5.1)
Sodium: 128 mmol/L — ABNORMAL LOW (ref 135–145)
Sodium: 130 mmol/L — ABNORMAL LOW (ref 135–145)
Sodium: 130 mmol/L — ABNORMAL LOW (ref 135–145)

## 2022-09-07 LAB — OSMOLALITY, URINE: Osmolality, Ur: 136 mOsm/kg — ABNORMAL LOW (ref 300–900)

## 2022-09-07 LAB — SODIUM, URINE, RANDOM: Sodium, Ur: 39 mmol/L

## 2022-09-07 LAB — MAGNESIUM: Magnesium: 2.1 mg/dL (ref 1.7–2.4)

## 2022-09-07 MED ORDER — POTASSIUM CHLORIDE CRYS ER 20 MEQ PO TBCR
40.0000 meq | EXTENDED_RELEASE_TABLET | ORAL | Status: AC
  Administered 2022-09-07 (×3): 40 meq via ORAL
  Filled 2022-09-07 (×3): qty 2

## 2022-09-07 MED ORDER — SODIUM CHLORIDE 0.9 % IV SOLN
INTRAVENOUS | Status: DC
  Administered 2022-09-07: 75 mL/h via INTRAVENOUS

## 2022-09-07 NOTE — Plan of Care (Signed)
  Problem: Education: Goal: Knowledge of disease or condition will improve Outcome: Progressing Goal: Understanding of medication regimen will improve Outcome: Progressing   Problem: Activity: Goal: Ability to tolerate increased activity will improve Outcome: Progressing   Problem: Health Behavior/Discharge Planning: Goal: Ability to safely manage health-related needs after discharge will improve Outcome: Progressing   Problem: Education: Goal: Expressions of having a comfortable level of knowledge regarding the disease process will increase Outcome: Progressing   Problem: Self-Concept: Goal: Level of anxiety will decrease Outcome: Progressing   Problem: Health Behavior/Discharge Planning: Goal: Ability to manage health-related needs will improve Outcome: Progressing   Problem: Clinical Measurements: Goal: Will remain free from infection Outcome: Progressing Goal: Diagnostic test results will improve Outcome: Progressing

## 2022-09-07 NOTE — Progress Notes (Signed)
PROGRESS NOTE    Devon Hanson  BJY:[id number] DOB: [birth year] DOA: 09/06/2022 PCP: Sharon Seller, NP   Brief Narrative:  Devon Hanson is a 42 y.o. male with medical history significant of seizures, GERD, hypertension, anxiety, alcohol use with history of withdrawal and withdrawal seizure, presenting with abdominal pain, vomiting.  It appears patient had recently had binge drinking episode over the weekend with what appears to be subsequent pancreatitis which ultimately resulted in patient being unable to take his antiepileptic medications.  Patient had reported seizure at home prior to presentation.Hospitalist called for admission  Assessment & Plan:   Principal Problem:   Seizure (HCC) Active Problems:   Hypokalemia   GAD (generalized anxiety disorder)   GERD (gastroesophageal reflux disease)   Essential hypertension   Alcohol use disorder   Intractable nausea and vomiting   Elevated lipase   New onset atrial fibrillation (HCC)   Hyponatremia  Acute breakthrough seizure secondary to intractable nausea/vomiting -Seizure secondary to medication noncompliance, unable to keep medication down the setting of nausea vomiting -Patient back on home medication(Keppra) transition back to p.o. at this time, no further episodes of seizure/postictal state has cleared -Continue supportive care as below for intractable nausea vomiting abdominal pain    Presumed pancreatitis, alcohol induced, POA Intractable nausea, vomiting, abdominal pain -CT unremarkable but lipase elevated at 142 -Symptoms classic for pancreatitis given abdominal pain bandlike fashion with intractable nausea and vomiting. -Lengthy discussion about alcohol cessation; patient reports approximately 3 drinks per day, more on the weekends - QTc noted to be prolonged, so hold off on further antiemetics for now, will be having Ativan available   Acute provoked versus new onset atrial fibrillation, resolved -Transient  episode of irregular tachycardia in the ED not caught on EKG or telemetry, presumed to be A-fib -Likely provoked secondary to above, will hold off on further anticoagulation, follow-up with PCP and cardiology for outpatient monitor to evaluate for ongoing dysrhythmia -Echocardiogram with EF 70/75%, hyperdynamic without wall motion abnormalities. - Continue home metoprolol - patient remains in sinus tach today   Hyponatremia Hypokalemia -Secondary to above continue to replete as appropriate, labs improving with IV fluids, increase p.o. intake and supplementation.   Alcohol use -Drinks upwards of 3 drinks per day, occasionally more on the weekend -Patient does carry a history of alcohol withdrawal seizure but notes he has markedly decreased his alcohol intake since that time -CIWA protocol, folate thiamine multivitamin per alcohol withdrawal protocol -given patient's reported alcohol intake withdrawals are unlikely  Hypertension -Blood pressure moderately well-controlled, patient remains tachycardic as above -Likely resume amlodipine at discharge pending vitals over the next 24 hours   GERD - Continue home PPI   Anxiety - Continue home Lexapro  DVT prophylaxis: Early ambulation Code Status: Full Family Communication: None present  Status is: Observation  Dispo: The patient is from: Home              Anticipated d/c is to: Home Home              Anticipated d/c date is: 24 to 48 hours not              Patient currently NOT medically stable for discharge  Consultants:  None  Procedures:  None  Antimicrobials:  None  Subjective: No acute issues or events overnight tolerating p.o. quite well this morning advancing diet as tolerated denies any further nausea vomiting abdominal pain.  Otherwise denies headache fevers chills or chest pain  Objective: Vitals:  09/06/22 1941 09/06/22 2334 09/07/22 0344 09/07/22 0500  BP: 134/79 (!) 140/73 (!) 141/97   Pulse: (!) 110 100 (!)  108   Resp: 19 18 18    Temp: 98.6 F (37 C) 98.9 F (37.2 C) 98.8 F (37.1 C)   TempSrc: Oral Oral Oral   SpO2: 100% 100% 100%   Weight:    84.9 kg  Height:        Intake/Output Summary (Last 24 hours) at 09/07/2022 0749 Last data filed at 09/07/2022 0340 Gross per 24 hour  Intake 0 ml  Output 2250 ml  Net -2250 ml   Filed Weights   09/06/22 0649 09/07/22 0500  Weight: 92.1 kg 84.9 kg    Examination:  General:  Pleasantly resting in bed, No acute distress. HEENT:  Normocephalic atraumatic.  Sclerae nonicteric, noninjected.  Extraocular movements intact bilaterally. Neck:  Without mass or deformity.  Trachea is midline. Lungs:  Clear to auscultate bilaterally without rhonchi, wheeze, or rales. Heart:  Regular rate and rhythm.  Without murmurs, rubs, or gallops. Abdomen:  Soft, diffusely tender minimally without rebound or guarding. Extremities: Without cyanosis, clubbing, edema, or obvious deformity. Skin:  Warm and dry, no erythema.  Data Reviewed: I have personally reviewed following labs and imaging studies  CBC: Recent Labs  Lab 08/31/22 1430 09/06/22 0710 09/07/22 0322  WBC 2.8* 3.0* 3.7*  HGB 16.3 13.0 13.0  HCT 45.7 35.6* 35.1*  MCV 83.1 80.9 83.6  PLT 107* 75* 72*   Basic Metabolic Panel: Recent Labs  Lab 08/31/22 1430 09/06/22 0710 09/06/22 1614 09/06/22 2330 09/07/22 0322  NA 130* 124* 126* 128* 130*  K 3.3* 2.5* 2.6* 3.0* 2.8*  CL 92* 87* 91* 92* 97*  CO2 18* 21* 22 22 23   GLUCOSE 133* 118* 103* 127* 107*  BUN 12 8 5* <5* <5*  CREATININE 0.98 1.11 1.15 1.16 1.00  CALCIUM 9.0 8.2* 8.1* 8.3* 8.2*  MG  --  1.1*  --   --   --    GFR: Estimated Creatinine Clearance: 105.2 mL/min (by C-G formula based on SCr of 1 mg/dL). Liver Function Tests: Recent Labs  Lab 08/31/22 1430 09/06/22 0710  AST 154* 331*  ALT 71* 97*  ALKPHOS 100 114  BILITOT 1.3* 1.1  PROT 8.5* 6.5  ALBUMIN 4.2 3.2*   Recent Labs  Lab 08/31/22 1430 09/06/22 0710   LIPASE 81* 142*   No results for input(s): "AMMONIA" in the last 168 hours. Coagulation Profile: No results for input(s): "INR", "PROTIME" in the last 168 hours. Cardiac Enzymes: No results for input(s): "CKTOTAL", "CKMB", "CKMBINDEX", "TROPONINI" in the last 168 hours. BNP (last 3 results) No results for input(s): "PROBNP" in the last 8760 hours. HbA1C: No results for input(s): "HGBA1C" in the last 72 hours. CBG: No results for input(s): "GLUCAP" in the last 168 hours. Lipid Profile: No results for input(s): "CHOL", "HDL", "LDLCALC", "TRIG", "CHOLHDL", "LDLDIRECT" in the last 72 hours. Thyroid Function Tests: No results for input(s): "TSH", "T4TOTAL", "FREET4", "T3FREE", "THYROIDAB" in the last 72 hours. Anemia Panel: No results for input(s): "VITAMINB12", "FOLATE", "FERRITIN", "TIBC", "IRON", "RETICCTPCT" in the last 72 hours. Sepsis Labs: No results for input(s): "PROCALCITON", "LATICACIDVEN" in the last 168 hours.  No results found for this or any previous visit (from the past 240 hour(s)).       Radiology Studies: ECHOCARDIOGRAM COMPLETE  Result Date: 09/06/2022    ECHOCARDIOGRAM REPORT   Patient Name:   KALVYN DESA Date of Exam: 09/06/2022 Medical Rec #:  [id number]        Height:       68.0 in Accession #:    [id number]       Weight:       203.0 lb Date of Birth:  1980-06-04        BSA:          2.057 m Patient Age:    41 years         BP:           136/86 mmHg Patient Gender: M                HR:           106 bpm. Exam Location:  Inpatient Procedure: 2D Echo, Color Doppler and Cardiac Doppler Indications:    Atrial Fibrillation  History:        Patient has prior history of Echocardiogram examinations, most                 recent 05/19/2017. Arrythmias:Atrial Fibrillation; Risk                 Factors:Hypertension and Hypocalcemia, Hypokalemia.  Sonographer:    Raeford Razor Referring Phys: [id number] Cecille Po MELVIN IMPRESSIONS  1. Left ventricular ejection fraction, by  estimation, is 70 to 75%. The left ventricle has hyperdynamic function. The left ventricle has no regional wall motion abnormalities. There is moderate asymmetric left ventricular hypertrophy of the basal-septal  segment. Left ventricular diastolic parameters were normal.  2. Right ventricular systolic function is normal. The right ventricular size is normal. Tricuspid regurgitation signal is inadequate for assessing PA pressure.  3. The mitral valve is normal in structure. Trivial mitral valve regurgitation.  4. The aortic valve was not well visualized. Aortic valve regurgitation is not visualized. No aortic stenosis is present.  5. Aortic dilatation noted. There is dilatation of the ascending aorta, measuring 40 mm. FINDINGS  Left Ventricle: Left ventricular ejection fraction, by estimation, is 70 to 75%. The left ventricle has hyperdynamic function. The left ventricle has no regional wall motion abnormalities. The left ventricular internal cavity size was normal in size. There is moderate asymmetric left ventricular hypertrophy of the basal-septal segment. Left ventricular diastolic parameters were normal. Right Ventricle: The right ventricular size is normal. No increase in right ventricular wall thickness. Right ventricular systolic function is normal. Tricuspid regurgitation signal is inadequate for assessing PA pressure. Left Atrium: Left atrial size was normal in size. Right Atrium: Right atrial size was normal in size. Pericardium: There is no evidence of pericardial effusion. Mitral Valve: The mitral valve is normal in structure. Trivial mitral valve regurgitation. Tricuspid Valve: The tricuspid valve is normal in structure. Tricuspid valve regurgitation is trivial. Aortic Valve: The aortic valve was not well visualized. Aortic valve regurgitation is not visualized. No aortic stenosis is present. Aortic valve peak gradient measures 7.2 mmHg. Pulmonic Valve: The pulmonic valve was not well visualized.  Pulmonic valve regurgitation is not visualized. Aorta: The aortic root is normal in size and structure and aortic dilatation noted. There is dilatation of the ascending aorta, measuring 40 mm. IAS/Shunts: The interatrial septum was not well visualized.  LEFT VENTRICLE PLAX 2D LVIDd:         3.90 cm   Diastology LVIDs:         2.40 cm   LV e' medial:    8.59 cm/s LV PW:         1.00 cm   LV E/e' medial:  7.4 LV IVS:        1.10 cm   LV e' lateral:   11.90 cm/s LVOT diam:     2.30 cm   LV E/e' lateral: 5.3 LV SV:         83 LV SV Index:   40 LVOT Area:     4.15 cm  RIGHT VENTRICLE             IVC RV Basal diam:  2.40 cm     IVC diam: 1.50 cm RV S prime:     34.23 cm/s TAPSE (M-mode): 2.2 cm LEFT ATRIUM           Index        RIGHT ATRIUM          Index LA diam:      3.10 cm 1.51 cm/m   RA Area:     9.79 cm LA Vol (A2C): 43.7 ml 21.25 ml/m  RA Volume:   17.00 ml 8.27 ml/m LA Vol (A4C): 15.9 ml 7.73 ml/m  AORTIC VALVE AV Area (Vmax): 4.26 cm AV Vmax:        134.00 cm/s AV Peak Grad:   7.2 mmHg LVOT Vmax:      137.33 cm/s LVOT Vmean:     91.867 cm/s LVOT VTI:       0.199 m  AORTA Ao Root diam: 3.60 cm Ao Asc diam:  4.00 cm MITRAL VALVE MV Area (PHT): 4.21 cm    SHUNTS MV Decel Time: 180 msec    Systemic VTI:  0.20 m MV E velocity: 63.53 cm/s  Systemic Diam: 2.30 cm MV A velocity: 76.90 cm/s MV E/A ratio:  0.83 Devon Lesches MD Electronically signed by Devon Lesches MD Signature Date/Time: 09/06/2022/7:10:49 PM    Final    CT ABDOMEN PELVIS W CONTRAST  Result Date: 09/06/2022 CLINICAL DATA:  Abdominal pain, acute, nonlocalized EXAM: CT ABDOMEN AND PELVIS WITH CONTRAST TECHNIQUE: Multidetector CT imaging of the abdomen and pelvis was performed using the standard protocol following bolus administration of intravenous contrast. RADIATION DOSE REDUCTION: This exam was performed according to the departmental dose-optimization program which includes automated exposure control, adjustment of the mA  and/or kV according to patient size and/or use of iterative reconstruction technique. CONTRAST:  75mL OMNIPAQUE IOHEXOL 350 MG/ML SOLN COMPARISON:  CT scan abdomen and pelvis from 10/21/2021. FINDINGS: Lower chest: There are dependent changes in the right lung base. Bilateral imaged lungs otherwise clear. No overt consolidation. No pleural effusion. The heart is normal in size. No pericardial effusion. Hepatobiliary: The liver is normal in size. Non-cirrhotic configuration. No suspicious mass. These is heterogeneous moderate-to-marked diffuse hepatic steatosis. No intrahepatic or extrahepatic bile duct dilation. No calcified gallstones. Normal gallbladder wall thickness. No pericholecystic inflammatory changes. Pancreas: Unremarkable. No pancreatic ductal dilatation or surrounding inflammatory changes. Spleen: Within normal limits. No focal lesion. Adrenals/Urinary Tract: Adrenal glands are unremarkable. No suspicious renal mass. No hydronephrosis. No renal or ureteric calculi. Unremarkable urinary bladder. Stomach/Bowel: There is a small sliding hiatal hernia. No disproportionate dilation of the small or large bowel loops. No evidence of abnormal bowel wall thickening or inflammatory changes. The appendix is unremarkable. Vascular/Lymphatic: No ascites or pneumoperitoneum. No abdominal or pelvic lymphadenopathy, by size criteria. No aneurysmal dilation of the major abdominal arteries. Reproductive: Normal size prostate. Redemonstration of asymmetrically mildly enlarged right seminal vesicle in comparison to the left. However, no focal mass or surrounding fat stranding noted. Findings are similar to the prior study and of minimal  clinical significance. Other: The visualized soft tissues and abdominal wall are unremarkable. Musculoskeletal: No suspicious osseous lesions. There is mild anterior wedging deformity of T10 through T12 vertebral bodies, similar to the prior study. No significant retropulsion or spinal  canal compromise. L1 superior endplate Schmorl's node noted. IMPRESSION: 1. No acute inflammatory process identified within the abdomen or pelvis. 2. Mild anterior wedging deformity of T10 through T12 vertebral bodies, similar to the prior study. No significant retropulsion or spinal canal compromise. 3. Multiple other nonacute observations, as described above. Electronically Signed   By: Jules Schick M.D.   On: 09/06/2022 11:24        Scheduled Meds:  enoxaparin (LOVENOX) injection  1 mg/kg Subcutaneous Q12H   escitalopram  10 mg Oral Daily   folic acid  1 mg Oral Daily   levETIRAcetam  750 mg Oral BID   metoprolol succinate  50 mg Oral Daily   multivitamin with minerals  1 tablet Oral Daily   pantoprazole  40 mg Oral Daily   prochlorperazine  10 mg Intravenous Once   sodium chloride flush  3 mL Intravenous Q12H   thiamine  100 mg Oral Daily   Or   thiamine  100 mg Intravenous Daily   Continuous Infusions:   LOS: 0 days   Time spent:  Azucena Fallen, DO [facility]  If 7PM-7AM, please contact night-coverage [email or web address]  09/07/2022, 7:49 AM

## 2022-09-07 NOTE — Plan of Care (Signed)
  Problem: Activity: Goal: Ability to tolerate increased activity will improve Outcome: Progressing   Problem: Self-Concept: Goal: Level of anxiety will decrease Outcome: Progressing

## 2022-09-08 LAB — COMPREHENSIVE METABOLIC PANEL
ALT: 78 U/L — ABNORMAL HIGH (ref 0–44)
AST: 153 U/L — ABNORMAL HIGH (ref 15–41)
Albumin: 2.8 g/dL — ABNORMAL LOW (ref 3.5–5.0)
Alkaline Phosphatase: 92 U/L (ref 38–126)
Anion gap: 7 (ref 5–15)
BUN: <5 mg/dL — ABNORMAL LOW (ref 6–20)
CO2: 25 mmol/L (ref 22–32)
Calcium: 8 mg/dL — ABNORMAL LOW (ref 8.9–10.3)
Chloride: 97 mmol/L — ABNORMAL LOW (ref 98–111)
Creatinine, Ser: 1.06 mg/dL (ref 0.61–1.24)
GFR, Estimated: >60 mL/min (ref >60)
Glucose, Bld: 88 mg/dL (ref 70–99)
Potassium: 3 mmol/L — ABNORMAL LOW (ref 3.5–5.1)
Sodium: 129 mmol/L — ABNORMAL LOW (ref 135–145)
Total Bilirubin: 0.6 mg/dL (ref 0.3–1.2)
Total Protein: 5.6 g/dL — ABNORMAL LOW (ref 6.5–8.1)

## 2022-09-08 LAB — PHOSPHORUS: Phosphorus: 2.4 mg/dL — ABNORMAL LOW (ref 2.5–4.6)

## 2022-09-08 LAB — CBC
HCT: 30.8 % — ABNORMAL LOW (ref 39.0–52.0)
Hemoglobin: 11.1 g/dL — ABNORMAL LOW (ref 13.0–17.0)
MCH: 31.1 pg (ref 26.0–34.0)
MCHC: 36 g/dL (ref 30.0–36.0)
MCV: 86.3 fL (ref 80.0–100.0)
Platelets: 86 10*3/uL — ABNORMAL LOW (ref 150–400)
RBC: 3.57 MIL/uL — ABNORMAL LOW (ref 4.22–5.81)
RDW: 13 % (ref 11.5–15.5)
WBC: 4 10*3/uL (ref 4.0–10.5)
nRBC: 0 % (ref 0.0–0.2)

## 2022-09-08 MED ORDER — FOLIC ACID 1 MG PO TABS: 1.0000 mg | ORAL_TABLET | Freq: Every day | ORAL | 0 refills | Status: DC

## 2022-09-08 MED ORDER — K PHOS MONO-SOD PHOS DI & MONO 155-852-130 MG PO TABS: 500.0000 mg | ORAL_TABLET | Freq: Four times a day (QID) | ORAL | 0 refills | Status: DC

## 2022-09-08 MED ORDER — VITAMIN B-1 100 MG PO TABS: 100.0000 mg | ORAL_TABLET | Freq: Every day | ORAL | 0 refills | Status: DC

## 2022-09-08 MED ORDER — POTASSIUM CHLORIDE CRYS ER 20 MEQ PO TBCR
40.0000 meq | EXTENDED_RELEASE_TABLET | ORAL | Status: DC
  Administered 2022-09-08: 40 meq via ORAL
  Filled 2022-09-08: qty 2

## 2022-09-08 MED ORDER — K PHOS MONO-SOD PHOS DI & MONO 155-852-130 MG PO TABS
500.0000 mg | ORAL_TABLET | Freq: Four times a day (QID) | ORAL | Status: DC
  Administered 2022-09-08: 500 mg via ORAL
  Filled 2022-09-08: qty 2

## 2022-09-08 NOTE — Discharge Summary (Signed)
Physician Discharge Summary  Ah Bott ZOX:[id number] DOB: 01-05-1981 DOA: 09/06/2022  PCP: Sharon Seller, NP  Admit date: 09/06/2022 Discharge date: 09/08/2022  Admitted From: Home Disposition: Home  Recommendations for Outpatient Follow-up:  Follow up with PCP in 1-2 weeks  Home Health: None Equipment/Devices: None  Discharge Condition: Stable CODE STATUS: Full Diet recommendation: Low-salt low-fat diet  Brief/Interim Summary: Devon Hanson is a 42 y.o. male with medical history significant of seizures, GERD, hypertension, anxiety, alcohol use with history of withdrawal and withdrawal seizure, presenting with abdominal pain, vomiting.  It appears patient had recently had binge drinking episode over the weekend with what appears to be subsequent pancreatitis which ultimately resulted in patient being unable to take his antiepileptic medications.  Patient had reported seizure at home prior to presentation.Hospitalist called for admission   Patient admitted as above with acute breakthrough seizure in the setting of intractable nausea vomiting abdominal pain likely due to alcoholic pancreatitis.  Patient was unable to tolerate p.o. medications and ultimately resulting in acute breakthrough seizure.  Now the patient's symptoms have been well-controlled back on his regular seizure regimen he is otherwise stable and agreeable for discharge home.  Of note patient did have what appears to be an acute provoked episode of A-fib transiently in the ED that was seen on monitor briefly but not recorded.  Echo show 70-75% EF without wall motion abnormalities and otherwise no further episodes of dysrhythmia during hospitalization.  Close follow-up with PCP to further evaluate near future certainly reasonable.  Discharge Diagnoses:  Principal Problem:   Seizure (HCC) Active Problems:   Hypokalemia   GAD (generalized anxiety disorder)   GERD (gastroesophageal reflux disease)   Essential  hypertension   Alcohol use disorder   Intractable nausea and vomiting   Elevated lipase   New onset atrial fibrillation (HCC)   Hyponatremia    Discharge Instructions  Discharge Instructions     Amb referral to [facility]   Complete by: As directed    Discharge patient   Complete by: As directed    Discharge disposition: 01-Home or Self Care   Discharge patient date: 09/08/2022      Allergies as of 09/08/2022   No Known Allergies      Medication List     TAKE these medications    amLODipine 10 MG tablet Commonly known as: NORVASC Take 1 tablet (10 mg total) by mouth daily.   escitalopram 10 MG tablet Commonly known as: LEXAPRO Take 1 tablet (10 mg total) by mouth daily. APPOINTMENT OVERDUE   folic acid 1 MG tablet Commonly known as: FOLVITE Take 1 tablet (1 mg total) by mouth daily.   levETIRAcetam 750 MG tablet Commonly known as: KEPPRA Take 1 tablet (750 mg total) by mouth 2 (two) times daily.   magnesium oxide 400 (240 Mg) MG tablet Commonly known as: MAG-OX Take 1 tablet (400 mg total) by mouth daily.   metoprolol succinate 50 MG 24 hr tablet Commonly known as: TOPROL-XL TAKE 1 TABLET BY MOUTH EVERY DAY   multivitamin tablet Take 1 tablet by mouth daily.   pantoprazole 40 MG tablet Commonly known as: PROTONIX TAKE 1 TABLET BY MOUTH EVERY DAY   phosphorus 155-852-130 MG tablet Commonly known as: K PHOS NEUTRAL Take 2 tablets (500 mg total) by mouth 4 (four) times daily.   thiamine 100 MG tablet Commonly known as: Vitamin B-1 Take 1 tablet (100 mg total) by mouth daily.        No Known Allergies  Consultations: None  Procedures/Studies: ECHOCARDIOGRAM COMPLETE  Result Date: 09/06/2022    ECHOCARDIOGRAM REPORT   Patient Name:   Devon Hanson Date of Exam: 09/06/2022 Medical Rec #:  [id number]        Height:       68.0 in Accession #:    [id number]       Weight:       203.0 lb Date of Birth:  1980/08/11        BSA:          2.057 m  Patient Age:    41 years         BP:           136/86 mmHg Patient Gender: M                HR:           106 bpm. Exam Location:  Inpatient Procedure: 2D Echo, Color Doppler and Cardiac Doppler Indications:    Atrial Fibrillation  History:        Patient has prior history of Echocardiogram examinations, most                 recent 05/19/2017. Arrythmias:Atrial Fibrillation; Risk                 Factors:Hypertension and Hypocalcemia, Hypokalemia.  Sonographer:    Raeford Razor Referring Phys: [id number] Cecille Po MELVIN IMPRESSIONS  1. Left ventricular ejection fraction, by estimation, is 70 to 75%. The left ventricle has hyperdynamic function. The left ventricle has no regional wall motion abnormalities. There is moderate asymmetric left ventricular hypertrophy of the basal-septal  segment. Left ventricular diastolic parameters were normal.  2. Right ventricular systolic function is normal. The right ventricular size is normal. Tricuspid regurgitation signal is inadequate for assessing PA pressure.  3. The mitral valve is normal in structure. Trivial mitral valve regurgitation.  4. The aortic valve was not well visualized. Aortic valve regurgitation is not visualized. No aortic stenosis is present.  5. Aortic dilatation noted. There is dilatation of the ascending aorta, measuring 40 mm. FINDINGS  Left Ventricle: Left ventricular ejection fraction, by estimation, is 70 to 75%. The left ventricle has hyperdynamic function. The left ventricle has no regional wall motion abnormalities. The left ventricular internal cavity size was normal in size. There is moderate asymmetric left ventricular hypertrophy of the basal-septal segment. Left ventricular diastolic parameters were normal. Right Ventricle: The right ventricular size is normal. No increase in right ventricular wall thickness. Right ventricular systolic function is normal. Tricuspid regurgitation signal is inadequate for assessing PA pressure. Left Atrium: Left atrial  size was normal in size. Right Atrium: Right atrial size was normal in size. Pericardium: There is no evidence of pericardial effusion. Mitral Valve: The mitral valve is normal in structure. Trivial mitral valve regurgitation. Tricuspid Valve: The tricuspid valve is normal in structure. Tricuspid valve regurgitation is trivial. Aortic Valve: The aortic valve was not well visualized. Aortic valve regurgitation is not visualized. No aortic stenosis is present. Aortic valve peak gradient measures 7.2 mmHg. Pulmonic Valve: The pulmonic valve was not well visualized. Pulmonic valve regurgitation is not visualized. Aorta: The aortic root is normal in size and structure and aortic dilatation noted. There is dilatation of the ascending aorta, measuring 40 mm. IAS/Shunts: The interatrial septum was not well visualized.  LEFT VENTRICLE PLAX 2D LVIDd:         3.90 cm   Diastology LVIDs:  2.40 cm   LV e' medial:    8.59 cm/s LV PW:         1.00 cm   LV E/e' medial:  7.4 LV IVS:        1.10 cm   LV e' lateral:   11.90 cm/s LVOT diam:     2.30 cm   LV E/e' lateral: 5.3 LV SV:         83 LV SV Index:   40 LVOT Area:     4.15 cm  RIGHT VENTRICLE             IVC RV Basal diam:  2.40 cm     IVC diam: 1.50 cm RV S prime:     34.23 cm/s TAPSE (M-mode): 2.2 cm LEFT ATRIUM           Index        RIGHT ATRIUM          Index LA diam:      3.10 cm 1.51 cm/m   RA Area:     9.79 cm LA Vol (A2C): 43.7 ml 21.25 ml/m  RA Volume:   17.00 ml 8.27 ml/m LA Vol (A4C): 15.9 ml 7.73 ml/m  AORTIC VALVE AV Area (Vmax): 4.26 cm AV Vmax:        134.00 cm/s AV Peak Grad:   7.2 mmHg LVOT Vmax:      137.33 cm/s LVOT Vmean:     91.867 cm/s LVOT VTI:       0.199 m  AORTA Ao Root diam: 3.60 cm Ao Asc diam:  4.00 cm MITRAL VALVE MV Area (PHT): 4.21 cm    SHUNTS MV Decel Time: 180 msec    Systemic VTI:  0.20 m MV E velocity: 63.53 cm/s  Systemic Diam: 2.30 cm MV A velocity: 76.90 cm/s MV E/A ratio:  0.83 Epifanio Lesches MD Electronically signed  by Epifanio Lesches MD Signature Date/Time: 09/06/2022/7:10:49 PM    Final    CT ABDOMEN PELVIS W CONTRAST  Result Date: 09/06/2022 CLINICAL DATA:  Abdominal pain, acute, nonlocalized EXAM: CT ABDOMEN AND PELVIS WITH CONTRAST TECHNIQUE: Multidetector CT imaging of the abdomen and pelvis was performed using the standard protocol following bolus administration of intravenous contrast. RADIATION DOSE REDUCTION: This exam was performed according to the departmental dose-optimization program which includes automated exposure control, adjustment of the mA and/or kV according to patient size and/or use of iterative reconstruction technique. CONTRAST:  75mL OMNIPAQUE IOHEXOL 350 MG/ML SOLN COMPARISON:  CT scan abdomen and pelvis from 10/21/2021. FINDINGS: Lower chest: There are dependent changes in the right lung base. Bilateral imaged lungs otherwise clear. No overt consolidation. No pleural effusion. The heart is normal in size. No pericardial effusion. Hepatobiliary: The liver is normal in size. Non-cirrhotic configuration. No suspicious mass. These is heterogeneous moderate-to-marked diffuse hepatic steatosis. No intrahepatic or extrahepatic bile duct dilation. No calcified gallstones. Normal gallbladder wall thickness. No pericholecystic inflammatory changes. Pancreas: Unremarkable. No pancreatic ductal dilatation or surrounding inflammatory changes. Spleen: Within normal limits. No focal lesion. Adrenals/Urinary Tract: Adrenal glands are unremarkable. No suspicious renal mass. No hydronephrosis. No renal or ureteric calculi. Unremarkable urinary bladder. Stomach/Bowel: There is a small sliding hiatal hernia. No disproportionate dilation of the small or large bowel loops. No evidence of abnormal bowel wall thickening or inflammatory changes. The appendix is unremarkable. Vascular/Lymphatic: No ascites or pneumoperitoneum. No abdominal or pelvic lymphadenopathy, by size criteria. No aneurysmal dilation of the  major abdominal arteries. Reproductive: Normal size prostate. Redemonstration  of asymmetrically mildly enlarged right seminal vesicle in comparison to the left. However, no focal mass or surrounding fat stranding noted. Findings are similar to the prior study and of minimal clinical significance. Other: The visualized soft tissues and abdominal wall are unremarkable. Musculoskeletal: No suspicious osseous lesions. There is mild anterior wedging deformity of T10 through T12 vertebral bodies, similar to the prior study. No significant retropulsion or spinal canal compromise. L1 superior endplate Schmorl's node noted. IMPRESSION: 1. No acute inflammatory process identified within the abdomen or pelvis. 2. Mild anterior wedging deformity of T10 through T12 vertebral bodies, similar to the prior study. No significant retropulsion or spinal canal compromise. 3. Multiple other nonacute observations, as described above. Electronically Signed   By: Jules Schick M.D.   On: 09/06/2022 11:24     Subjective: No acute issues or events overnight   Discharge Exam: Vitals:   09/08/22 0426 09/08/22 0836  BP: 114/86 (!) 136/90  Pulse: 88 77  Resp: 18 18  Temp: 98.2 F (36.8 C) 98.5 F (36.9 C)  SpO2: 100% 100%   Vitals:   09/07/22 2025 09/07/22 2349 09/08/22 0426 09/08/22 0836  BP: 121/80 127/86 114/86 (!) 136/90  Pulse: 93 88 88 77  Resp: 18 18 18 18   Temp: 99.2 F (37.3 C) 98.8 F (37.1 C) 98.2 F (36.8 C) 98.5 F (36.9 C)  TempSrc: Oral Oral Oral Oral  SpO2: 100% 100% 100% 100%  Weight:      Height:        General: Pt is alert, awake, not in acute distress Cardiovascular: RRR, S1/S2 +, no rubs, no gallops Respiratory: CTA bilaterally, no wheezing, no rhonchi Abdominal: Soft, NT, ND, bowel sounds + Extremities: no edema, no cyanosis    The results of significant diagnostics from this hospitalization (including imaging, microbiology, ancillary and laboratory) are listed below for reference.      Microbiology: No results found for this or any previous visit (from the past 240 hour(s)).   Labs: BNP (last 3 results) No results for input(s): "BNP" in the last 8760 hours. Basic Metabolic Panel: Recent Labs  Lab 09/06/22 0710 09/06/22 1614 09/06/22 2330 09/07/22 0322 09/07/22 0756 09/08/22 0040  NA 124* 126* 128* 130* 130* 129*  K 2.5* 2.6* 3.0* 2.8* 2.6* 3.0*  CL 87* 91* 92* 97* 95* 97*  CO2 21* 22 22 23 24 25   GLUCOSE 118* 103* 127* 107* 95 88  BUN 8 5* <5* <5* <5* <5*  CREATININE 1.11 1.15 1.16 1.00 0.91 1.06  CALCIUM 8.2* 8.1* 8.3* 8.2* 8.1* 8.0*  MG 1.1*  --   --   --  2.1  --   PHOS  --   --   --   --   --  2.4*   Liver Function Tests: Recent Labs  Lab 09/06/22 0710 09/08/22 0040  AST 331* 153*  ALT 97* 78*  ALKPHOS 114 92  BILITOT 1.1 0.6  PROT 6.5 5.6*  ALBUMIN 3.2* 2.8*   Recent Labs  Lab 09/06/22 0710  LIPASE 142*   No results for input(s): "AMMONIA" in the last 168 hours. CBC: Recent Labs  Lab 09/06/22 0710 09/07/22 0322 09/08/22 0040  WBC 3.0* 3.7* 4.0  HGB 13.0 13.0 11.1*  HCT 35.6* 35.1* 30.8*  MCV 80.9 83.6 86.3  PLT 75* 72* 86*    Urinalysis    Component Value Date/Time   COLORURINE YELLOW 09/06/2022 0659   APPEARANCEUR CLEAR 09/06/2022 0659   LABSPEC 1.008 09/06/2022 0659   PHURINE  6.0 09/06/2022 0659   GLUCOSEU NEGATIVE 09/06/2022 0659   HGBUR SMALL (A) 09/06/2022 0659   BILIRUBINUR NEGATIVE 09/06/2022 0659   KETONESUR NEGATIVE 09/06/2022 0659   PROTEINUR NEGATIVE 09/06/2022 0659   NITRITE NEGATIVE 09/06/2022 0659   LEUKOCYTESUR NEGATIVE 09/06/2022 0659   Sepsis Labs Recent Labs  Lab 09/06/22 0710 09/07/22 0322 09/08/22 0040  WBC 3.0* 3.7* 4.0   Microbiology No results found for this or any previous visit (from the past 240 hour(s)).   Time coordinating discharge: Over 30 minutes  SIGNED:   Azucena Fallen, DO [facility] 09/08/2022, 3:43 PM Pager   If 7PM-7AM, please contact  night-coverage [email or web address]

## 2022-09-08 NOTE — TOC Transition Note (Signed)
Transition of Care [facility]) - CM/SW Discharge Note   Patient Details  Name: Devon Hanson MRN: [id number] Date of Birth: May 13, 1980  [facility] CM/SW Contact:  Kermit Balo, RN Phone Number: 09/08/2022, 10:18 AM   Clinical Narrative:     Pt is discharging home with self care.  PCP: [facility]. Pt has transportation home.   Final next level of care: Home/Self Care Barriers to Discharge: Inadequate or no insurance, Barriers Unresolved (comment)   Patient Goals and CMS Choice      Discharge Placement                         Discharge Plan and Services Additional resources added to the After Visit Summary for                                       Social Determinants of Health (SDOH) Interventions SDOH Screenings   Food Insecurity: No Food Insecurity (09/06/2022)  Housing: Low Risk  (09/06/2022)  Transportation Needs: No Transportation Needs (09/06/2022)  Utilities: Not At Risk (09/06/2022)  Depression (PHQ2-9): Low Risk  (04/19/2022)  Social Connections: Unknown (06/17/2021)   Received from [facility], [facility]  Tobacco Use: Low Risk  (09/06/2022)     Readmission Risk Interventions    02/08/2022    2:19 PM  Readmission Risk Prevention Plan  Transportation Screening Complete  PCP or Specialist Appt within 5-7 Days Complete  Home Care Screening Complete  Medication Review (RN CM) Complete

## 2022-09-08 NOTE — Plan of Care (Signed)
  Problem: Education: Goal: Knowledge of disease or condition will improve Outcome: Adequate for Discharge Goal: Understanding of medication regimen will improve Outcome: Adequate for Discharge Goal: Individualized Educational Video(s) Outcome: Adequate for Discharge   Problem: Activity: Goal: Ability to tolerate increased activity will improve Outcome: Adequate for Discharge   Problem: Cardiac: Goal: Ability to achieve and maintain adequate cardiopulmonary perfusion will improve Outcome: Adequate for Discharge   Problem: Health Behavior/Discharge Planning: Goal: Ability to safely manage health-related needs after discharge will improve Outcome: Adequate for Discharge   Problem: Education: Goal: Expressions of having a comfortable level of knowledge regarding the disease process will increase Outcome: Adequate for Discharge   Problem: Coping: Goal: Ability to adjust to condition or change in health will improve Outcome: Adequate for Discharge Goal: Ability to identify appropriate support needs will improve Outcome: Adequate for Discharge   Problem: Health Behavior/Discharge Planning: Goal: Compliance with prescribed medication regimen will improve Outcome: Adequate for Discharge   Problem: Medication: Goal: Risk for medication side effects will decrease Outcome: Adequate for Discharge   Problem: Clinical Measurements: Goal: Complications related to the disease process, condition or treatment will be avoided or minimized Outcome: Adequate for Discharge Goal: Diagnostic test results will improve Outcome: Adequate for Discharge   Problem: Safety: Goal: Verbalization of understanding the information provided will improve Outcome: Adequate for Discharge   Problem: Self-Concept: Goal: Level of anxiety will decrease Outcome: Adequate for Discharge Goal: Ability to verbalize feelings about condition will improve Outcome: Adequate for Discharge   Problem:  Education: Goal: Knowledge of General Education information will improve Description: Including pain rating scale, medication(s)/side effects and non-pharmacologic comfort measures Outcome: Adequate for Discharge   Problem: Health Behavior/Discharge Planning: Goal: Ability to manage health-related needs will improve Outcome: Adequate for Discharge   Problem: Clinical Measurements: Goal: Ability to maintain clinical measurements within normal limits will improve Outcome: Adequate for Discharge Goal: Will remain free from infection Outcome: Adequate for Discharge Goal: Diagnostic test results will improve Outcome: Adequate for Discharge Goal: Respiratory complications will improve Outcome: Adequate for Discharge Goal: Cardiovascular complication will be avoided Outcome: Adequate for Discharge   Problem: Activity: Goal: Risk for activity intolerance will decrease Outcome: Adequate for Discharge   Problem: Nutrition: Goal: Adequate nutrition will be maintained Outcome: Adequate for Discharge   Problem: Coping: Goal: Level of anxiety will decrease Outcome: Adequate for Discharge   Problem: Elimination: Goal: Will not experience complications related to bowel motility Outcome: Adequate for Discharge Goal: Will not experience complications related to urinary retention Outcome: Adequate for Discharge   Problem: Pain Managment: Goal: General experience of comfort will improve Outcome: Adequate for Discharge   Problem: Safety: Goal: Ability to remain free from injury will improve Outcome: Adequate for Discharge   Problem: Skin Integrity: Goal: Risk for impaired skin integrity will decrease Outcome: Adequate for Discharge

## 2022-09-08 NOTE — Progress Notes (Signed)
Discharge instructions (including medications) discussed with and copy provided to patient/caregiver 

## 2022-09-09 ENCOUNTER — Telehealth: Payer: Self-pay

## 2022-09-09 NOTE — Patient Outreach (Signed)
error 

## 2022-09-17 ENCOUNTER — Other Ambulatory Visit: Payer: Self-pay | Admitting: Family

## 2022-09-17 DIAGNOSIS — I10 Essential (primary) hypertension: Secondary | ICD-10-CM

## 2022-09-20 ENCOUNTER — Ambulatory Visit: Payer: Self-pay | Admitting: Nurse Practitioner

## 2022-09-20 ENCOUNTER — Other Ambulatory Visit: Payer: Self-pay | Admitting: Family

## 2022-09-20 DIAGNOSIS — F411 Generalized anxiety disorder: Secondary | ICD-10-CM

## 2022-10-11 ENCOUNTER — Ambulatory Visit: Payer: Self-pay | Admitting: Nurse Practitioner

## 2022-10-28 ENCOUNTER — Other Ambulatory Visit: Payer: Self-pay

## 2022-10-28 ENCOUNTER — Encounter (HOSPITAL_COMMUNITY): Payer: Self-pay

## 2022-10-28 ENCOUNTER — Inpatient Hospital Stay (HOSPITAL_COMMUNITY)
Admission: EM | Admit: 2022-10-28 | Discharge: 2022-11-02 | DRG: 433 | Disposition: A | Discharge status: Home / Self Care | Payer: Self-pay | Attending: Family Medicine | Admitting: Family Medicine

## 2022-10-28 DIAGNOSIS — R6510 Systemic inflammatory response syndrome (SIRS) of non-infectious origin without acute organ dysfunction: Secondary | ICD-10-CM | POA: Diagnosis present

## 2022-10-28 DIAGNOSIS — K7010 Alcoholic hepatitis without ascites: Principal | ICD-10-CM | POA: Diagnosis present

## 2022-10-28 DIAGNOSIS — E781 Pure hyperglyceridemia: Secondary | ICD-10-CM | POA: Diagnosis present

## 2022-10-28 DIAGNOSIS — R748 Abnormal levels of other serum enzymes: Secondary | ICD-10-CM

## 2022-10-28 DIAGNOSIS — Z1152 Encounter for screening for COVID-19: Secondary | ICD-10-CM

## 2022-10-28 DIAGNOSIS — F10930 Alcohol use, unspecified with withdrawal, uncomplicated: Secondary | ICD-10-CM

## 2022-10-28 DIAGNOSIS — F1090 Alcohol use, unspecified, uncomplicated: Secondary | ICD-10-CM | POA: Diagnosis present

## 2022-10-28 DIAGNOSIS — Z79899 Other long term (current) drug therapy: Secondary | ICD-10-CM

## 2022-10-28 DIAGNOSIS — E871 Hypo-osmolality and hyponatremia: Secondary | ICD-10-CM | POA: Diagnosis present

## 2022-10-28 DIAGNOSIS — Z8679 Personal history of other diseases of the circulatory system: Secondary | ICD-10-CM

## 2022-10-28 DIAGNOSIS — R7401 Elevation of levels of liver transaminase levels: Secondary | ICD-10-CM | POA: Diagnosis present

## 2022-10-28 DIAGNOSIS — I480 Paroxysmal atrial fibrillation: Secondary | ICD-10-CM | POA: Diagnosis present

## 2022-10-28 DIAGNOSIS — I10 Essential (primary) hypertension: Secondary | ICD-10-CM | POA: Diagnosis present

## 2022-10-28 DIAGNOSIS — R569 Unspecified convulsions: Principal | ICD-10-CM

## 2022-10-28 DIAGNOSIS — E876 Hypokalemia: Secondary | ICD-10-CM | POA: Diagnosis present

## 2022-10-28 DIAGNOSIS — G40909 Epilepsy, unspecified, not intractable, without status epilepticus: Secondary | ICD-10-CM | POA: Diagnosis present

## 2022-10-28 DIAGNOSIS — F411 Generalized anxiety disorder: Secondary | ICD-10-CM | POA: Diagnosis present

## 2022-10-28 DIAGNOSIS — D696 Thrombocytopenia, unspecified: Secondary | ICD-10-CM

## 2022-10-28 DIAGNOSIS — D5912 Cold autoimmune hemolytic anemia: Secondary | ICD-10-CM

## 2022-10-28 DIAGNOSIS — D61818 Other pancytopenia: Secondary | ICD-10-CM | POA: Diagnosis present

## 2022-10-28 DIAGNOSIS — K219 Gastro-esophageal reflux disease without esophagitis: Secondary | ICD-10-CM | POA: Diagnosis present

## 2022-10-28 LAB — BASIC METABOLIC PANEL
Anion gap: 22 — ABNORMAL HIGH (ref 5–15)
BUN: 11 mg/dL (ref 6–20)
CO2: 21 mmol/L — ABNORMAL LOW (ref 22–32)
Calcium: 8.3 mg/dL — ABNORMAL LOW (ref 8.9–10.3)
Chloride: 93 mmol/L — ABNORMAL LOW (ref 98–111)
Creatinine, Ser: <0.3 mg/dL — ABNORMAL LOW (ref 0.61–1.24)
Glucose, Bld: 106 mg/dL — ABNORMAL HIGH (ref 70–99)
Potassium: 3.8 mmol/L (ref 3.5–5.1)
Sodium: 136 mmol/L (ref 135–145)

## 2022-10-28 MED ORDER — SODIUM CHLORIDE 0.9 % IV BOLUS
2000.0000 mL | Freq: Once | INTRAVENOUS | Status: AC
  Administered 2022-10-28: 2000 mL via INTRAVENOUS

## 2022-10-28 MED ORDER — LORAZEPAM 2 MG/ML IJ SOLN
1.0000 mg | Freq: Once | INTRAMUSCULAR | Status: AC
  Administered 2022-10-28: 1 mg via INTRAVENOUS
  Filled 2022-10-28: qty 1

## 2022-10-28 MED ORDER — LEVETIRACETAM IN NACL 1000 MG/100ML IV SOLN
1000.0000 mg | Freq: Once | INTRAVENOUS | Status: AC
  Administered 2022-10-28: 1000 mg via INTRAVENOUS
  Filled 2022-10-28: qty 100

## 2022-10-28 NOTE — ED Provider Notes (Signed)
[place] EMERGENCY DEPARTMENT AT [facility] Provider Note   CSN: [id number] Arrival date & time: 10/28/22  2026     History  Chief Complaint  Patient presents with   Seizures    Devon Hanson is a 42 y.o. male.  Patient reports he feels like he is going to have a seizure.  Patient reports that he has a past medical history of seizures.  Patient tells me that he took his morning dose of Keppra however he has not taken his evening dosage.  Patient states that he has seizures when he goes into alcohol withdrawal.  He reports he has been trying to decrease his alcohol consumption.  Patient denies any fever he denies any chills.  Patient denies chest or abdominal pain.  He reports that he has been having a lot of stress and is tired.  The history is provided by the patient. No language interpreter was used.  Seizures Initial focality:  None Severity:  Moderate Timing:  Once Recent head injury:  No recent head injuries      Home Medications Prior to Admission medications   Medication Sig Start Date End Date Taking? Authorizing Provider  amLODipine (NORVASC) 10 MG tablet TAKE 1 TABLET BY MOUTH EVERY DAY 09/17/22   Sharon Seller, NP  escitalopram (LEXAPRO) 10 MG tablet TAKE 1 TABLET (10 MG TOTAL) BY MOUTH DAILY. APPOINTMENT OVERDUE 09/20/22   Sharon Seller, NP  folic acid (FOLVITE) 1 MG tablet Take 1 tablet (1 mg total) by mouth daily. 09/08/22   Azucena Fallen, MD  levETIRAcetam (KEPPRA) 750 MG tablet Take 1 tablet (750 mg total) by mouth 2 (two) times daily. 05/12/22   Sater, Pearletha Furl, MD  magnesium oxide (MAG-OX) 400 (240 Mg) MG tablet Take 1 tablet (400 mg total) by mouth daily. 05/12/22   Sater, Pearletha Furl, MD  metoprolol succinate (TOPROL-XL) 50 MG 24 hr tablet TAKE 1 TABLET BY MOUTH EVERY DAY 06/03/22   Sharon Seller, NP  Multiple Vitamin (MULTIVITAMIN) tablet Take 1 tablet by mouth daily.    [provider]  pantoprazole (PROTONIX) 40 MG  tablet TAKE 1 TABLET BY MOUTH EVERY DAY 09/02/22   Sharon Seller, NP  phosphorus (K PHOS NEUTRAL) [phone] MG tablet Take 2 tablets (500 mg total) by mouth 4 (four) times daily. 09/08/22   Azucena Fallen, MD  thiamine (VITAMIN B-1) 100 MG tablet Take 1 tablet (100 mg total) by mouth daily. 09/08/22   Azucena Fallen, MD      Allergies    Patient has no known allergies.    Review of Systems   Review of Systems  Neurological:  Positive for seizures.  All other systems reviewed and are negative.   Physical Exam Updated Vital Signs BP (!) 137/101 (BP Location: Right Arm)   Pulse (!) 118   Temp 99.1 F (37.3 C) (Oral)   Resp 16   Ht 5\' 8"  (1.727 m)   Wt 78.9 kg   SpO2 97%   BMI 26.46 kg/m  Physical Exam Vitals and nursing note reviewed.  Constitutional:      Appearance: He is well-developed.  HENT:     Head: Normocephalic.     Mouth/Throat:     Mouth: Mucous membranes are moist.  Cardiovascular:     Rate and Rhythm: Normal rate.  Pulmonary:     Effort: Pulmonary effort is normal.  Abdominal:     General: There is no distension.  Musculoskeletal:  General: Normal range of motion.     Cervical back: Normal range of motion.  Skin:    General: Skin is warm.  Neurological:     Mental Status: He is alert and oriented to person, place, and time.  Psychiatric:        Judgment: Judgment normal.     ED Results / Procedures / Treatments   Labs (all labs ordered are listed, but only abnormal results are displayed) Labs Reviewed  CBC WITH DIFFERENTIAL/PLATELET  BASIC METABOLIC PANEL  LEVETIRACETAM LEVEL    EKG None  Radiology No results found.  Procedures Procedures    Medications Ordered in ED Medications  LORazepam (ATIVAN) injection 1 mg (1 mg Intravenous Given 10/28/22 2201)  levETIRAcetam (KEPPRA) IVPB 1000 mg/100 mL premix (0 mg Intravenous Stopped 10/28/22 2218)    ED Course/ Medical Decision Making/ A&P Clinical Course as of  10/28/22 2331  Thu Oct 28, 2022  2322 Creatinine(!): <0.30 [LS]  2322 Anion gap(!): 22 [LS]    Clinical Course User Index [LS] Elson Areas, PA-C                                 Medical Decision Making Patient complains of feeling like he is going to have a seizure  Amount and/or Complexity of Data Reviewed Labs: ordered. Decision-making details documented in ED Course.    Details: Labs ordered reviewed and interpreted  Risk Prescription drug management. Risk Details: Patient given Ativan 1 mg IV as well as 1 g of Keppra.           Final Clinical Impression(s) / ED Diagnoses Final diagnoses:  Seizure (HCC)  Alcohol withdrawal syndrome without complication (HCC)    Rx / DC Orders ED Discharge Orders     None      Pt's care turned over to [facility] PA.    Osie Cheeks 10/28/22 2332    Terald Sleeper, MD 10/29/22 2005

## 2022-10-28 NOTE — ED Triage Notes (Addendum)
Pt BIBGEMS from home. Pt states he feels like he's going to have a seizure. Pt take Keppra with no missed doses. ETOH use for the past 3 days, pt reports having 1/4 of fifth today. Pt reports no seizures this week. Pt c/o of HA that started today and n/v. Pt states that last seizure was in July of this year. Tachycardic w/ EMS   Hx of seizures.   20 LAC 300 NS  BP 136/96 P 98 O2 98% RA CBG 220

## 2022-10-28 NOTE — ED Notes (Signed)
Seizure pads placed on side rails

## 2022-10-29 ENCOUNTER — Emergency Department: Payer: Self-pay

## 2022-10-29 ENCOUNTER — Observation Stay (HOSPITAL_COMMUNITY): Payer: Self-pay

## 2022-10-29 ENCOUNTER — Emergency Department (HOSPITAL_COMMUNITY): Payer: Self-pay

## 2022-10-29 DIAGNOSIS — R7401 Elevation of levels of liver transaminase levels: Secondary | ICD-10-CM

## 2022-10-29 DIAGNOSIS — Z8679 Personal history of other diseases of the circulatory system: Secondary | ICD-10-CM

## 2022-10-29 DIAGNOSIS — D696 Thrombocytopenia, unspecified: Secondary | ICD-10-CM

## 2022-10-29 DIAGNOSIS — D5912 Cold autoimmune hemolytic anemia: Secondary | ICD-10-CM

## 2022-10-29 LAB — COMPREHENSIVE METABOLIC PANEL
ALT: 335 U/L — ABNORMAL HIGH (ref 0–44)
ALT: 354 U/L — ABNORMAL HIGH (ref 0–44)
AST: 716 U/L — ABNORMAL HIGH (ref 15–41)
AST: 901 U/L — ABNORMAL HIGH (ref 15–41)
Albumin: 2.5 g/dL — ABNORMAL LOW (ref 3.5–5.0)
Albumin: 3 g/dL — ABNORMAL LOW (ref 3.5–5.0)
Alkaline Phosphatase: 201 U/L — ABNORMAL HIGH (ref 38–126)
Alkaline Phosphatase: 244 U/L — ABNORMAL HIGH (ref 38–126)
Anion gap: 18 — ABNORMAL HIGH (ref 5–15)
Anion gap: 20 — ABNORMAL HIGH (ref 5–15)
BUN: 10 mg/dL (ref 6–20)
BUN: 9 mg/dL (ref 6–20)
CO2: 19 mmol/L — ABNORMAL LOW (ref 22–32)
CO2: 19 mmol/L — ABNORMAL LOW (ref 22–32)
Calcium: 7.2 mg/dL — ABNORMAL LOW (ref 8.9–10.3)
Calcium: 8.4 mg/dL — ABNORMAL LOW (ref 8.9–10.3)
Chloride: 87 mmol/L — ABNORMAL LOW (ref 98–111)
Chloride: 93 mmol/L — ABNORMAL LOW (ref 98–111)
Creatinine, Ser: <0.3 mg/dL — ABNORMAL LOW (ref 0.61–1.24)
Creatinine, Ser: 1.04 mg/dL (ref 0.61–1.24)
GFR, Estimated: >60 mL/min (ref >60)
Glucose, Bld: 83 mg/dL (ref 70–99)
Glucose, Bld: 88 mg/dL (ref 70–99)
Potassium: 3.2 mmol/L — ABNORMAL LOW (ref 3.5–5.1)
Potassium: 3.9 mmol/L (ref 3.5–5.1)
Sodium: 124 mmol/L — ABNORMAL LOW (ref 135–145)
Sodium: 132 mmol/L — ABNORMAL LOW (ref 135–145)
Total Bilirubin: 3.8 mg/dL — ABNORMAL HIGH (ref 0.3–1.2)
Total Bilirubin: 7 mg/dL — ABNORMAL HIGH (ref 0.3–1.2)
Total Protein: 6.3 g/dL — ABNORMAL LOW (ref 6.5–8.1)
Total Protein: 6.7 g/dL (ref 6.5–8.1)

## 2022-10-29 LAB — LACTATE DEHYDROGENASE: LDH: 542 U/L — ABNORMAL HIGH (ref 98–192)

## 2022-10-29 LAB — DIC (DISSEMINATED INTRAVASCULAR COAGULATION)PANEL
Fibrinogen: 118 mg/dL — ABNORMAL LOW (ref 210–475)
INR: 1.2 (ref 0.8–1.2)
Platelets: 84 10*3/uL — ABNORMAL LOW (ref 150–400)
Prothrombin Time: 14.9 s (ref 11.4–15.2)
Smear Review: NONE SEEN
aPTT: 30 s (ref 24–36)

## 2022-10-29 LAB — URINALYSIS, MICROSCOPIC (REFLEX)

## 2022-10-29 LAB — HEPATIC FUNCTION PANEL
ALT: 390 U/L — ABNORMAL HIGH (ref 0–44)
AST: 815 U/L — ABNORMAL HIGH (ref 15–41)
Albumin: 3.1 g/dL — ABNORMAL LOW (ref 3.5–5.0)
Alkaline Phosphatase: 254 U/L — ABNORMAL HIGH (ref 38–126)
Bilirubin, Direct: 3.1 mg/dL — ABNORMAL HIGH (ref 0.0–0.2)
Indirect Bilirubin: 2.1 mg/dL — ABNORMAL HIGH (ref 0.3–0.9)
Total Bilirubin: 5.2 mg/dL — ABNORMAL HIGH (ref 0.3–1.2)
Total Protein: 7.3 g/dL (ref 6.5–8.1)

## 2022-10-29 LAB — HEPATITIS PANEL, ACUTE
HCV Ab: NONREACTIVE
Hep A IgM: NONREACTIVE
Hep B C IgM: NONREACTIVE
Hepatitis B Surface Ag: NONREACTIVE

## 2022-10-29 LAB — CBC WITH DIFFERENTIAL/PLATELET
Abs Immature Granulocytes: 0.01 10*3/uL (ref 0.00–0.07)
Basophils Absolute: 0 10*3/uL (ref 0.0–0.1)
Basophils Relative: 1 %
Eosinophils Absolute: 0 10*3/uL (ref 0.0–0.5)
Eosinophils Relative: 0 %
HCT: UNDETERMINED % (ref 39.0–52.0)
Hemoglobin: 13.5 g/dL (ref 13.0–17.0)
Immature Granulocytes: 0 %
Lymphocytes Relative: 55 %
Lymphs Abs: 1.6 10*3/uL (ref 0.7–4.0)
MCH: UNDETERMINED pg (ref 26.0–34.0)
MCHC: UNDETERMINED g/dL (ref 30.0–36.0)
MCV: UNDETERMINED fL (ref 80.0–100.0)
Monocytes Absolute: 0.2 10*3/uL (ref 0.1–1.0)
Monocytes Relative: 7 %
Neutro Abs: 1 10*3/uL — ABNORMAL LOW (ref 1.7–7.7)
Neutrophils Relative %: 37 %
Platelets: 103 10*3/uL — ABNORMAL LOW (ref 150–400)
RBC: UNDETERMINED MIL/uL (ref 4.22–5.81)
RDW: UNDETERMINED % (ref 11.5–15.5)
WBC: 2.8 10*3/uL — ABNORMAL LOW (ref 4.0–10.5)
nRBC: 0 % (ref 0.0–0.2)

## 2022-10-29 LAB — SALICYLATE LEVEL: Salicylate Lvl: <7 mg/dL — ABNORMAL LOW (ref 7.0–30.0)

## 2022-10-29 LAB — URINALYSIS, ROUTINE W REFLEX MICROSCOPIC
Glucose, UA: NEGATIVE mg/dL
Ketones, ur: NEGATIVE mg/dL
Leukocytes,Ua: NEGATIVE
Nitrite: NEGATIVE
Protein, ur: NEGATIVE mg/dL
Specific Gravity, Urine: 1.01 (ref 1.005–1.030)
pH: 6 (ref 5.0–8.0)

## 2022-10-29 LAB — DIFFERENTIAL
Abs Immature Granulocytes: 0.02 10*3/uL (ref 0.00–0.07)
Basophils Absolute: 0 10*3/uL (ref 0.0–0.1)
Basophils Relative: 1 %
Eosinophils Absolute: 0 10*3/uL (ref 0.0–0.5)
Eosinophils Relative: 0 %
Immature Granulocytes: 1 %
Lymphocytes Relative: 25 %
Lymphs Abs: 1 10*3/uL (ref 0.7–4.0)
Monocytes Absolute: 0.2 10*3/uL (ref 0.1–1.0)
Monocytes Relative: 6 %
Neutro Abs: 2.7 10*3/uL (ref 1.7–7.7)
Neutrophils Relative %: 67 %

## 2022-10-29 LAB — CBC
HCT: 31.7 % — ABNORMAL LOW (ref 39.0–52.0)
Hemoglobin: 11.7 g/dL — ABNORMAL LOW (ref 13.0–17.0)
MCH: 32 pg (ref 26.0–34.0)
MCHC: 36.9 g/dL — ABNORMAL HIGH (ref 30.0–36.0)
MCV: 86.6 fL (ref 80.0–100.0)
Platelets: 76 10*3/uL — ABNORMAL LOW (ref 150–400)
RBC: 3.66 MIL/uL — ABNORMAL LOW (ref 4.22–5.81)
RDW: 20.4 % — ABNORMAL HIGH (ref 11.5–15.5)
WBC: 3.9 10*3/uL — ABNORMAL LOW (ref 4.0–10.5)
nRBC: 0.5 % — ABNORMAL HIGH (ref 0.0–0.2)

## 2022-10-29 LAB — TECHNOLOGIST SMEAR REVIEW

## 2022-10-29 LAB — RAPID URINE DRUG SCREEN, HOSP PERFORMED
Amphetamines: NOT DETECTED
Barbiturates: NOT DETECTED
Benzodiazepines: NOT DETECTED
Cocaine: NOT DETECTED
Opiates: NOT DETECTED
Tetrahydrocannabinol: NOT DETECTED

## 2022-10-29 LAB — TRIGLYCERIDES: Triglycerides: >5000 mg/dL — ABNORMAL HIGH (ref <150)

## 2022-10-29 LAB — LIPASE, BLOOD: Lipase: 47 U/L (ref 11–51)

## 2022-10-29 LAB — ETHANOL: Alcohol, Ethyl (B): 227 mg/dL — ABNORMAL HIGH (ref <10)

## 2022-10-29 LAB — CK: Total CK: 240 U/L (ref 49–397)

## 2022-10-29 LAB — MAGNESIUM: Magnesium: 1.2 mg/dL — ABNORMAL LOW (ref 1.7–2.4)

## 2022-10-29 LAB — ACETAMINOPHEN LEVEL: Acetaminophen (Tylenol), Serum: <10 ug/mL — ABNORMAL LOW (ref 10–30)

## 2022-10-29 MED ORDER — LEVETIRACETAM 500 MG PO TABS
750.0000 mg | ORAL_TABLET | ORAL | Status: DC
Start: 2022-10-29 — End: 2022-10-29

## 2022-10-29 MED ORDER — PANTOPRAZOLE SODIUM 40 MG PO TBEC
40.0000 mg | DELAYED_RELEASE_TABLET | Freq: Every day | ORAL | Status: DC
  Administered 2022-10-30 – 2022-11-02 (×4): 40 mg via ORAL
  Filled 2022-10-29 (×4): qty 1

## 2022-10-29 MED ORDER — THIAMINE HCL 100 MG/ML IJ SOLN: 100.0000 mg | Freq: Every day | INTRAMUSCULAR | Status: DC

## 2022-10-29 MED ORDER — POTASSIUM CHLORIDE CRYS ER 20 MEQ PO TBCR
40.0000 meq | EXTENDED_RELEASE_TABLET | Freq: Once | ORAL | Status: AC
  Administered 2022-10-29: 40 meq via ORAL
  Filled 2022-10-29: qty 2

## 2022-10-29 MED ORDER — METOPROLOL SUCCINATE ER 50 MG PO TB24
50.0000 mg | ORAL_TABLET | Freq: Every day | ORAL | Status: DC
  Administered 2022-10-30 – 2022-11-02 (×4): 50 mg via ORAL
  Filled 2022-10-29 (×4): qty 1

## 2022-10-29 MED ORDER — ONDANSETRON HCL 4 MG/2ML IJ SOLN
4.0000 mg | Freq: Four times a day (QID) | INTRAMUSCULAR | Status: DC | PRN
  Administered 2022-10-29 – 2022-10-30 (×2): 4 mg via INTRAVENOUS
  Filled 2022-10-29 (×2): qty 2

## 2022-10-29 MED ORDER — LORAZEPAM 2 MG/ML IJ SOLN: 0.0000 mg | Freq: Two times a day (BID) | INTRAMUSCULAR | Status: DC

## 2022-10-29 MED ORDER — THIAMINE MONONITRATE 100 MG PO TABS
100.0000 mg | ORAL_TABLET | Freq: Every day | ORAL | Status: DC
  Administered 2022-10-29 – 2022-11-01 (×4): 100 mg via ORAL
  Filled 2022-10-29 (×4): qty 1

## 2022-10-29 MED ORDER — ONDANSETRON HCL 4 MG PO TABS
4.0000 mg | ORAL_TABLET | Freq: Four times a day (QID) | ORAL | Status: DC | PRN
  Administered 2022-10-31: 4 mg via ORAL
  Filled 2022-10-29: qty 1

## 2022-10-29 MED ORDER — PANTOPRAZOLE SODIUM 40 MG IV SOLR
40.0000 mg | Freq: Once | INTRAVENOUS | Status: AC
  Administered 2022-10-29: 40 mg via INTRAVENOUS
  Filled 2022-10-29: qty 10

## 2022-10-29 MED ORDER — AMLODIPINE BESYLATE 10 MG PO TABS
10.0000 mg | ORAL_TABLET | Freq: Every day | ORAL | Status: DC
  Administered 2022-10-29 – 2022-11-02 (×5): 10 mg via ORAL
  Filled 2022-10-29 (×2): qty 1
  Filled 2022-10-29: qty 2
  Filled 2022-10-29 (×2): qty 1

## 2022-10-29 MED ORDER — ADULT MULTIVITAMIN W/MINERALS CH
1.0000 | ORAL_TABLET | Freq: Every day | ORAL | Status: DC
  Administered 2022-10-29 – 2022-11-01 (×4): 1 via ORAL
  Filled 2022-10-29 (×4): qty 1

## 2022-10-29 MED ORDER — LEVETIRACETAM 750 MG PO TABS
750.0000 mg | ORAL_TABLET | Freq: Two times a day (BID) | ORAL | Status: DC
  Administered 2022-10-29 – 2022-11-02 (×9): 750 mg via ORAL
  Filled 2022-10-29 (×9): qty 1

## 2022-10-29 MED ORDER — FOLIC ACID 1 MG PO TABS
1.0000 mg | ORAL_TABLET | Freq: Every day | ORAL | Status: DC
  Administered 2022-10-29 – 2022-11-01 (×4): 1 mg via ORAL
  Filled 2022-10-29 (×4): qty 1

## 2022-10-29 MED ORDER — ESCITALOPRAM OXALATE 10 MG PO TABS
5.0000 mg | ORAL_TABLET | Freq: Every day | ORAL | Status: DC
  Administered 2022-10-29 – 2022-11-02 (×5): 5 mg via ORAL
  Filled 2022-10-29 (×6): qty 1

## 2022-10-29 MED ORDER — ESCITALOPRAM OXALATE 10 MG PO TABS: 10.0000 mg | ORAL_TABLET | Freq: Every day | ORAL | Status: DC

## 2022-10-29 MED ORDER — PANTOPRAZOLE SODIUM 40 MG PO TBEC: 40.0000 mg | DELAYED_RELEASE_TABLET | Freq: Every day | ORAL | Status: DC

## 2022-10-29 MED ORDER — LORAZEPAM 1 MG PO TABS
0.0000 mg | ORAL_TABLET | Freq: Two times a day (BID) | ORAL | Status: DC
  Administered 2022-11-01: 1 mg via ORAL
  Filled 2022-10-29: qty 1

## 2022-10-29 MED ORDER — LORAZEPAM 1 MG PO TABS
0.0000 mg | ORAL_TABLET | Freq: Four times a day (QID) | ORAL | Status: AC
  Administered 2022-10-29: 2 mg via ORAL
  Administered 2022-10-29: 1 mg via ORAL
  Filled 2022-10-29: qty 2
  Filled 2022-10-29: qty 1

## 2022-10-29 MED ORDER — LORAZEPAM 2 MG/ML IJ SOLN: 0.0000 mg | Freq: Four times a day (QID) | INTRAMUSCULAR | Status: AC

## 2022-10-29 NOTE — ED Notes (Signed)
Patient was given a cup of ice water.

## 2022-10-29 NOTE — Assessment & Plan Note (Signed)
No seizure since arrival  Continue home keppra dosing CIWA protocol Ativan PRN for seizure Seizure precautions

## 2022-10-29 NOTE — Assessment & Plan Note (Addendum)
Initial CBC with cold agglutin; however, no anemia, he denies feeling cold Repeating CBC with smear, LDH/haptoglobin and ANA Check CXR to r/o atypical pneumonia as cause ANA for autoimmune

## 2022-10-29 NOTE — Assessment & Plan Note (Signed)
Continue protonix daily.

## 2022-10-29 NOTE — ED Provider Notes (Signed)
  Physical Exam  BP 124/87 (BP Location: Left Arm)   Pulse (!) 103   Temp 98.5 F (36.9 C) (Oral)   Resp 18   Ht 5\' 8"  (1.727 m)   Wt 78.9 kg   SpO2 97%   BMI 26.46 kg/m   Physical Exam  Procedures  .Critical Care  Performed by: Darrick Grinder, PA-C Authorized by: Darrick Grinder, PA-C   Critical care provider statement:    Critical care time (minutes):  30   Critical care was necessary to treat or prevent imminent or life-threatening deterioration of the following conditions:  Hepatic failure   Critical care was time spent personally by me on the following activities:  Development of treatment plan with patient or surrogate, discussions with consultants, evaluation of patient's response to treatment, examination of patient, ordering and review of laboratory studies, ordering and review of radiographic studies, ordering and performing treatments and interventions, pulse oximetry, re-evaluation of patient's condition and review of old charts   Care discussed with: admitting provider     ED Course / MDM   Clinical Course as of 10/29/22 0446  Thu Oct 28, 2022  2322 Creatinine(!): <0.30 [LS]  2322 Anion gap(!): 22 [LS]    Clinical Course User Index [LS] Elson Areas, PA-C   Medical Decision Making Amount and/or Complexity of Data Reviewed Labs: ordered. Decision-making details documented in ED Course. Radiology: ordered.  Risk OTC drugs. Prescription drug management. Decision regarding hospitalization.   Patient care assumed from [place], [place] at shift handoff.  See her note for full details.  In short, patient is a 42 year old male who presented concerned that he may have a seizure.  He has a past medical history of seizures and states that when he has alcohol withdrawal he typically has seizure-like activity.  He has been decreasing his alcohol consumption but still endorses drinking up to 1/5 of alcohol (brown liquor (daily.  He does endorse occasional right upper  quadrant tenderness but is not currently tender at this time.  He endorses nausea but no vomiting.  Patient with continued issues with labs hemolyzing.  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  Hepatic function panel concerning with AST 815, ALT 390, alkaline phosphatase 254.  Elevated total bilirubin at 5.2.  With signs of worsening liver function and borderline liver failure feel the patient would benefit from admission for further evaluation and management.  DIC panel, Coombs test, cold agglutinin disease titer ordered.  CIWA protocol initiated as patient has history of heavy alcohol usage.  [place] Right upper quadrant ordered due to patient's intermittent RUQ pain and significantly elevated enzymes. Imaging pending at this time. Patient to be signed out to Fayrene Helper, PA-C at shift handoff. Patient will need admission. May need surgical consult if signs of cholecystitis on ultrasound.        Pamala Duffel 10/29/22 2725    Marily Memos, MD 10/29/22 [phone]

## 2022-10-29 NOTE — Assessment & Plan Note (Signed)
Continue norvasc and toprolol-xl

## 2022-10-29 NOTE — Assessment & Plan Note (Signed)
Check magnesium Replete and trend

## 2022-10-29 NOTE — Assessment & Plan Note (Addendum)
Continue lexapro daily  Has been out of x 1 week Will start at 1/2 dose in setting of acute liver injury (5mg )

## 2022-10-29 NOTE — Assessment & Plan Note (Addendum)
S/p cardioversion in ED in March of 2019  CHA2DS2VASc score of 1

## 2022-10-29 NOTE — ED Provider Notes (Signed)
Received signout from previous provider, please see his note for complete H&P.  This is a 42 year old male with significant history of alcohol abuse, history of seizure on Keppra, who presenting initially with concerns that he may have had seizure episodes without having any seizure activity.  Workup today is remarkable for elevated alcohol level of 227, patient also has evidence of new transaminitis with AST 716, ALT 335, alk phos 201 and total bili of 3.8.  This is new.  An abdominal ultrasound was obtained showing no evidence of biliary disease.  Hepatitis panel came back normal.  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 Artis Flock who agrees to see and will admit pt for further evaluation.  Likely benefit from hematology consult after repeat labs.  Pt was also placed on a CIWA protocol as he is at risk of alcohol withdrawal  BP (!) 133/104   Pulse 90   Temp 98.9 F (37.2 C) (Oral)   Resp 16   Ht 5\' 8"  (1.727 m)   Wt 78.9 kg   SpO2 98%   BMI 26.46 kg/m   Results for orders placed or performed during the hospital encounter of 10/28/22  CBC with Differential  Result Value Ref Range   WBC 2.8 (L) 4.0 - 10.5 K/uL   RBC Unable to determine due to a cold agglutinin 4.22 - 5.81 MIL/uL   Hemoglobin 13.5 13.0 - 17.0 g/dL   HCT Unable to determine due to a cold agglutinin 39.0 - 52.0 %   MCV Unable to determine due to a cold agglutinin 80.0 - 100.0 fL   MCH Unable to determine due to a cold agglutinin 26.0 - 34.0 pg   MCHC Unable to determine due to a cold agglutinin 30.0 - 36.0 g/dL   RDW Unable to determine due to a cold agglutinin 11.5 - 15.5 %   Platelets 103 (L) 150 - 400 K/uL   nRBC 0.0 0.0 - 0.2 %   Neutrophils Relative % 37 %   Neutro Abs 1.0 (L) 1.7 - 7.7 K/uL   Lymphocytes Relative 55 %   Lymphs Abs 1.6 0.7 - 4.0 K/uL   Monocytes Relative 7 %    Monocytes Absolute 0.2 0.1 - 1.0 K/uL   Eosinophils Relative 0 %   Eosinophils Absolute 0.0 0.0 - 0.5 K/uL   Basophils Relative 1 %   Basophils Absolute 0.0 0.0 - 0.1 K/uL   Immature Granulocytes 0 %   Abs Immature Granulocytes 0.01 0.00 - 0.07 K/uL  Basic metabolic panel  Result Value Ref Range   Sodium 136 135 - 145 mmol/L   Potassium 3.8 3.5 - 5.1 mmol/L   Chloride 93 (L) 98 - 111 mmol/L   CO2 21 (L) 22 - 32 mmol/L   Glucose, Bld 106 (H) 70 - 99 mg/dL   BUN 11 6 - 20 mg/dL   Creatinine, Ser <4.40 (L) 0.61 - 1.24 mg/dL   Calcium 8.3 (L) 8.9 - 10.3 mg/dL   GFR, Estimated NOT CALCULATED >60 mL/min   Anion gap 22 (H) 5 - 15  Hepatic function panel  Result Value Ref Range   Total Protein 7.3 6.5 - 8.1 g/dL   Albumin 3.1 (L) 3.5 - 5.0 g/dL   AST 102 (H) 15 - 41 U/L   ALT 390 (H) 0 - 44 U/L   Alkaline Phosphatase 254 (H) 38 - 126 U/L  Total Bilirubin 5.2 (H) 0.3 - 1.2 mg/dL   Bilirubin, Direct 3.1 (H) 0.0 - 0.2 mg/dL   Indirect Bilirubin 2.1 (H) 0.3 - 0.9 mg/dL  Acetaminophen level  Result Value Ref Range   Acetaminophen (Tylenol), Serum <10 (L) 10 - 30 ug/mL  Ethanol  Result Value Ref Range   Alcohol, Ethyl (B) 227 (H) <10 mg/dL  Comprehensive metabolic panel  Result Value Ref Range   Sodium 124 (L) 135 - 145 mmol/L   Potassium 3.2 (L) 3.5 - 5.1 mmol/L   Chloride 87 (L) 98 - 111 mmol/L   CO2 19 (L) 22 - 32 mmol/L   Glucose, Bld 83 70 - 99 mg/dL   BUN 10 6 - 20 mg/dL   Creatinine, Ser <6.57 (L) 0.61 - 1.24 mg/dL   Calcium 7.2 (L) 8.9 - 10.3 mg/dL   Total Protein 6.3 (L) 6.5 - 8.1 g/dL   Albumin 2.5 (L) 3.5 - 5.0 g/dL   AST 846 (H) 15 - 41 U/L   ALT 335 (H) 0 - 44 U/L   Alkaline Phosphatase 201 (H) 38 - 126 U/L   Total Bilirubin 3.8 (H) 0.3 - 1.2 mg/dL   GFR, Estimated NOT CALCULATED >60 mL/min   Anion gap 18 (H) 5 - 15  Lipase, blood  Result Value Ref Range   Lipase 47 11 - 51 U/L  Salicylate level  Result Value Ref Range   Salicylate Lvl <7.0 (L) 7.0 - 30.0  mg/dL  DIC Panel Once  Result Value Ref Range   Prothrombin Time 14.9 11.4 - 15.2 seconds   INR 1.2 0.8 - 1.2   aPTT 30 24 - 36 seconds   Fibrinogen 118 (L) 210 - 475 mg/dL   D-Dimer, Quant LIPEMIC SPECIMEN, RESULTS MAY BE AFFECTED. 0.00 - 0.50 ug/mL-FEU   Platelets 84 (L) 150 - 400 K/uL   Smear Review NO SCHISTOCYTES SEEN   Hepatitis panel, acute  Result Value Ref Range   Hepatitis B Surface Ag NON REACTIVE NON REACTIVE   HCV Ab NON REACTIVE NON REACTIVE   Hep A IgM NON REACTIVE NON REACTIVE   Hep B C IgM NON REACTIVE NON REACTIVE   US Abdomen Limited RUQ (LIVER/GB)  Result Date: 10/29/2022 CLINICAL DATA:  Right upper quadrant pain. EXAM: ULTRASOUND ABDOMEN LIMITED RIGHT UPPER QUADRANT COMPARISON:  10/22/2021 FINDINGS: Gallbladder: No gallstones or wall thickening visualized. No sonographic Murphy sign noted by sonographer. Common bile duct: Diameter: 4 mm Liver: Increased echogenicity of liver parenchyma suggests fatty deposition. Portal vein is patent on color Doppler imaging with normal direction of blood flow towards the liver. Other: None. IMPRESSION: 1. No evidence for cholelithiasis or biliary dilatation. 2. Increased echogenicity of liver parenchyma suggests fatty deposition. Electronically Signed   By: Kennith Center M.D.   On: 10/29/2022 07:50      Fayrene Helper, PA-C 10/29/22 1358    Rondel Baton, MD 10/31/22 1940

## 2022-10-29 NOTE — Assessment & Plan Note (Signed)
Trying to wean off alcohol. Last drank yesterday Progressive placement due to high risk of withdrawal  Ethanol 227 Given thiamine Continue CIWA protocol  Continue thiamine, MV, folic acid SW consult

## 2022-10-29 NOTE — Assessment & Plan Note (Signed)
42 year old with hx of alcohol abuse and withdrawal seizures presenting to ED after feeling like he was going have another seizure and found to have transaminitis and elevated t.bili  -obs to progressive -significant elevated LFTS with no acute liver failure. Concern of acute hepatitis secondary to alcohol  +/- underlying chronic liver disease/alcohol abuse vs. Possible seizure -INR wnl  -hepatitis panel negative, tylenol level negative. Ethanol elevated to 227  -liver US with no acute finding  -check LDH, ANA, ASMA, EBV, CK  -repeat hepatic panel with improvement  -if does not continue to improve: consult GI
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
## 2022-10-29 NOTE — Assessment & Plan Note (Addendum)
Secondary to liver disease +/-cold agglutin  Chronic x 1 month, at baseline  INR wnl  SCDs, no chemical ppx Continue to monitor

## 2022-10-30 LAB — COMPREHENSIVE METABOLIC PANEL
ALT: 309 U/L — ABNORMAL HIGH (ref 0–44)
AST: 676 U/L — ABNORMAL HIGH (ref 15–41)
Albumin: 2.9 g/dL — ABNORMAL LOW (ref 3.5–5.0)
Alkaline Phosphatase: 236 U/L — ABNORMAL HIGH (ref 38–126)
Anion gap: 14 (ref 5–15)
BUN: <5 mg/dL — ABNORMAL LOW (ref 6–20)
CO2: 21 mmol/L — ABNORMAL LOW (ref 22–32)
Calcium: 8.2 mg/dL — ABNORMAL LOW (ref 8.9–10.3)
Chloride: 94 mmol/L — ABNORMAL LOW (ref 98–111)
Creatinine, Ser: 1.06 mg/dL (ref 0.61–1.24)
GFR, Estimated: >60 mL/min (ref >60)
Glucose, Bld: 96 mg/dL (ref 70–99)
Potassium: 3.4 mmol/L — ABNORMAL LOW (ref 3.5–5.1)
Sodium: 129 mmol/L — ABNORMAL LOW (ref 135–145)
Total Bilirubin: 7.5 mg/dL — ABNORMAL HIGH (ref 0.3–1.2)
Total Protein: 6.4 g/dL — ABNORMAL LOW (ref 6.5–8.1)

## 2022-10-30 LAB — CBC
HCT: 30.5 % — ABNORMAL LOW (ref 39.0–52.0)
Hemoglobin: 11.3 g/dL — ABNORMAL LOW (ref 13.0–17.0)
MCH: 32.3 pg (ref 26.0–34.0)
MCHC: 37 g/dL — ABNORMAL HIGH (ref 30.0–36.0)
MCV: 87.1 fL (ref 80.0–100.0)
Platelets: 62 10*3/uL — ABNORMAL LOW (ref 150–400)
RBC: 3.5 MIL/uL — ABNORMAL LOW (ref 4.22–5.81)
RDW: 19.9 % — ABNORMAL HIGH (ref 11.5–15.5)
WBC: 3.3 10*3/uL — ABNORMAL LOW (ref 4.0–10.5)
nRBC: 1.2 % — ABNORMAL HIGH (ref 0.0–0.2)

## 2022-10-30 LAB — PROTIME-INR
INR: 1.1 (ref 0.8–1.2)
Prothrombin Time: 14.2 s (ref 11.4–15.2)

## 2022-10-30 MED ORDER — CARMEX CLASSIC LIP BALM EX OINT
TOPICAL_OINTMENT | CUTANEOUS | Status: DC | PRN
  Filled 2022-10-30: qty 10

## 2022-10-30 MED ORDER — ACETAMINOPHEN 325 MG PO TABS: 650.0000 mg | ORAL_TABLET | Freq: Four times a day (QID) | ORAL | Status: DC | PRN

## 2022-10-30 MED ORDER — MAGNESIUM SULFATE 4 GM/100ML IV SOLN
4.0000 g | Freq: Once | INTRAVENOUS | Status: AC
  Administered 2022-10-30: 4 g via INTRAVENOUS
  Filled 2022-10-30: qty 100

## 2022-10-30 MED ORDER — IBUPROFEN 200 MG PO TABS
600.0000 mg | ORAL_TABLET | Freq: Once | ORAL | Status: AC
  Administered 2022-10-30: 600 mg via ORAL
  Filled 2022-10-30: qty 3

## 2022-10-30 NOTE — Plan of Care (Signed)

## 2022-10-30 NOTE — Plan of Care (Signed)
  Problem: Coping: Goal: Ability to adjust to condition or change in health will improve Outcome: Progressing   Problem: Safety: Goal: Verbalization of understanding the information provided will improve Outcome: Progressing   Problem: Self-Concept: Goal: Level of anxiety will decrease Outcome: Progressing   Problem: Self-Concept: Goal: Ability to verbalize feelings about condition will improve Outcome: Progressing   Problem: Education: Goal: Knowledge of General Education information will improve Description: Including pain rating scale, medication(s)/side effects and non-pharmacologic comfort measures Outcome: Progressing   Problem: Nutrition: Goal: Adequate nutrition will be maintained Outcome: Progressing

## 2022-10-30 NOTE — Progress Notes (Signed)
   10/30/22 2335  Assess: MEWS Score  Temp (!) 100.9 F (38.3 C)  BP 136/87  MAP (mmHg) 103  Pulse Rate (!) 105  ECG Heart Rate (!) 107  Resp 18  SpO2 97 %  O2 Device Room Air  Assess: MEWS Score  MEWS Temp 1  MEWS Systolic 0  MEWS Pulse 1  MEWS RR 0  MEWS LOC 0  MEWS Score 2  MEWS Score Color Yellow  Assess: if the MEWS score is Yellow or Red  Were vital signs accurate and taken at a resting state? Yes  Does the patient meet 2 or more of the SIRS criteria? No  MEWS guidelines implemented  Yes, yellow  Treat  MEWS Interventions Considered administering scheduled or prn medications/treatments as ordered  Take Vital Signs  Increase Vital Sign Frequency  Yellow: Q2hr x1, continue Q4hrs until patient remains green for 12hrs  Escalate  MEWS: Escalate Yellow: Discuss with charge nurse and consider notifying provider and/or RRT  Notify: Charge Nurse/RN  Name of Charge Nurse/RN Notified Mindy, RN  Provider Notification  Provider Name/Title Dr. Laban Emperor  Date Provider Notified 10/30/22  Time Provider Notified 2317  Method of Notification Page  Notification Reason Change in status (fever)  Provider response See new orders (ibuprofen, blood cultures and covid test)  Date of Provider Response 10/30/22  Time of Provider Response 2324  Assess: SIRS CRITERIA  SIRS Temperature  0  SIRS Pulse 1  SIRS Respirations  0  SIRS WBC 0  SIRS Score Sum  1   Blood cultures and Covid test ordered. Pt given 1x 600mg  ibuprofen. Covid Negative.

## 2022-10-30 NOTE — Progress Notes (Signed)
HOSPITALIST ROUNDING NOTE [name] [id number]:[id number]  DOB: 07-Oct-1980  DOA: 10/28/2022  PCP: Sharon Seller, NP  10/30/2022,8:41 AM   LOS: 0 days      Code Status: Full   From: Home  current Dispo: Likely home     42 year old black male, drinks 1/5 of brown liquor daily Seizure disorder reflux?  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 At risk for withdrawal therefore keep on protocol, Len Blalock is about 10 or 11 ]has been told in the past he has additionally to quit Abdominal pain is better and he wants to eat so we will allow a diet and see how he does as he felt it was gastritis like He tells me that Keppra is too expensive but good Rx coupon states is less than $30 so he will need continued 750 twice daily dose If he insists on going home we can send him home later today  Hypertriglyceridemia Has been told previously he has high triglycerides but is not taking medication I would suggest follow-up with PCP and discussion with regards to statin if he is able to quit EtOH as some cholesterol-lowering medications alter LFTs  ?  Close COld agglutinin disease Patient had lipemic sample which can alter all labs Repeat as an outpatient ASMA, CK, ANA and other workup  Hypokalemia As well as hypomagnesemia Replace magnesium with 4 g as expected will stay low, replaced K  Prior A-fib status post cardioversion 2019 Continue metoprolol XL 50  daily  Generalized anxiety disorder Resume Lexapro 10  Reflux Resume Protonix 40 daily  DVT prophylaxis: SCD  Status is: Observation The patient remains OBS appropriate and will d/c before 2 midnights.    Subjective: Awake coherent tells me cannot afford his Keppra He otherwise looks fair He wants to eat and is trying to order a diet  Objective + exam Vitals:   10/29/22 2132 10/29/22 2338 10/30/22 0348 10/30/22 0743  BP: (!) 146/106 (!) 149/91 (!) 143/93 (!) 135/92  Pulse: (!) 111 79 (!) 102 (!) 107  Resp: 20 18 18 18   Temp: 98.4 F (36.9 C) 99.3 F (37.4 C) 98.9 F (37.2 C) 99 F (37.2 C)  TempSrc: Oral Oral Oral Oral  SpO2: 98% 96% 100% 98%  Weight:      Height:       Filed Weights   10/28/22 2034  Weight: 78.9 kg    Examination:  Looks about stated age, slightly tremulous,  S1-S2 no murmur, telemetry = missed beat slight sinus tach Abdomen soft no rebound extremity edema Neuro is intact to power, rest of exam deferred  Data Reviewed: reviewed   CBC    Component Value Date/Time   WBC 3.9 (L) 10/29/2022 1854   RBC 3.66 (L) 10/29/2022 1854   HGB 11.7 (L) 10/29/2022 1854   HCT 31.7 (L) 10/29/2022 1854   PLT 76 (L) 10/29/2022 1854   MCV 86.6 10/29/2022 1854   MCH 32.0 10/29/2022 1854   MCHC 36.9 (H) 10/29/2022 1854   RDW  20.4 (H) 10/29/2022 1854   LYMPHSABS 1.0 10/29/2022 1854   MONOABS 0.2 10/29/2022 1854   EOSABS 0.0 10/29/2022 1854   BASOSABS 0.0 10/29/2022 1854      Latest Ref Rng & Units 10/29/2022    6:54 PM 10/29/2022    3:22 AM 10/28/2022    9:41 PM  CMP  Glucose 70 - 99 mg/dL 88  83  454   BUN 6 - 20 mg/dL 9  10  11    Creatinine 0.61 - 1.24 mg/dL 0.98  <1.19  <1.47   Sodium 135 - 145 mmol/L 132  124  136   Potassium 3.5 - 5.1 mmol/L 3.9  3.2  3.8   Chloride 98 - 111 mmol/L 93  87  93   CO2 22 - 32 mmol/L 19  19  21    Calcium 8.9 - 10.3 mg/dL 8.4  7.2  8.3   Total Protein 6.5 - 8.1 g/dL 6.7  6.3  7.3   Total Bilirubin 0.3 - 1.2 mg/dL  7.0  3.8  5.2   Alkaline Phos 38 - 126 U/L 244  201  254   AST 15 - 41 U/L 901  716  815   ALT 0 - 44 U/L 354  335  390      Scheduled Meds:  amLODipine  10 mg Oral Daily   escitalopram  5 mg Oral Daily   folic acid  1 mg Oral Daily   levETIRAcetam  750 mg Oral BID   LORazepam  0-4 mg Intravenous Q6H   Or   LORazepam  0-4 mg Oral Q6H   [START ON 10/31/2022] LORazepam  0-4 mg Intravenous Q12H   Or   [START ON 10/31/2022] LORazepam  0-4 mg Oral Q12H   metoprolol succinate  50 mg Oral Daily   multivitamin with minerals  1 tablet Oral Daily   pantoprazole  40 mg Oral Daily   thiamine  100 mg Oral Daily   Or   thiamine  100 mg Intravenous Daily   Continuous Infusions:  Time 50  Rhetta Mura, MD  [facility]

## 2022-10-31 LAB — COMPREHENSIVE METABOLIC PANEL
ALT: 242 U/L — ABNORMAL HIGH (ref 0–44)
AST: 333 U/L — ABNORMAL HIGH (ref 15–41)
Albumin: 2.9 g/dL — ABNORMAL LOW (ref 3.5–5.0)
Alkaline Phosphatase: 261 U/L — ABNORMAL HIGH (ref 38–126)
Anion gap: 12 (ref 5–15)
BUN: <5 mg/dL — ABNORMAL LOW (ref 6–20)
CO2: 21 mmol/L — ABNORMAL LOW (ref 22–32)
Calcium: 8.2 mg/dL — ABNORMAL LOW (ref 8.9–10.3)
Chloride: 92 mmol/L — ABNORMAL LOW (ref 98–111)
Creatinine, Ser: 1.02 mg/dL (ref 0.61–1.24)
GFR, Estimated: >60 mL/min (ref >60)
Glucose, Bld: 105 mg/dL — ABNORMAL HIGH (ref 70–99)
Potassium: 3.4 mmol/L — ABNORMAL LOW (ref 3.5–5.1)
Sodium: 125 mmol/L — ABNORMAL LOW (ref 135–145)
Total Bilirubin: 6.9 mg/dL — ABNORMAL HIGH (ref 0.3–1.2)
Total Protein: 6.7 g/dL (ref 6.5–8.1)

## 2022-10-31 LAB — CBC WITH DIFFERENTIAL/PLATELET
Abs Immature Granulocytes: 0.03 10*3/uL (ref 0.00–0.07)
Basophils Absolute: 0 10*3/uL (ref 0.0–0.1)
Basophils Relative: 1 %
Eosinophils Absolute: 0 10*3/uL (ref 0.0–0.5)
Eosinophils Relative: 1 %
HCT: 32.4 % — ABNORMAL LOW (ref 39.0–52.0)
Hemoglobin: 11.2 g/dL — ABNORMAL LOW (ref 13.0–17.0)
Immature Granulocytes: 1 %
Lymphocytes Relative: 16 %
Lymphs Abs: 0.9 10*3/uL (ref 0.7–4.0)
MCH: 30.9 pg (ref 26.0–34.0)
MCHC: 34.6 g/dL (ref 30.0–36.0)
MCV: 89.5 fL (ref 80.0–100.0)
Monocytes Absolute: 0.2 10*3/uL (ref 0.1–1.0)
Monocytes Relative: 3 %
Neutro Abs: 4.4 10*3/uL (ref 1.7–7.7)
Neutrophils Relative %: 78 %
Platelets: 52 10*3/uL — ABNORMAL LOW (ref 150–400)
RBC: 3.62 MIL/uL — ABNORMAL LOW (ref 4.22–5.81)
RDW: 18.9 % — ABNORMAL HIGH (ref 11.5–15.5)
WBC: 5.5 10*3/uL (ref 4.0–10.5)
nRBC: 0 % (ref 0.0–0.2)

## 2022-10-31 LAB — SARS CORONAVIRUS 2 BY RT PCR: SARS Coronavirus 2 by RT PCR: NEGATIVE

## 2022-10-31 LAB — PROTIME-INR
INR: 1 (ref 0.8–1.2)
Prothrombin Time: 13 s (ref 11.4–15.2)

## 2022-10-31 MED ORDER — POLYETHYLENE GLYCOL 3350 17 G PO PACK
17.0000 g | PACK | Freq: Every day | ORAL | Status: DC
  Administered 2022-10-31: 17 g via ORAL
  Filled 2022-10-31 (×3): qty 1

## 2022-10-31 MED ORDER — LORATADINE 10 MG PO TABS
10.0000 mg | ORAL_TABLET | Freq: Every day | ORAL | Status: DC
  Administered 2022-10-31 – 2022-11-02 (×3): 10 mg via ORAL
  Filled 2022-10-31 (×3): qty 1

## 2022-10-31 MED ORDER — FENOFIBRATE 160 MG PO TABS: 160.0000 mg | ORAL_TABLET | Freq: Every day | ORAL | Status: DC

## 2022-10-31 NOTE — Plan of Care (Signed)
  Problem: Education: Goal: Expressions of having a comfortable level of knowledge regarding the disease process will increase Outcome: Progressing   Problem: Coping: Goal: Ability to adjust to condition or change in health will improve Outcome: Progressing Goal: Ability to identify appropriate support needs will improve Outcome: Progressing   Problem: Health Behavior/Discharge Planning: Goal: Compliance with prescribed medication regimen will improve Outcome: Progressing   Problem: Medication: Goal: Risk for medication side effects will decrease Outcome: Progressing   Problem: Clinical Measurements: Goal: Complications related to the disease process, condition or treatment will be avoided or minimized Outcome: Progressing Goal: Diagnostic test results will improve Outcome: Progressing   Problem: Safety: Goal: Verbalization of understanding the information provided will improve Outcome: Progressing   Problem: Self-Concept: Goal: Level of anxiety will decrease Outcome: Progressing Goal: Ability to verbalize feelings about condition will improve Outcome: Progressing   Problem: Education: Goal: Knowledge of General Education information will improve Description: Including pain rating scale, medication(s)/side effects and non-pharmacologic comfort measures Outcome: Progressing   Problem: Clinical Measurements: Goal: Ability to maintain clinical measurements within normal limits will improve Outcome: Progressing Goal: Will remain free from infection Outcome: Progressing Goal: Diagnostic test results will improve Outcome: Progressing Goal: Respiratory complications will improve Outcome: Progressing Goal: Cardiovascular complication will be avoided Outcome: Progressing   Problem: Health Behavior/Discharge Planning: Goal: Ability to manage health-related needs will improve Outcome: Progressing   Problem: Activity: Goal: Risk for activity intolerance will  decrease Outcome: Progressing   Problem: Nutrition: Goal: Adequate nutrition will be maintained Outcome: Progressing

## 2022-10-31 NOTE — Progress Notes (Signed)
HOSPITALIST ROUNDING NOTE Viola Michalczyk [id number]  DOB: 04-14-1980  DOA: 10/28/2022  PCP: Sharon Seller, NP  10/31/2022,10:21 AM   LOS: 1 day      Code Status: Full   From: Home  current Dispo: Likely home     42 year old black male, drinks 1/5 of brown liquor daily Seizure disorder reflux?  Cold agglutinin disease HTN anxiety history of EtOH with prior EtOH seizures Last hospitalization 7/22-7/24/2024 with breakthrough seizures and probable alcoholic pancreatitis-echo done for transient A-fib at that time EF 70-75%  Came to ED feeling like he was "about to have a seizure" he typically gets some mild he does not drink--- is feeling nauseous having abdominal pain additionally AST 815 ALT 390 alk phos 254 bilirubin 5.2--- triglycerides >5000 DIC Coombs cold agglutinin all in process US liver no obstruction just fatty deposition Platelets 70s to 100  Plan  Tmax 109 overnight 9/14 Etiology unclear COVID-negative no urinary symptoms no diarrhea no cough Could have been from underlying withdrawals?  Monitor the blood cultures and if stable will discharge tomorrow-I do not think he needs antibiotics at this time  Alcoholic transaminitis Alcohol abuse disorder Alcohol protocol is now not really needed as his CIWA is less than 5-keep on board until this afternoon and then discontinue  Severe Hypertriglyceridemia Has been told previously he has high triglycerides but is not taking medication Will add fenofibrate and he may need follow-up with either lipid clinic or PCP  ?  Close COld agglutinin disease Patient had lipemic sample which can alter all labs  Hypokalemia As well as hypomagnesemia Replace magnesium with 4 g as expected will stay low, replaced K  Prior A-fib status post cardioversion 2019 Continue metoprolol XL 50 daily  Generalized anxiety disorder Resume Lexapro 10  Reflux Resume Protonix 40 daily  DVT prophylaxis: SCD  Status is: Observation The patient  remains OBS appropriate and will d/c before 2 midnights.    Subjective:  Overnight events noted-no distress, looks comfortable Denies fever chills nausea vomiting dark stool tarry stool diarrhea rash or other issues  Objective + exam Vitals:   10/30/22 2335 10/31/22 0218 10/31/22 0528 10/31/22 0753  BP: 136/87  117/84 139/89  Pulse: (!) 105  88 86  Resp: 18  17 18   Temp: (!) 100.9 F (38.3 C) 99.8 F (37.7 C) 99.4 F (37.4 C) 99.5 F (37.5 C)  TempSrc: Oral Oral Oral Oral  SpO2: 97%  98% 100%  Weight:      Height:       Filed Weights   10/28/22 2034  Weight: 78.9 kg    Examination:  Pleasant coherent no distress Neck soft supple S1-S2 no murmur Chest clear Abdomen soft no rebound no shifting dullness no right upper quadrant or epigastric tenderness No asterixis no tremors Moving all 4 limbs equally  Data Reviewed: reviewed   CBC    Component Value Date/Time   WBC 5.5 10/31/2022 0713   RBC 3.62 (L) 10/31/2022 0713   HGB 11.2 (L) 10/31/2022 0713   HCT 32.4 (L) 10/31/2022 0713   PLT 52 (L) 10/31/2022 0713   MCV 89.5 10/31/2022 0713   MCH 30.9 10/31/2022 0713   MCHC 34.6 10/31/2022 0713   RDW 18.9 (H) 10/31/2022 0713   LYMPHSABS 0.9 10/31/2022 0713   MONOABS 0.2 10/31/2022 0713   EOSABS 0.0 10/31/2022 0713   BASOSABS 0.0 10/31/2022 0713      Latest Ref Rng & Units 10/30/2022    8:58 AM 10/29/2022    6:54  PM 10/29/2022    3:22 AM  CMP  Glucose 70 - 99 mg/dL 96  88  83   BUN 6 - 20 mg/dL 5  9  10    Creatinine 0.61 - 1.24 mg/dL 5.28  4.13  <2.44   Sodium 135 - 145 mmol/L 129  132  124   Potassium 3.5 - 5.1 mmol/L 3.4  3.9  3.2   Chloride 98 - 111 mmol/L 94  93  87   CO2 22 - 32 mmol/L 21  19  19    Calcium 8.9 - 10.3 mg/dL 8.2  8.4  7.2   Total Protein 6.5 - 8.1 g/dL 6.4  6.7  6.3   Total Bilirubin 0.3 - 1.2 mg/dL 7.5  7.0  3.8   Alkaline Phos 38 - 126 U/L 236  244  201   AST 15 - 41 U/L 676  901  716   ALT 0 - 44 U/L 309  354  335      Scheduled  Meds:  amLODipine  10 mg Oral Daily   escitalopram  5 mg Oral Daily   folic acid  1 mg Oral Daily   levETIRAcetam  750 mg Oral BID   loratadine  10 mg Oral Daily   LORazepam  0-4 mg Intravenous Q12H   Or   LORazepam  0-4 mg Oral Q12H   metoprolol succinate  50 mg Oral Daily   multivitamin with minerals  1 tablet Oral Daily   pantoprazole  40 mg Oral Daily   thiamine  100 mg Oral Daily   Or   thiamine  100 mg Intravenous Daily   Continuous Infusions:  Time 25  Rhetta Mura, MD  [facility]

## 2022-11-01 ENCOUNTER — Inpatient Hospital Stay (HOSPITAL_COMMUNITY): Payer: Self-pay

## 2022-11-01 LAB — CBC WITH DIFFERENTIAL/PLATELET
Abs Immature Granulocytes: 0.08 10*3/uL — ABNORMAL HIGH (ref 0.00–0.07)
Basophils Absolute: 0.1 10*3/uL (ref 0.0–0.1)
Basophils Relative: 1 %
Eosinophils Absolute: 0 10*3/uL (ref 0.0–0.5)
Eosinophils Relative: 0 %
HCT: 32.5 % — ABNORMAL LOW (ref 39.0–52.0)
Hemoglobin: 11.1 g/dL — ABNORMAL LOW (ref 13.0–17.0)
Immature Granulocytes: 1 %
Lymphocytes Relative: 17 %
Lymphs Abs: 1.6 10*3/uL (ref 0.7–4.0)
MCH: 30.5 pg (ref 26.0–34.0)
MCHC: 34.2 g/dL (ref 30.0–36.0)
MCV: 89.3 fL (ref 80.0–100.0)
Monocytes Absolute: 0.5 10*3/uL (ref 0.1–1.0)
Monocytes Relative: 5 %
Neutro Abs: 7.1 10*3/uL (ref 1.7–7.7)
Neutrophils Relative %: 76 %
Platelets: 61 10*3/uL — ABNORMAL LOW (ref 150–400)
RBC: 3.64 MIL/uL — ABNORMAL LOW (ref 4.22–5.81)
RDW: 18.3 % — ABNORMAL HIGH (ref 11.5–15.5)
WBC: 9.4 10*3/uL (ref 4.0–10.5)
nRBC: 0 % (ref 0.0–0.2)

## 2022-11-01 LAB — COMPREHENSIVE METABOLIC PANEL
ALT: 172 U/L — ABNORMAL HIGH (ref 0–44)
AST: 156 U/L — ABNORMAL HIGH (ref 15–41)
Albumin: 2.8 g/dL — ABNORMAL LOW (ref 3.5–5.0)
Alkaline Phosphatase: 266 U/L — ABNORMAL HIGH (ref 38–126)
Anion gap: 11 (ref 5–15)
BUN: <5 mg/dL — ABNORMAL LOW (ref 6–20)
CO2: 25 mmol/L (ref 22–32)
Calcium: 8.7 mg/dL — ABNORMAL LOW (ref 8.9–10.3)
Chloride: 94 mmol/L — ABNORMAL LOW (ref 98–111)
Creatinine, Ser: 1.13 mg/dL (ref 0.61–1.24)
GFR, Estimated: >60 mL/min (ref >60)
Glucose, Bld: 103 mg/dL — ABNORMAL HIGH (ref 70–99)
Potassium: 3.6 mmol/L (ref 3.5–5.1)
Sodium: 130 mmol/L — ABNORMAL LOW (ref 135–145)
Total Bilirubin: 5.9 mg/dL — ABNORMAL HIGH (ref 0.3–1.2)
Total Protein: 7.3 g/dL (ref 6.5–8.1)

## 2022-11-01 LAB — MAGNESIUM: Magnesium: 1.7 mg/dL (ref 1.7–2.4)

## 2022-11-01 MED ORDER — POLYVINYL ALCOHOL 1.4 % OP SOLN
1.0000 [drp] | OPHTHALMIC | Status: DC | PRN
  Administered 2022-11-01 – 2022-11-02 (×2): 1 [drp] via OPHTHALMIC
  Filled 2022-11-01: qty 15

## 2022-11-01 MED ORDER — IOHEXOL 9 MG/ML PO SOLN
500.0000 mL | ORAL | Status: AC
  Administered 2022-11-01 (×2): 500 mL via ORAL

## 2022-11-01 MED ORDER — IOHEXOL 350 MG/ML SOLN
75.0000 mL | Freq: Once | INTRAVENOUS | Status: AC | PRN
  Administered 2022-11-01: 75 mL via INTRAVENOUS

## 2022-11-01 NOTE — TOC CAGE-AID Note (Signed)
Transition of Care [facility]) - CAGE-AID Screening   Patient Details  Name: Ossian Liebold MRN: [id number] Date of Birth: 21-Jan-1981  Transition of Care [facility]) CM/SW Contact:    Kermit Balo, RN Phone Number: 11/01/2022, 12:24 PM   Clinical Narrative: Pt refused counseling resources   CAGE-AID Screening:    Have You Ever Felt You Ought to Cut Down on Your Drinking or Drug Use?: Yes Have People Annoyed You By Critizing Your Drinking Or Drug Use?: No Have You Felt Bad Or Guilty About Your Drinking Or Drug Use?: No Have You Ever Had a Drink or Used Drugs First Thing In The Morning to Steady Your Nerves or to Get Rid of a Hangover?: No CAGE-AID Score: 1  Substance Abuse Education Offered: Yes (pt refused)

## 2022-11-01 NOTE — Plan of Care (Signed)

## 2022-11-01 NOTE — TOC Initial Note (Signed)
Transition of Care [facility]) - Initial/Assessment Note    Patient Details  Name: Devon Hanson MRN: [id number] Date of Birth: 1980-06-02  [facility]) CM/SW Contact:    Kermit Balo, RN Phone Number: 11/01/2022, 12:26 PM  Clinical Narrative:                 Pt is from home alone. He says his estranged wife can check on him.  Pt drives self and manages his own medications. He denies any issues.  Pt is working to get his health insurance set up. He says he is managing the cost of medications ok so far.  TOC following.  Expected Discharge Plan: Home/Self Care Barriers to Discharge: Continued Medical Work up   Patient Goals and CMS Choice            Expected Discharge Plan and Services   Discharge Planning Services: CM Consult   Living arrangements for the past 2 months: Apartment                                      Prior Living Arrangements/Services Living arrangements for the past 2 months: Apartment Lives with:: Self Patient language and need for interpreter reviewed:: Yes Do you feel safe going back to the place where you live?: Yes        Care giver support system in place?: No (comment)   Criminal Activity/Legal Involvement Pertinent to Current Situation/Hospitalization: No - Comment as needed  Activities of Daily Living Home Assistive Devices/Equipment: None ADL Screening (condition at time of admission) Patient's cognitive ability adequate to safely complete daily activities?: Yes Is the patient deaf or have difficulty hearing?: No Does the patient have difficulty seeing, even when wearing glasses/contacts?: No Does the patient have difficulty concentrating, remembering, or making decisions?: No Patient able to express need for assistance with ADLs?: Yes Does the patient have difficulty dressing or bathing?: No Independently performs ADLs?: Yes (appropriate for developmental age) Does the patient have difficulty walking or climbing  stairs?: No Weakness of Legs: None Weakness of Arms/Hands: None  Permission Sought/Granted                  Emotional Assessment Appearance:: Appears stated age Attitude/Demeanor/Rapport: Engaged Affect (typically observed): Accepting Orientation: : Oriented to Self, Oriented to Place, Oriented to  Time, Oriented to Situation   Psych Involvement: No (comment)  Admission diagnosis:  Seizure (HCC) [R56.9] Transaminitis [R74.01] Elevated liver enzymes [R74.8] Alcohol withdrawal syndrome without complication (HCC) [F10.930] Patient Active Problem List   Diagnosis Date Noted   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   Thrombocytopenia (HCC) 10/29/2022   History of atrial fibrillation 10/29/2022   Elevated lipase 09/06/2022   New onset atrial fibrillation (HCC) 09/06/2022   Transaminitis 02/06/2022   GERD (gastroesophageal reflux disease) 10/22/2021   Essential hypertension 10/22/2021   Hypokalemia 10/22/2021   Alcohol use disorder    Lumbar compression fracture, closed, initial encounter [facility])    Thoracic compression fracture, closed, initial encounter (HCC)    Seizure due to alcohol withdrawal (HCC) 10/21/2021   Seizure (HCC) 10/21/2021   GAD (generalized anxiety disorder) 10/20/2021   Shoulder joint instability, left    Type 2 superior labral anterior-to-posterior (SLAP) tear of shoulder, subsequent encounter    PCP:  Sharon Seller, NP Pharmacy:   [facility] [phone] - [place], [place] - [address]  [place] [address] Phone: [phone] Fax: [phone]     Social Determinants of Health (SDOH) Social History: SDOH Screenings   Food Insecurity: No Food Insecurity (10/30/2022)  Housing: Low Risk  (10/30/2022)  Transportation Needs: No Transportation Needs (10/30/2022)  Utilities: Not At Risk (10/30/2022)  Depression (PHQ2-9): Low Risk  (04/19/2022)  Social Connections: Unknown (06/17/2021)    Received from [facility]  Tobacco Use: Low Risk  (10/28/2022)   SDOH Interventions:     Readmission Risk Interventions    02/08/2022    2:19 PM  Readmission Risk Prevention Plan  Transportation Screening Complete  PCP or Specialist Appt within 5-7 Days Complete  Home Care Screening Complete  Medication Review (RN CM) Complete

## 2022-11-01 NOTE — Progress Notes (Signed)
HOSPITALIST ROUNDING NOTE Devon Hanson [id number]  DOB: 07/27/1980  DOA: 10/28/2022  PCP: Sharon Seller, NP  11/01/2022,12:06 PM   LOS: 2 days      Code Status: Full   From: Home  current Dispo: Likely home     42 year old black male, drinks 1/5 of brown liquor daily Seizure disorder reflux?  Cold agglutinin disease HTN anxiety history of EtOH with prior EtOH seizures Last hospitalization 7/22-7/24/2024 with breakthrough seizures and probable alcoholic pancreatitis-echo done for transient A-fib at that time EF 70-75%  Came to ED feeling like he was "about to have a seizure" he typically gets some mild he does not drink--- is feeling nauseous having abdominal pain additionally AST 815 ALT 390 alk phos 254 bilirubin 5.2--- triglycerides >5000 DIC Coombs cold agglutinin all in process US liver no obstruction just fatty deposition Platelets 70s to 100  Started to spike fevers evening of 9/14 without overt source blood cultures have been negative  Plan Tmax 102 overnight 9/15 Etiology unclear COVID-negative no urinary symptoms no diarrhea no cough Could be from just alcoholic hepatitis?--- If no further fever and if imaging negative likely can go home in a.m.--if he has abdominal pain or other issues with test H. 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  Close COld agglutinin disease Does not have typical findings x-ray was normal on admission  Hypokalemia,  hyponatremia As well as hypomagnesemia Replace magnesium with 4 g as expected will stay low, replaced K and is normal Recheck all labs a.m.  Prior A-fib status post cardioversion 2019 Continue metoprolol XL 50 daily  Generalized anxiety disorder Resume Lexapro 10  Reflux Resume Protonix 40 daily  DVT prophylaxis: SCD  Status is: Observation The patient remains OBS appropriate and will d/c before 2 midnights.    Subjective:  Tmax 102 overnight No constitutional symptoms  Objective + exam Vitals:   10/31/22 2320 11/01/22 0340 11/01/22 0723 11/01/22 0729  BP: 125/75 126/74  121/84  Pulse: (!) 108 (!) 108  98  Resp: 18 18  18   Temp: (!) 101 F (38.3 C) 99.7 F (37.6 C) 99.1 F (37.3 C) 99.1 F (37.3 C)  TempSrc: Oral Oral Rectal Oral  SpO2: 100% 98%  100%  Weight:      Height:       Filed Weights   10/28/22 2034  Weight: 78.9 kg    Examination:  EOMI and CAT slightly icteric S1-S2 no murmur Chest clear Abdomen soft no rebound no HSM no organomegaly No asterixis no tremors  Data Reviewed: reviewed   CBC    Component Value Date/Time   WBC 9.4 11/01/2022 0730   RBC 3.64 (L) 11/01/2022 0730   HGB 11.1 (L) 11/01/2022 0730   HCT 32.5 (L) 11/01/2022 0730   PLT 61 (L) 11/01/2022 0730   MCV 89.3 11/01/2022 0730   MCH 30.5 11/01/2022 0730   MCHC 34.2 11/01/2022 0730   RDW 18.3 (H) 11/01/2022 0730   LYMPHSABS 1.6 11/01/2022 0730  MONOABS 0.5 11/01/2022 0730   EOSABS 0.0 11/01/2022 0730   BASOSABS 0.1 11/01/2022 0730      Latest Ref Rng & Units 11/01/2022    7:30 AM 10/31/2022    7:13 AM 10/30/2022    8:58 AM  CMP  Glucose 70 - 99 mg/dL 161  096  96   BUN 6 - 20 mg/dL <5  <5  <5   Creatinine 0.61 - 1.24 mg/dL 0.45  4.09  8.11   Sodium 135 - 145 mmol/L 130  125  129   Potassium 3.5 - 5.1 mmol/L 3.6  3.4  3.4   Chloride 98 - 111 mmol/L 94  92  94   CO2 22 - 32 mmol/L 25  21  21    Calcium 8.9 - 10.3 mg/dL 8.7  8.2  8.2   Total Protein 6.5 - 8.1 g/dL  7.3  6.7  6.4   Total Bilirubin 0.3 - 1.2 mg/dL 5.9  6.9  7.5   Alkaline Phos 38 - 126 U/L 266  261  236   AST 15 - 41 U/L 156  333  676   ALT 0 - 44 U/L 172  242  309      Scheduled Meds:  amLODipine  10 mg Oral Daily   escitalopram  5 mg Oral Daily   folic acid  1 mg Oral Daily   levETIRAcetam  750 mg Oral BID   loratadine  10 mg Oral Daily   LORazepam  0-4 mg Intravenous Q12H   Or   LORazepam  0-4 mg Oral Q12H   metoprolol succinate  50 mg Oral Daily   multivitamin with minerals  1 tablet Oral Daily   pantoprazole  40 mg Oral Daily   polyethylene glycol  17 g Oral Daily   thiamine  100 mg Oral Daily   Or   thiamine  100 mg Intravenous Daily   Continuous Infusions:  Time 35  Rhetta Mura, MD  [facility]

## 2022-11-02 ENCOUNTER — Encounter: Payer: Self-pay | Admitting: Family Medicine

## 2022-11-02 LAB — CBC WITH DIFFERENTIAL/PLATELET
Abs Immature Granulocytes: 0.05 10*3/uL (ref 0.00–0.07)
Basophils Absolute: 0 10*3/uL (ref 0.0–0.1)
Basophils Relative: 1 %
Eosinophils Absolute: 0.1 10*3/uL (ref 0.0–0.5)
Eosinophils Relative: 1 %
HCT: 29.8 % — ABNORMAL LOW (ref 39.0–52.0)
Hemoglobin: 10.3 g/dL — ABNORMAL LOW (ref 13.0–17.0)
Immature Granulocytes: 1 %
Lymphocytes Relative: 19 %
Lymphs Abs: 1.4 10*3/uL (ref 0.7–4.0)
MCH: 31.7 pg (ref 26.0–34.0)
MCHC: 34.6 g/dL (ref 30.0–36.0)
MCV: 91.7 fL (ref 80.0–100.0)
Monocytes Absolute: 0.4 10*3/uL (ref 0.1–1.0)
Monocytes Relative: 6 %
Neutro Abs: 5.1 10*3/uL (ref 1.7–7.7)
Neutrophils Relative %: 72 %
Platelets: 117 10*3/uL — ABNORMAL LOW (ref 150–400)
RBC: 3.25 MIL/uL — ABNORMAL LOW (ref 4.22–5.81)
RDW: 17.7 % — ABNORMAL HIGH (ref 11.5–15.5)
WBC: 7 10*3/uL (ref 4.0–10.5)
nRBC: 0 % (ref 0.0–0.2)

## 2022-11-02 LAB — COMPREHENSIVE METABOLIC PANEL
ALT: 147 U/L — ABNORMAL HIGH (ref 0–44)
AST: 165 U/L — ABNORMAL HIGH (ref 15–41)
Albumin: 2.8 g/dL — ABNORMAL LOW (ref 3.5–5.0)
Alkaline Phosphatase: 255 U/L — ABNORMAL HIGH (ref 38–126)
Anion gap: 14 (ref 5–15)
BUN: 6 mg/dL (ref 6–20)
CO2: 21 mmol/L — ABNORMAL LOW (ref 22–32)
Calcium: 8.4 mg/dL — ABNORMAL LOW (ref 8.9–10.3)
Chloride: 96 mmol/L — ABNORMAL LOW (ref 98–111)
Creatinine, Ser: 1.04 mg/dL (ref 0.61–1.24)
GFR, Estimated: >60 mL/min (ref >60)
Glucose, Bld: 135 mg/dL — ABNORMAL HIGH (ref 70–99)
Potassium: 3.2 mmol/L — ABNORMAL LOW (ref 3.5–5.1)
Sodium: 131 mmol/L — ABNORMAL LOW (ref 135–145)
Total Bilirubin: 3.8 mg/dL — ABNORMAL HIGH (ref 0.3–1.2)
Total Protein: 7 g/dL (ref 6.5–8.1)

## 2022-11-02 LAB — LIPASE, BLOOD: Lipase: 59 U/L — ABNORMAL HIGH (ref 11–51)

## 2022-11-02 LAB — SEDIMENTATION RATE: Sed Rate: 79 mm/h — ABNORMAL HIGH (ref 0–16)

## 2022-11-02 MED ORDER — POTASSIUM CHLORIDE CRYS ER 20 MEQ PO TBCR: 40.0000 meq | EXTENDED_RELEASE_TABLET | Freq: Every day | ORAL | 0 refills | Status: DC

## 2022-11-02 MED ORDER — ESCITALOPRAM OXALATE 5 MG PO TABS: 5.0000 mg | ORAL_TABLET | Freq: Every day | ORAL | 3 refills | Status: DC

## 2022-11-02 NOTE — Discharge Summary (Signed)
Physician Discharge Summary  Reymundo Vogan HYQ:[id number] DOB: 1980-09-22 DOA: 10/28/2022  PCP: Sharon Seller, NP  Admit date: 10/28/2022 Discharge date: 11/02/2022  Time spent: 30 minutes  Recommendations for Outpatient Follow-up:  Will need outpatient follow-up of autoimmune workup ANA RF etc. etc. 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  Autoimmune disease   Please see below for itemized issues addressed in HOpsital- refer to other progress notes for clarity if needed  Discharge Condition: Improved  Diet recommendation: Heart healthy  Filed Weights   10/28/22 2034  Weight: 78.9 kg    History of present illness:  42 year old black male, drinks 1/5 of brown liquor daily Seizure disorder reflux?  Cold agglutinin disease HTN anxiety history of EtOH with prior EtOH seizures Last hospitalization 7/22-7/24/2024 with breakthrough seizures and probable alcoholic pancreatitis-echo done for transient A-fib at that time EF 70-75%   Came to ED feeling like he was "about to have a seizure" he typically gets some mild he does not drink--- is feeling nauseous having abdominal pain additionally AST 815 ALT 390 alk phos 254 bilirubin 5.2--- triglycerides >5000 Autoimmune workup was performed it was unclear that he had cold agglutinin disease  US liver no obstruction just fatty deposition Platelets 70s to 100   Started to spike fevers evening  of 9/14 without overt source blood cultures have been negative  Hospital Course:  SIRS physiology without sepsis at discharge Tmax 102 overnight 9/15 Etiology unclear COVID-negative no urinary symptoms no diarrhea no cough Was secondary to probable pancreatitis as his lipase was slightly up For completion follow-up outpatient ESR CRP ANA RF    Alcoholic transaminitis secondary to alcoholic hepatitis with transaminases in the 800s on arrival dropping down to the 200s and bilirubin dropping to the 3 range from 7 Alcohol abuse disorder Stayed on the protocol for withdrawal for 2 days and discontinued on 9/16 and he is stable Feels like alcoholic hepatitis/pancreatitis might be the cause for him having both abdominal pain and fevers He is not bleeding so I do not think he needs any emergent scope   Severe Hypertriglyceridemia Has been told previously he has high triglycerides but is not taking medication Will add fenofibrate and he may need follow-up with either lipid clinic or PCP   ?  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 x-ray was normal on admission   Hypokalemia, hyponatremia As well as hypomagnesemia Will send home on a little bit of potassium replacement he has been encouraged to cut back on drinking alcohol   Prior A-fib status post cardioversion 2019 Continue metoprolol XL 50 daily   Generalized anxiety disorder Resume Lexapro 10   Reflux Resume Protonix 40 daily  Discharge Exam: Vitals:   11/02/22 0525 11/02/22 0904  BP: 127/79 113/72  Pulse: 86 88  Resp: 16 18  Temp: 98.9 F (37.2 C) 98.8 F (37.1 C)  SpO2: 100% 99%    Subj on day of d/c  Awake coherent pleasant no distress no pain no fever  General Exam on discharge  EOMI NCAT no focal deficit Chest is clear no wheeze rales rhonchi ROM intact Abdomen soft no rebound  Discharge Instructions   Discharge Instructions     Diet - low sodium heart healthy   Complete by: As directed     Discharge instructions   Complete by: As directed    Please take your Keppra as has been previously prescribed and do not miss dosing We have resumed at your request your Lexapro at a lower dose Please stop drinking You had some fevers during the hospital stay which were felt to be secondary to pancreatitis however to be complete we ordered a bunch of other labs additionally to rule out any rheumatological process although I think that we will probably be unlikely I do not think that you need to stay in the hospital for these results to come back but you should check your MyChart and follow-up with your primary care physician if these are abnormal Please stop drinking We have given you letters and 2 of them can be printed out for you for both of your jobs Best of luck   Increase activity slowly   Complete by: As directed       Allergies as of 11/02/2022   No Known Allergies      Medication List     TAKE these medications    amLODipine 10 MG tablet Commonly known as: NORVASC TAKE 1 TABLET BY MOUTH EVERY DAY   escitalopram 5 MG tablet Commonly known as: LEXAPRO Take 1 tablet (5 mg total) by mouth daily. Start taking on: November 03, 2022 What changed:  medication strength how much to take additional instructions   folic acid 1 MG tablet Commonly known as: FOLVITE Take 1 tablet (1 mg total) by mouth daily. What changed: when to take this   levETIRAcetam 750 MG tablet Commonly known as: KEPPRA Take 1 tablet (750 mg total) by mouth 2 (two) times daily.   metoprolol succinate 50 MG 24 hr tablet Commonly known as: TOPROL-XL TAKE 1 TABLET BY MOUTH EVERY DAY   pantoprazole 40 MG tablet Commonly known as: PROTONIX TAKE 1 TABLET BY MOUTH EVERY DAY   potassium chloride SA 20 MEQ tablet Commonly known as: KLOR-CON M Take 2 tablets (40 mEq total) by mouth daily.       No Known Allergies    The results of significant diagnostics from this hospitalization  (including imaging, microbiology, ancillary and laboratory) are listed below for reference.    Significant Diagnostic Studies: CT CHEST ABDOMEN PELVIS W CONTRAST  Result Date: 11/01/2022 CLINICAL DATA:  Sepsis.  Fevers. EXAM: CT CHEST, ABDOMEN, AND PELVIS WITH CONTRAST TECHNIQUE: Multidetector CT imaging of the chest, abdomen and pelvis was performed following the standard protocol during bolus administration of intravenous contrast. RADIATION DOSE REDUCTION: This exam was performed according to the departmental dose-optimization program which includes automated exposure control, adjustment of the mA and/or kV according to patient size and/or use of iterative reconstruction technique. CONTRAST:  75mL OMNIPAQUE IOHEXOL 350 MG/ML SOLN COMPARISON:  Abdominopelvic CT 09/06/2022, chest abdomen pelvis CT 10/21/2021 FINDINGS: CT CHEST FINDINGS Cardiovascular: No acute vascular findings. The heart is normal in size. No central pulmonary embolus on this exam not tailored to pulmonary artery assessment. No pericardial effusion. Mediastinum/Nodes: No mediastinal or hilar adenopathy. Patulous distal esophagus with minimal wall thickening at the gastroesophageal junction. Small amount of intraluminal fluid in the mid esophagus. Lungs/Pleura: No  pneumonia or focal airspace disease. No features of pulmonary edema. No pleural effusion. Trachea and central airways are clear. Musculoskeletal: Chronic loss of height of multiple thoracic vertebral bodies. No acute osseous findings. No chest wall soft tissue abnormalities. CT ABDOMEN PELVIS FINDINGS Hepatobiliary: Hepatic steatosis. The liver is enlarged spanning 20.9 cm cranial caudal. No focal hepatic abnormality. Gallbladder physiologically distended, no calcified stone. No biliary dilatation. Pancreas: Mild peripancreatic fat stranding primarily about the distal body and tail, for example series 3 images 58 through 60. Homogeneous pancreatic enhancement. No pancreatic necrosis.  No ductal dilatation. No peripancreatic collection no evidence of pancreatic mass. Spleen: Normal in size without focal abnormality. Adrenals/Urinary Tract: No adrenal nodule. No hydronephrosis, renal calculi or focal renal abnormality. Urinary bladder is only minimally distended, mild bladder wall thickening. Stomach/Bowel: Minimal hiatal hernia. Small posterior gastric diverticulum. No gastric wall thickening. No small bowel obstruction or inflammation. Small to moderate colonic stool burden. The appendix is elongated and courses into the pelvis. No appendicitis. Vascular/Lymphatic: Minimal aortic atherosclerosis. No acute vascular findings. The portal vein is patent. The splenic vein is patent. No abdominopelvic adenopathy. Reproductive: Unremarkable prostate. Other: No ascites or focal fluid collection.  No free air. Musculoskeletal: Mild L1 superior endplate compression deformity is chronic. No acute osseous findings. IMPRESSION: 1. Mild peripancreatic fat stranding primarily about the distal body and tail, typical of acute pancreatitis. No evidence of pancreatic necrosis or peripancreatic collection. 2. Mild bladder wall thickening, may be secondary to nondistention, however recommend correlation with urinalysis to exclude urinary tract infection. 3. Patulous esophagus with a small amount of intraluminal fluid, may represent esophageal dysmotility or reflux. 4. Hepatomegaly and hepatic steatosis. Electronically Signed   By: Narda Rutherford M.D.   On: 11/01/2022 17:14   DG CHEST PORT 1 VIEW  Result Date: 10/29/2022 CLINICAL DATA:  Cold agglutinin disease (HCC). EXAM: PORTABLE CHEST 1 VIEW COMPARISON:  Chest radiograph 04/08/2022 FINDINGS: The cardiomediastinal silhouette is unchanged with normal heart size. Lung volumes are low. No confluent airspace opacity, edema, sizable pleural effusion, or pneumothorax is identified. No acute osseous abnormality is seen. IMPRESSION: No active disease. Electronically  Signed   By: Sebastian Ache M.D.   On: 10/29/2022 15:48   US Abdomen Limited RUQ (LIVER/GB)  Result Date: 10/29/2022 CLINICAL DATA:  Right upper quadrant pain. EXAM: ULTRASOUND ABDOMEN LIMITED RIGHT UPPER QUADRANT COMPARISON:  10/22/2021 FINDINGS: Gallbladder: No gallstones or wall thickening visualized. No sonographic Murphy sign noted by sonographer. Common bile duct: Diameter: 4 mm Liver: Increased echogenicity of liver parenchyma suggests fatty deposition. Portal vein is patent on color Doppler imaging with normal direction of blood flow towards the liver. Other: None. IMPRESSION: 1. No evidence for cholelithiasis or biliary dilatation. 2. Increased echogenicity of liver parenchyma suggests fatty deposition. Electronically Signed   By: Kennith Center M.D.   On: 10/29/2022 07:50    Microbiology: Recent Results (from the past 240 hour(s))  Culture, blood (Routine X 2) w Reflex to ID Panel     Status: None (Preliminary result)   Collection Time: 10/30/22 11:38 PM   Specimen: BLOOD RIGHT ARM  Result Value Ref Range Status   Specimen Description BLOOD RIGHT ARM  Final   Special Requests   Final    BOTTLES DRAWN AEROBIC AND ANAEROBIC Blood Culture adequate volume   Culture   Final    NO GROWTH 2 DAYS Performed at [facility], [address], [place], [place] [address]    Report Status PENDING  Incomplete  Culture,  blood (Routine X 2) w Reflex to ID Panel     Status: None (Preliminary result)   Collection Time: 10/30/22 11:38 PM   Specimen: BLOOD RIGHT ARM  Result Value Ref Range Status   Specimen Description BLOOD RIGHT ARM  Final   Special Requests   Final    BOTTLES DRAWN AEROBIC AND ANAEROBIC Blood Culture adequate volume   Culture   Final    NO GROWTH 2 DAYS Performed at [facility], [address] [place], [place] [address]    Report Status PENDING  Incomplete  SARS Coronavirus 2 by RT PCR (hospital order, performed in [facility] hospital lab) *cepheid single result  test* Anterior Nasal Swab     Status: None   Collection Time: 10/30/22 11:46 PM   Specimen: Anterior Nasal Swab  Result Value Ref Range Status   SARS Coronavirus 2 by RT PCR NEGATIVE NEGATIVE Final    Comment: Performed at [facility], [address] [place], [place] [address]     Labs: Basic Metabolic Panel: Recent Labs  Lab 10/29/22 1854 10/30/22 0858 10/31/22 0713 11/01/22 0730 11/01/22 1741 11/02/22 0911  NA 132* 129* 125* 130*  --  131*  K 3.9 3.4* 3.4* 3.6  --  3.2*  CL 93* 94* 92* 94*  --  96*  CO2 19* 21* 21* 25  --  21*  GLUCOSE 88 96 105* 103*  --  135*  BUN 9 <5* <5* <5*  --  6  CREATININE 1.04 1.06 1.02 1.13  --  1.04  CALCIUM 8.4* 8.2* 8.2* 8.7*  --  8.4*  MG 1.2*  --   --   --  1.7  --    Liver Function Tests: Recent Labs  Lab 10/29/22 1854 10/30/22 0858 10/31/22 0713 11/01/22 0730 11/02/22 0911  AST 901* 676* 333* 156* 165*  ALT 354* 309* 242* 172* 147*  ALKPHOS 244* 236* 261* 266* 255*  BILITOT 7.0* 7.5* 6.9* 5.9* 3.8*  PROT 6.7 6.4* 6.7 7.3 7.0  ALBUMIN 3.0* 2.9* 2.9* 2.8* 2.8*   Recent Labs  Lab 10/29/22 0322 11/02/22 0911  LIPASE 47 59*   No results for input(s): "AMMONIA" in the last 168 hours. CBC: Recent Labs  Lab 10/28/22 2141 10/29/22 0503 10/29/22 1854 10/30/22 0644 10/31/22 0713 11/01/22 0730 11/02/22 0911  WBC 2.8*  --  3.9* 3.3* 5.5 9.4 7.0  NEUTROABS 1.0*  --  2.7  --  4.4 7.1 5.1  HGB 13.5  --  11.7* 11.3* 11.2* 11.1* 10.3*  HCT Unable to determine due to a cold agglutinin  --  31.7* 30.5* 32.4* 32.5* 29.8*  MCV Unable to determine due to a cold agglutinin  --  86.6 87.1 89.5 89.3 91.7  PLT 103*   < > 76* 62* 52* 61* 117*   < > = values in this interval not displayed.   Cardiac Enzymes: Recent Labs  Lab 10/29/22 1854  CKTOTAL 240   BNP: BNP (last 3 results) No results for input(s): "BNP" in the last 8760 hours.  ProBNP (last 3 results) No results for input(s): "PROBNP" in the last 8760  hours.  CBG: No results for input(s): "GLUCAP" in the last 168 hours.     Signed:  Rhetta Mura MD   [facility] 11/02/2022, 11:43 AM

## 2022-11-02 NOTE — TOC Transition Note (Signed)
Transition of Care [facility]) - CM/SW Discharge Note   Patient Details  Name: Devon Hanson MRN: [id number] Date of Birth: 01-28-1981  [facility] CM/SW Contact:  Kermit Balo, RN Phone Number: 11/02/2022, 12:07 PM   Clinical Narrative:    Pt is discharging home with self care. Pt is able to afford his d/c medications. Pt has transportation home.   Final next level of care: Home/Self Care Barriers to Discharge: Inadequate or no insurance, Barriers Unresolved (comment)   Patient Goals and CMS Choice      Discharge Placement                         Discharge Plan and Services Additional resources added to the After Visit Summary for     Discharge Planning Services: CM Consult                                 Social Determinants of Health (SDOH) Interventions SDOH Screenings   Food Insecurity: No Food Insecurity (10/30/2022)  Housing: Low Risk  (10/30/2022)  Transportation Needs: No Transportation Needs (10/30/2022)  Utilities: Not At Risk (10/30/2022)  Depression (PHQ2-9): Low Risk  (04/19/2022)  Social Connections: Unknown (06/17/2021)   Received from [facility], [facility]  Tobacco Use: Low Risk  (10/28/2022)     Readmission Risk Interventions    02/08/2022    2:19 PM  Readmission Risk Prevention Plan  Transportation Screening Complete  PCP or Specialist Appt within 5-7 Days Complete  Home Care Screening Complete  Medication Review (RN CM) Complete

## 2022-11-02 NOTE — Plan of Care (Signed)

## 2022-11-02 NOTE — Plan of Care (Signed)
1301H: patient discharged via wheelchair with hospital transport assist. Will take [place] taxi. AVS, discharge medications and appointments given and explained to the patient.

## 2022-11-03 ENCOUNTER — Telehealth: Payer: Self-pay

## 2022-11-03 NOTE — Transitions of Care (Post Inpatient/ED Visit) (Signed)
11/03/2022  Name: Devon Hanson MRN: [id number] DOB: March 04, 1980  Today's TOC FU Call Status: Today's TOC FU Call Status:: Unsuccessful Call (1st Attempt) Unsuccessful Call (1st Attempt) Date: 11/03/22  Attempted to reach the patient regarding the most recent Inpatient/ED visit. Called patient and no answer. Voicemail was left with office call back number.    Follow Up Plan: Additional outreach attempts will be made to reach the patient to complete the Transitions of Care (Post Inpatient/ED visit) call.   Signature : Persephanie Laatsch.D/RMA

## 2022-11-04 LAB — HAPTOGLOBIN: Haptoglobin: 10 mg/dL — ABNORMAL LOW (ref 17–317)

## 2022-11-05 LAB — CULTURE, BLOOD (ROUTINE X 2)
Culture: NO GROWTH
Culture: NO GROWTH
Special Requests: ADEQUATE
Special Requests: ADEQUATE

## 2022-11-08 ENCOUNTER — Ambulatory Visit: Payer: Self-pay | Admitting: Nurse Practitioner

## 2022-11-08 NOTE — Transitions of Care (Post Inpatient/ED Visit) (Signed)
11/08/2022  Name: Devon Hanson MRN: [id number] DOB: 07-06-1980  Today's TOC FU Call Status: Today's TOC FU Call Status:: Unsuccessful Call (2nd Attempt) Unsuccessful Call (1st Attempt) Date: 11/03/22 Unsuccessful Call (2nd Attempt) Date: 11/08/22  Attempted to reach the patient regarding the most recent Inpatient/ED visit. Called patient and no answer. Voicemail was left with office call back number.    Follow Up Plan: Additional outreach attempts will be made to reach the patient to complete the Transitions of Care (Post Inpatient/ED visit) call.   Signature : Taige Housman.D/RMA

## 2022-11-12 NOTE — Transitions of Care (Post Inpatient/ED Visit) (Signed)
   11/12/2022  Name: Devon Hanson MRN: [id number] DOB: Mar 08, 1980  Today's TOC FU Call Status: Today's TOC FU Call Status:: Unsuccessful Call (3rd Attempt) Unsuccessful Call (1st Attempt) Date: 11/03/22 Unsuccessful Call (2nd Attempt) Date: 11/08/22 Unsuccessful Call (3rd Attempt) Date: 11/12/22  Attempted to reach the patient regarding the most recent Inpatient/ED visit. Patient states he's having issues with his insurance.   Follow Up Plan: No further outreach attempts will be made at this time. We have been unable to contact the patient.  Signature : Minnie Shi.D/RMA

## 2022-11-15 ENCOUNTER — Ambulatory Visit: Payer: Self-pay | Admitting: Nurse Practitioner

## 2022-11-29 ENCOUNTER — Ambulatory Visit: Payer: Self-pay | Admitting: Nurse Practitioner

## 2022-12-16 ENCOUNTER — Encounter (HOSPITAL_COMMUNITY): Payer: Self-pay | Admitting: Student

## 2022-12-16 ENCOUNTER — Other Ambulatory Visit: Payer: Self-pay

## 2022-12-16 ENCOUNTER — Observation Stay (HOSPITAL_COMMUNITY)
Admission: EM | Admit: 2022-12-16 | Discharge: 2022-12-18 | Disposition: A | Discharge status: Home / Self Care | Payer: Self-pay | Attending: Student | Admitting: Student

## 2022-12-16 ENCOUNTER — Emergency Department (HOSPITAL_COMMUNITY): Payer: Self-pay

## 2022-12-16 DIAGNOSIS — F32A Depression, unspecified: Secondary | ICD-10-CM | POA: Insufficient documentation

## 2022-12-16 DIAGNOSIS — E871 Hypo-osmolality and hyponatremia: Secondary | ICD-10-CM | POA: Insufficient documentation

## 2022-12-16 DIAGNOSIS — F1010 Alcohol abuse, uncomplicated: Secondary | ICD-10-CM

## 2022-12-16 DIAGNOSIS — F1090 Alcohol use, unspecified, uncomplicated: Secondary | ICD-10-CM | POA: Diagnosis present

## 2022-12-16 DIAGNOSIS — I10 Essential (primary) hypertension: Secondary | ICD-10-CM | POA: Insufficient documentation

## 2022-12-16 DIAGNOSIS — R110 Nausea: Secondary | ICD-10-CM | POA: Insufficient documentation

## 2022-12-16 DIAGNOSIS — Z23 Encounter for immunization: Secondary | ICD-10-CM | POA: Insufficient documentation

## 2022-12-16 DIAGNOSIS — I480 Paroxysmal atrial fibrillation: Secondary | ICD-10-CM | POA: Insufficient documentation

## 2022-12-16 DIAGNOSIS — R7401 Elevation of levels of liver transaminase levels: Principal | ICD-10-CM

## 2022-12-16 DIAGNOSIS — F10939 Alcohol use, unspecified with withdrawal, unspecified: Secondary | ICD-10-CM

## 2022-12-16 DIAGNOSIS — E876 Hypokalemia: Secondary | ICD-10-CM | POA: Insufficient documentation

## 2022-12-16 DIAGNOSIS — Z79899 Other long term (current) drug therapy: Secondary | ICD-10-CM | POA: Insufficient documentation

## 2022-12-16 DIAGNOSIS — K7010 Alcoholic hepatitis without ascites: Principal | ICD-10-CM | POA: Insufficient documentation

## 2022-12-16 DIAGNOSIS — R569 Unspecified convulsions: Secondary | ICD-10-CM

## 2022-12-16 DIAGNOSIS — F10930 Alcohol use, unspecified with withdrawal, uncomplicated: Secondary | ICD-10-CM

## 2022-12-16 DIAGNOSIS — F419 Anxiety disorder, unspecified: Secondary | ICD-10-CM

## 2022-12-16 DIAGNOSIS — R109 Unspecified abdominal pain: Secondary | ICD-10-CM | POA: Insufficient documentation

## 2022-12-16 LAB — COMPREHENSIVE METABOLIC PANEL
ALT: 1292 U/L — ABNORMAL HIGH (ref 0–44)
AST: 2362 U/L — ABNORMAL HIGH (ref 15–41)
Albumin: 4 g/dL (ref 3.5–5.0)
Alkaline Phosphatase: 94 U/L (ref 38–126)
Anion gap: 11 (ref 5–15)
BUN: 8 mg/dL (ref 6–20)
CO2: 21 mmol/L — ABNORMAL LOW (ref 22–32)
Calcium: 8.5 mg/dL — ABNORMAL LOW (ref 8.9–10.3)
Chloride: 104 mmol/L (ref 98–111)
Creatinine, Ser: 0.8 mg/dL (ref 0.61–1.24)
GFR, Estimated: >60 mL/min (ref >60)
Glucose, Bld: 132 mg/dL — ABNORMAL HIGH (ref 70–99)
Potassium: 3.3 mmol/L — ABNORMAL LOW (ref 3.5–5.1)
Sodium: 136 mmol/L (ref 135–145)
Total Bilirubin: 1.4 mg/dL — ABNORMAL HIGH (ref 0.3–1.2)
Total Protein: 8.2 g/dL — ABNORMAL HIGH (ref 6.5–8.1)

## 2022-12-16 LAB — ACETAMINOPHEN LEVEL: Acetaminophen (Tylenol), Serum: <10 ug/mL — ABNORMAL LOW (ref 10–30)

## 2022-12-16 LAB — CBC WITH DIFFERENTIAL/PLATELET
Abs Immature Granulocytes: 0.01 10*3/uL (ref 0.00–0.07)
Basophils Absolute: 0.1 10*3/uL (ref 0.0–0.1)
Basophils Relative: 2 %
Eosinophils Absolute: 0.1 10*3/uL (ref 0.0–0.5)
Eosinophils Relative: 2 %
HCT: 44.5 % (ref 39.0–52.0)
Hemoglobin: 15.8 g/dL (ref 13.0–17.0)
Immature Granulocytes: 0 %
Lymphocytes Relative: 29 %
Lymphs Abs: 1.3 10*3/uL (ref 0.7–4.0)
MCH: 34.3 pg — ABNORMAL HIGH (ref 26.0–34.0)
MCHC: 35.5 g/dL (ref 30.0–36.0)
MCV: 96.5 fL (ref 80.0–100.0)
Monocytes Absolute: 0.7 10*3/uL (ref 0.1–1.0)
Monocytes Relative: 16 %
Neutro Abs: 2.2 10*3/uL (ref 1.7–7.7)
Neutrophils Relative %: 51 %
Platelets: 365 10*3/uL (ref 150–400)
RBC: 4.61 MIL/uL (ref 4.22–5.81)
RDW: 11.9 % (ref 11.5–15.5)
WBC: 4.4 10*3/uL (ref 4.0–10.5)
nRBC: 0 % (ref 0.0–0.2)

## 2022-12-16 LAB — PROTIME-INR
INR: 1.5 — ABNORMAL HIGH (ref 0.8–1.2)
Prothrombin Time: 18 s — ABNORMAL HIGH (ref 11.4–15.2)

## 2022-12-16 LAB — LIPASE, BLOOD: Lipase: 37 U/L (ref 11–51)

## 2022-12-16 LAB — CK: Total CK: 303 U/L (ref 49–397)

## 2022-12-16 LAB — MAGNESIUM: Magnesium: 1.8 mg/dL (ref 1.7–2.4)

## 2022-12-16 LAB — ETHANOL: Alcohol, Ethyl (B): 340 mg/dL (ref <10)

## 2022-12-16 LAB — PHOSPHORUS: Phosphorus: 1.9 mg/dL — ABNORMAL LOW (ref 2.5–4.6)

## 2022-12-16 MED ORDER — THIAMINE HCL 100 MG/ML IJ SOLN
100.0000 mg | Freq: Every day | INTRAMUSCULAR | Status: DC
  Filled 2022-12-16: qty 2

## 2022-12-16 MED ORDER — ONDANSETRON 4 MG PO TBDP
4.0000 mg | ORAL_TABLET | Freq: Once | ORAL | Status: AC
  Administered 2022-12-16: 4 mg via ORAL
  Filled 2022-12-16: qty 1

## 2022-12-16 MED ORDER — PANTOPRAZOLE SODIUM 40 MG PO TBEC
40.0000 mg | DELAYED_RELEASE_TABLET | Freq: Every day | ORAL | Status: DC
Start: 2022-12-16 — End: 2022-12-18
  Administered 2022-12-16 – 2022-12-18 (×3): 40 mg via ORAL
  Filled 2022-12-16 (×3): qty 1

## 2022-12-16 MED ORDER — ONDANSETRON HCL 4 MG/2ML IJ SOLN: 4.0000 mg | Freq: Four times a day (QID) | INTRAMUSCULAR | Status: DC | PRN

## 2022-12-16 MED ORDER — LORAZEPAM 1 MG PO TABS: 1.0000 mg | ORAL_TABLET | ORAL | Status: DC | PRN

## 2022-12-16 MED ORDER — ADULT MULTIVITAMIN W/MINERALS CH
1.0000 | ORAL_TABLET | Freq: Every day | ORAL | Status: DC
  Administered 2022-12-16 – 2022-12-18 (×3): 1 via ORAL
  Filled 2022-12-16 (×3): qty 1

## 2022-12-16 MED ORDER — THIAMINE MONONITRATE 100 MG PO TABS
100.0000 mg | ORAL_TABLET | Freq: Every day | ORAL | Status: DC
  Administered 2022-12-16 – 2022-12-18 (×3): 100 mg via ORAL
  Filled 2022-12-16 (×3): qty 1

## 2022-12-16 MED ORDER — FOLIC ACID 1 MG PO TABS
1.0000 mg | ORAL_TABLET | Freq: Every day | ORAL | Status: DC
  Administered 2022-12-16 – 2022-12-18 (×3): 1 mg via ORAL
  Filled 2022-12-16 (×3): qty 1

## 2022-12-16 MED ORDER — ESCITALOPRAM OXALATE 10 MG PO TABS
5.0000 mg | ORAL_TABLET | Freq: Every day | ORAL | Status: DC
  Administered 2022-12-16 – 2022-12-18 (×3): 5 mg via ORAL
  Filled 2022-12-16 (×3): qty 1

## 2022-12-16 MED ORDER — METOPROLOL SUCCINATE ER 50 MG PO TB24
50.0000 mg | ORAL_TABLET | Freq: Every day | ORAL | Status: DC
  Administered 2022-12-17 – 2022-12-18 (×2): 50 mg via ORAL
  Filled 2022-12-16 (×2): qty 1

## 2022-12-16 MED ORDER — AMLODIPINE BESYLATE 10 MG PO TABS
10.0000 mg | ORAL_TABLET | Freq: Every day | ORAL | Status: DC
Start: 2022-12-16 — End: 2022-12-18
  Administered 2022-12-16 – 2022-12-18 (×3): 10 mg via ORAL
  Filled 2022-12-16: qty 2
  Filled 2022-12-16 (×2): qty 1

## 2022-12-16 MED ORDER — POTASSIUM CHLORIDE CRYS ER 20 MEQ PO TBCR
40.0000 meq | EXTENDED_RELEASE_TABLET | ORAL | Status: AC
  Administered 2022-12-16 (×2): 40 meq via ORAL
  Filled 2022-12-16 (×2): qty 2

## 2022-12-16 MED ORDER — LEVETIRACETAM 750 MG PO TABS
750.0000 mg | ORAL_TABLET | Freq: Two times a day (BID) | ORAL | Status: DC
  Administered 2022-12-16 – 2022-12-18 (×4): 750 mg via ORAL
  Filled 2022-12-16 (×5): qty 1
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
## 2022-12-16 NOTE — ED Provider Triage Note (Addendum)
Emergency Medicine Provider Triage Evaluation Note  Devon Hanson , a 42 y.o. male  was evaluated in triage.  Pt complains of generalized abdominal pain, nausea, depression since yesterday.  He reports that he has been drinking 2-3 beers this morning due to depression and being fired wrongfully for his job yesterday.  Review of Systems  Positive: Generalized abdominal pain, nausea, depression Negative: No vomiting, diarrhea, SI, HI, self harm  Physical Exam  BP (!) 135/99 (BP Location: Right Arm)   Pulse 88   Temp 98.6 F (37 C) (Oral)   Resp 15   Ht 5\' 8"  (1.727 m)   Wt 78.9 kg   SpO2 97%   BMI 26.45 kg/m  Gen:   Awake, no distress   Resp:  Normal effort  MSK:   Moves extremities without difficulty  Other:  No abdominal tenderness  Medical Decision Making  Medically screening exam initiated at 12:32 PM.  Appropriate orders placed.  Tasha Bussiere was informed that the remainder of the evaluation will be completed by another provider, this initial triage assessment does not replace that evaluation, and the importance of remaining in the ED until their evaluation is complete.  Labwork ordered     Judithann Sheen, PA 12/16/22 1233

## 2022-12-16 NOTE — ED Provider Notes (Signed)
[place] EMERGENCY DEPARTMENT AT [facility] Provider Note   CSN: [id number] Arrival date & time: 12/16/22  1147     History  Chief Complaint  Patient presents with   Depression    Devon Hanson is a 42 y.o. male with past medical history of hypertension, pancreatitis, seizure (treated currently with Keppra), alcohol abuse, PAF, anxiety, depression presents to emergency department via EMS for evaluation of generalized abdominal pain, nausea, depression that started yesterday following being fired from his job.  He is also currently going through divorce and states that he is under a lot of stress.  He reports that he called EMS when he was concerned that he was can have a seizure however he has not had a seizure.   This morning he had "2-3 beers" then had generalized abdominal pain and nausea following.  He reports that he has not had an appetite and has not eaten since yesterday. he does not currently complain of abdominal pain.  He denies fevers, vomiting, diarrhea, SI, HI, self-harm, tremors, anxiety, sweats, hallucinations.   Depression Associated symptoms include abdominal pain. Pertinent negatives include no chest pain, no headaches and no shortness of breath.       Home Medications Prior to Admission medications   Medication Sig Start Date End Date Taking? Authorizing Provider  amLODipine (NORVASC) 10 MG tablet TAKE 1 TABLET BY MOUTH EVERY DAY 09/17/22  Yes Sharon Seller, NP  escitalopram (LEXAPRO) 5 MG tablet Take 1 tablet (5 mg total) by mouth daily. 11/03/22  Yes Rhetta Mura, MD  levETIRAcetam (KEPPRA) 750 MG tablet Take 1 tablet (750 mg total) by mouth 2 (two) times daily. 05/12/22  Yes Sater, Pearletha Furl, MD  metoprolol succinate (TOPROL-XL) 50 MG 24 hr tablet TAKE 1 TABLET BY MOUTH EVERY DAY 06/03/22  Yes Sharon Seller, NP  pantoprazole (PROTONIX) 40 MG tablet TAKE 1 TABLET BY MOUTH EVERY DAY 09/02/22  Yes Sharon Seller, NP       Allergies    Patient has no known allergies.    Review of Systems   Review of Systems  Constitutional:  Negative for chills, fatigue and fever.  Respiratory:  Negative for cough, chest tightness, shortness of breath and wheezing.   Cardiovascular:  Negative for chest pain, palpitations and leg swelling.  Gastrointestinal:  Positive for abdominal pain and nausea. Negative for constipation, diarrhea and vomiting.  Genitourinary: Negative.   Neurological:  Negative for dizziness, seizures, weakness, light-headedness, numbness and headaches.  Psychiatric/Behavioral:  Positive for depression.     Physical Exam Updated Vital Signs BP (!) 135/99 (BP Location: Right Arm)   Pulse 88   Temp 98.6 F (37 C) (Oral)   Resp 15   Ht 5\' 8"  (1.727 m)   Wt 78.9 kg   SpO2 97%   BMI 26.45 kg/m  Physical Exam Vitals and nursing note reviewed.  Constitutional:      General: He is not in acute distress.    Appearance: Normal appearance. He is not ill-appearing.  HENT:     Head: Normocephalic and atraumatic.  Eyes:     General:        Right eye: No discharge.        Left eye: No discharge.     Conjunctiva/sclera: Conjunctivae normal.  Cardiovascular:     Rate and Rhythm: Normal rate.     Pulses: Normal pulses.     Heart sounds: Normal heart sounds.  Pulmonary:     Effort: Pulmonary effort is  normal. No respiratory distress.     Breath sounds: Normal breath sounds. No stridor. No wheezing, rhonchi or rales.  Chest:     Chest wall: No tenderness.  Abdominal:     General: Bowel sounds are normal. There is no distension.     Palpations: Abdomen is soft. There is no mass.     Tenderness: There is no abdominal tenderness. There is no right CVA tenderness, left CVA tenderness, guarding or rebound.  Musculoskeletal:     Cervical back: Normal range of motion and neck supple. No rigidity or tenderness.  Skin:    General: Skin is warm.     Coloration: Skin is not jaundiced or pale.   Neurological:     Mental Status: He is alert and oriented to person, place, and time. Mental status is at baseline.     Cranial Nerves: No cranial nerve deficit.     Sensory: No sensory deficit.     Motor: No weakness.     Coordination: Coordination normal.  Psychiatric:        Mood and Affect: Mood normal.        Behavior: Behavior normal.     Comments: + Depression Negative SI, HI, self harm Not anxious appearing     ED Results / Procedures / Treatments   Labs (all labs ordered are listed, but only abnormal results are displayed) Labs Reviewed  COMPREHENSIVE METABOLIC PANEL - Abnormal; Notable for the following components:      Result Value   Potassium 3.3 (*)    CO2 21 (*)    Glucose, Bld 132 (*)    Calcium 8.5 (*)    Total Protein 8.2 (*)    AST 2,362 (*)    ALT 1,292 (*)    Total Bilirubin 1.4 (*)    All other components within normal limits  ETHANOL - Abnormal; Notable for the following components:   Alcohol, Ethyl (B) 340 (*)    All other components within normal limits  CBC WITH DIFFERENTIAL/PLATELET - Abnormal; Notable for the following components:   MCH 34.3 (*)    All other components within normal limits  PROTIME-INR - Abnormal; Notable for the following components:   Prothrombin Time 18.0 (*)    INR 1.5 (*)    All other components within normal limits  LIPASE, BLOOD  RAPID URINE DRUG SCREEN, HOSP PERFORMED  ACETAMINOPHEN LEVEL  CK  MAGNESIUM  PHOSPHORUS    EKG None  Radiology US Abdomen Limited RUQ (LIVER/GB)  Result Date: 12/16/2022 CLINICAL DATA:  Elevated liver function tests. EXAM: ULTRASOUND ABDOMEN LIMITED RIGHT UPPER QUADRANT COMPARISON:  CT 11/01/2022.  Ultrasound 10/29/2022. FINDINGS: Gallbladder: Distended gallbladder. No wall thickening or adjacent fluid. No shadowing stones. Common bile duct: Diameter: 4 mm Liver: Diffusely echogenic hepatic parenchyma consistent with fatty liver infiltration. Portal vein is patent on color Doppler  imaging with normal direction of blood flow towards the liver. Other: None. IMPRESSION: Fatty liver infiltration. Distended gallbladder but no stones or ductal dilatation. Electronically Signed   By: Karen Kays M.D.   On: 12/16/2022 16:56    Procedures Procedures    Medications Ordered in ED Medications  potassium chloride SA (KLOR-CON M) CR tablet 40 mEq (has no administration in time range)  ondansetron (ZOFRAN-ODT) disintegrating tablet 4 mg (4 mg Oral Given 12/16/22 1243)    ED Course/ Medical Decision Making/ A&P  Medical Decision Making Amount and/or Complexity of Data Reviewed Labs: ordered. Radiology: ordered.  Risk Prescription drug management.   Patient presents to the ED for concern of generalized abdominal pain, nausea, depression this involves an extensive number of treatment options, and is a complaint that carries with it a high risk of complications and morbidity.  The differential diagnosis includes gastroenteritis, viral infection   Co morbidities that complicate the patient evaluation  Alcohol abuse   Additional history obtained:  Additional history obtained from EMS, Nursing, Outside Medical Records, and Past Admission   External records from outside source obtained and reviewed including previous admission on 10/28/22 for similar complaints of concern for a seizure following alcohol abuse.   Lab Tests:  I Ordered, and personally interpreted labs.  The pertinent results include:  Ethanol 340, AST 2,362, ALT 1,292. PT 18, INR 1.5   Imaging Studies ordered:  I ordered imaging studies including RUQ [place]  I independently visualized and interpreted imaging which showed fatty liver infiltration and distended gallbladder but no stones or ductal dilation I agree with the radiologist interpretation   Cardiac Monitoring:  The patient was maintained on a cardiac monitor.  I personally viewed and interpreted the cardiac  monitored which showed an underlying rhythm of: NSR with no STE or ischemic changes   Consultations Obtained:  I requested consultation with hospitalist (Dr. Candelaria Stagers),  and discussed lab and imaging findings as well as pertinent plan - they accept patient for admission   Problem List / ED Course:  Transaminitis Alcohol abuse   Reevaluation:  After the interventions noted above, I reevaluated the patient and found that they have :stayed the same    Dispostion:  Patient is resting comfortably in chair.  He does not appear anxious nor have active tremors.  He is acting appropriately.  He complains of generalized abdominal pain and depression following losing his job.  Initially he reports that he had 2-3 beers this morning.  Upon further questioning, patient reports that he has been drinking "since Sunday".  Vital signs WNL.  See HPI.  CIWA is 0 with no obvious signs of alcohol withdrawal.  Patient denies nausea and vomiting following Zofran administration.  He does not have any tremors. He has no complaints of headache, hallucinations. lab work significant for ethanol of 340, transaminitis, elevated PT/INR.  RUQ ultrasound significant for distended gallbladder and fatty liver infiltration. MELD score 13 with 6% 3 month mortality.  After consideration of the diagnostic results and the patients response to treatment, I feel that the patent would benefit from admission for significantly elevated transaminitis. [facility] accepts patient for admission.  Discussed patient with Dr. Suezanne Jacquet who agrees with plan.       Final Clinical Impression(s) / ED Diagnoses Final diagnoses:  Transaminitis  Alcohol abuse    Rx / DC Orders ED Discharge Orders     None         Judithann Sheen, PA 12/16/22 1741    Lonell Grandchild, MD 12/17/22 [phone]

## 2022-12-16 NOTE — ED Notes (Signed)
Transported to [place].
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
## 2022-12-16 NOTE — ED Triage Notes (Signed)
Pt got fired from his job on Wednesday. Called 911 as pt states he does not feel good. Compliant with meds. Hx of seizures. Pt reports increasing depression. No SI/HI.

## 2022-12-17 LAB — CBC
HCT: 41.6 % (ref 39.0–52.0)
Hemoglobin: 14.9 g/dL (ref 13.0–17.0)
MCH: 33.6 pg (ref 26.0–34.0)
MCHC: 35.8 g/dL (ref 30.0–36.0)
MCV: 93.9 fL (ref 80.0–100.0)
Platelets: 266 10*3/uL (ref 150–400)
RBC: 4.43 MIL/uL (ref 4.22–5.81)
RDW: 11.8 % (ref 11.5–15.5)
WBC: 3.8 10*3/uL — ABNORMAL LOW (ref 4.0–10.5)
nRBC: 0 % (ref 0.0–0.2)

## 2022-12-17 LAB — PHOSPHORUS: Phosphorus: 3 mg/dL (ref 2.5–4.6)

## 2022-12-17 LAB — COMPREHENSIVE METABOLIC PANEL
ALT: 1405 U/L — ABNORMAL HIGH (ref 0–44)
AST: 2491 U/L — ABNORMAL HIGH (ref 15–41)
Albumin: 3.7 g/dL (ref 3.5–5.0)
Alkaline Phosphatase: 103 U/L (ref 38–126)
Anion gap: 17 — ABNORMAL HIGH (ref 5–15)
BUN: 8 mg/dL (ref 6–20)
CO2: 19 mmol/L — ABNORMAL LOW (ref 22–32)
Calcium: 8.5 mg/dL — ABNORMAL LOW (ref 8.9–10.3)
Chloride: 96 mmol/L — ABNORMAL LOW (ref 98–111)
Creatinine, Ser: 0.84 mg/dL (ref 0.61–1.24)
GFR, Estimated: >60 mL/min (ref >60)
Glucose, Bld: 86 mg/dL (ref 70–99)
Potassium: 3.6 mmol/L (ref 3.5–5.1)
Sodium: 132 mmol/L — ABNORMAL LOW (ref 135–145)
Total Bilirubin: 2.1 mg/dL — ABNORMAL HIGH (ref 0.3–1.2)
Total Protein: 7.2 g/dL (ref 6.5–8.1)

## 2022-12-17 LAB — RAPID URINE DRUG SCREEN, HOSP PERFORMED
Amphetamines: NOT DETECTED
Barbiturates: NOT DETECTED
Benzodiazepines: NOT DETECTED
Cocaine: NOT DETECTED
Opiates: NOT DETECTED
Tetrahydrocannabinol: NOT DETECTED

## 2022-12-17 LAB — CK: Total CK: 289 U/L (ref 49–397)

## 2022-12-17 LAB — MAGNESIUM: Magnesium: 1.4 mg/dL — ABNORMAL LOW (ref 1.7–2.4)

## 2022-12-17 MED ORDER — POTASSIUM CHLORIDE CRYS ER 20 MEQ PO TBCR
40.0000 meq | EXTENDED_RELEASE_TABLET | Freq: Once | ORAL | Status: AC
  Administered 2022-12-17: 40 meq via ORAL
  Filled 2022-12-17: qty 2

## 2022-12-17 MED ORDER — ORAL CARE MOUTH RINSE: 15.0000 mL | OROMUCOSAL | Status: DC | PRN

## 2022-12-17 MED ORDER — LABETALOL HCL 5 MG/ML IV SOLN
10.0000 mg | INTRAVENOUS | Status: DC | PRN
  Administered 2022-12-17: 10 mg via INTRAVENOUS
  Filled 2022-12-17: qty 4

## 2022-12-17 MED ORDER — INFLUENZA VIRUS VACC SPLIT PF (FLUZONE) 0.5 ML IM SUSY
0.5000 mL | PREFILLED_SYRINGE | INTRAMUSCULAR | Status: AC
  Administered 2022-12-18: 0.5 mL via INTRAMUSCULAR
  Filled 2022-12-17: qty 0.5

## 2022-12-17 MED ORDER — MAGNESIUM SULFATE 4 GM/100ML IV SOLN
4.0000 g | Freq: Once | INTRAVENOUS | Status: AC
  Administered 2022-12-17: 4 g via INTRAVENOUS
  Filled 2022-12-17: qty 100

## 2022-12-17 NOTE — Care Management (Signed)
Transition of Care [facility]) - Inpatient Brief Assessment   Patient Details  Name: Devon Hanson MRN: [id number] Date of Birth: May 26, 1980  Transition of [facility]) CM/SW Contact:    Lockie Pares, RN Phone Number: 12/17/2022, 5:01 PM   Clinical Narrative:  42 yo patient presented with , not feeling well, lipase LFT elevated hx of ETOH. Patient is going through divorce and lost his job.  Patient has PCP but is uninsured at this time. Will need concealing resources and possible MATCH for medications.   TOC will follow for needs  Transition of Care Asessment: Insurance and Status: Selfpay Patient has primary care physician: Yes   Prior level of function:: Independent Prior/Current Home Services: No current home services Social Determinants of Health Reivew: SDOH reviewed no interventions necessary Readmission risk has been reviewed: Yes Transition of care needs: transition of care needs identified, TOC will continue to follow

## 2022-12-17 NOTE — Progress Notes (Signed)
PROGRESS NOTE  Devon Hanson ZOX:[id number] DOB: 08/02/1980   PCP: Sharon Seller, NP  Patient is from: Home.  DOA: 12/16/2022 LOS: 0  Chief complaints Chief Complaint  Patient presents with   Depression     Brief Narrative / Interim history: 42 y.o. male with PMH of EtOH abuse, paroxysmal A-fib not on AC, pancreatitis, seizure, HTN, thoracic and lumbar compression fracture, thrombocytopenia, GERD, anxiety and depression presenting with vague complaints of "not feeling well", abdominal pain and nausea, and admitted for acute alcoholic hepatitis.  He recently lost one of his jobs, and is going through divorce which have been stressful.  He has been drinking about 2 of the 12 ounce beers daily lately. He felt unwell and concerned about alcohol withdrawal seizure and called EMS.   In ED, vital stable.  EtOH level 340.  AST 2362. ALT 1292.  Total bili 1.4.  ALP within normal.  INR 1.4.  Lipase 37.  RUQ [place] with fatty liver.  EKG NSR.  No major withdrawal symptoms.  Subjective: Seen and examined earlier this morning.  No major events overnight of this morning other than difficulty sleeping in the hospital.  Denies abdominal pain.  Some nausea this morning.  Has not eaten breakfast yet.  No withdrawal symptoms.  CIWA score remains low at 0.  Liver enzymes trended up slightly.  Objective: Vitals:   12/16/22 2143 12/17/22 0224 12/17/22 0628 12/17/22 0937  BP: (!) 156/98 120/84 (!) 147/96 (!) 142/100  Pulse: 79 75 79 96  Resp: 18 20 18    Temp: 98.2 F (36.8 C) 98.2 F (36.8 C) 98.4 F (36.9 C)   TempSrc: Oral Oral Oral   SpO2:  96% 96% 95%  Weight: 86.2 kg     Height: 5\' 8"  (1.727 m)       Examination:  GENERAL: No apparent distress.  Nontoxic. HEENT: MMM.  Vision and hearing grossly intact.  NECK: Supple.  No apparent JVD.  RESP:  No IWOB.  Fair aeration bilaterally. CVS:  RRR. Heart sounds normal.  ABD/GI/GU: BS+. Abd soft, NTND.  MSK/EXT:  Moves extremities. No  apparent deformity. No edema.  SKIN: no apparent skin lesion or wound NEURO: Awake, alert and oriented appropriately.  No apparent focal neuro deficit. PSYCH: Calm. Normal affect.   Procedures:  None  Microbiology summarized: None  Assessment and plan: Acute alcoholic hepatitis: LFT and bilirubin trended up some.  CK within normal. Recent Labs  Lab 12/16/22 1200 12/17/22 0622  AST 2,362* 2,491*  ALT 1,292* 1,405*  ALKPHOS 94 103  BILITOT 1.4* 2.1*  PROT 8.2* 7.2  ALBUMIN 4.0 3.7  -Continue monitoring -Avoid hepatotoxins -GI consult if no improvement or worse.  Alcohol abuse: Reports drinking about 2 of the 12 ounce beers daily although his alcohol level is markedly elevated at 340 suggesting more consumption.  Last drink the day of admission.  He has no withdrawal symptoms.  He reports history of alcohol withdrawal seizure in February 2024.  He is on Keppra.  Reports compliance with his Keppra. -Admit to progressive care -CIWA with as needed Ativan -Continue home Keppra -Counseled on alcohol cessation -Thiamine, multivitamin and folic acid -TOC consulted   Essential hypertension: BP slightly elevated. -Continue home amlodipine and metoprolol -Add IV labetalol as needed   History of atrial fibrillation: Currently in sinus rhythm.  Not on anticoagulation -Continue Toprol-XL   Anxiety and depression: Reports depressed mood in the setting of losing job and divorce.  Denies SI/HI/psychosis. -Continue home meds  Hypokalemia/hypomagnesemia/hypophosphatemia -Monitor replenish as appropriate  Hyponatremia: Likely beer potomania. -Monitor   GERD -Continue PPI   Nausea and abdominal pain: Likely due to alcohol.  Abdominal exam benign.  Seems to have resolved. -Advance to regular diet. -Zofran as needed  Body mass index is 28.9 kg/m.           DVT prophylaxis:  SCDs Start: 12/16/22 1900  Code Status: Full code Family Communication: None at bedside Level of  care: Med-Surg Status is: Observation The patient will require care spanning > 2 midnights and should be moved to inpatient because: Due to acute alcoholic hepatitis   Final disposition: Home once medically ready Consultants:  None  55 minutes with more than 50% spent in reviewing records, counseling patient/family and coordinating care.   Sch Meds:  Scheduled Meds:  amLODipine  10 mg Oral Daily   escitalopram  5 mg Oral Daily   folic acid  1 mg Oral Daily   [START ON 12/18/2022] influenza vac split trivalent PF  0.5 mL Intramuscular Tomorrow-1000   levETIRAcetam  750 mg Oral BID   metoprolol succinate  50 mg Oral Daily   multivitamin with minerals  1 tablet Oral Daily   pantoprazole  40 mg Oral Daily   thiamine  100 mg Oral Daily   Or   thiamine  100 mg Intravenous Daily   Continuous Infusions: PRN Meds:.LORazepam, ondansetron (ZOFRAN) IV, mouth rinse  Antimicrobials: Anti-infectives (From admission, onward)    None        I have personally reviewed the following labs and images: CBC: Recent Labs  Lab 12/16/22 1200 12/17/22 0622  WBC 4.4 3.8*  NEUTROABS 2.2  --   HGB 15.8 14.9  HCT 44.5 41.6  MCV 96.5 93.9  PLT 365 266   BMP &GFR Recent Labs  Lab 12/16/22 1200 12/17/22 0622  NA 136 132*  K 3.3* 3.6  CL 104 96*  CO2 21* 19*  GLUCOSE 132* 86  BUN 8 8  CREATININE 0.80 0.84  CALCIUM 8.5* 8.5*  MG 1.8 1.4*  PHOS 1.9* 3.0   Estimated Creatinine Clearance: 124.8 mL/min (by C-G formula based on SCr of 0.84 mg/dL). Liver & Pancreas: Recent Labs  Lab 12/16/22 1200 12/17/22 0622  AST 2,362* 2,491*  ALT 1,292* 1,405*  ALKPHOS 94 103  BILITOT 1.4* 2.1*  PROT 8.2* 7.2  ALBUMIN 4.0 3.7   Recent Labs  Lab 12/16/22 1552  LIPASE 37   No results for input(s): "AMMONIA" in the last 168 hours. Diabetic: No results for input(s): "HGBA1C" in the last 72 hours. No results for input(s): "GLUCAP" in the last 168 hours. Cardiac Enzymes: Recent Labs  Lab  12/16/22 1200 12/17/22 0622  CKTOTAL 303 289   No results for input(s): "PROBNP" in the last 8760 hours. Coagulation Profile: Recent Labs  Lab 12/16/22 1552  INR 1.5*   Thyroid Function Tests: No results for input(s): "TSH", "T4TOTAL", "FREET4", "T3FREE", "THYROIDAB" in the last 72 hours. Lipid Profile: No results for input(s): "CHOL", "HDL", "LDLCALC", "TRIG", "CHOLHDL", "LDLDIRECT" in the last 72 hours. Anemia Panel: No results for input(s): "VITAMINB12", "FOLATE", "FERRITIN", "TIBC", "IRON", "RETICCTPCT" in the last 72 hours. Urine analysis:    Component Value Date/Time   COLORURINE YELLOW 10/29/2022 2155   APPEARANCEUR CLEAR 10/29/2022 2155   LABSPEC 1.010 10/29/2022 2155   PHURINE 6.0 10/29/2022 2155   GLUCOSEU NEGATIVE 10/29/2022 2155   HGBUR TRACE (A) 10/29/2022 2155   BILIRUBINUR MODERATE (A) 10/29/2022 2155   KETONESUR NEGATIVE 10/29/2022 2155  PROTEINUR NEGATIVE 10/29/2022 2155   NITRITE NEGATIVE 10/29/2022 2155   LEUKOCYTESUR NEGATIVE 10/29/2022 2155   Sepsis Labs: Invalid input(s): "PROCALCITONIN", "LACTICIDVEN"  Microbiology: No results found for this or any previous visit (from the past 240 hour(s)).  Radiology Studies: US Abdomen Limited RUQ (LIVER/GB)  Result Date: 12/16/2022 CLINICAL DATA:  Elevated liver function tests. EXAM: ULTRASOUND ABDOMEN LIMITED RIGHT UPPER QUADRANT COMPARISON:  CT 11/01/2022.  Ultrasound 10/29/2022. FINDINGS: Gallbladder: Distended gallbladder. No wall thickening or adjacent fluid. No shadowing stones. Common bile duct: Diameter: 4 mm Liver: Diffusely echogenic hepatic parenchyma consistent with fatty liver infiltration. Portal vein is patent on color Doppler imaging with normal direction of blood flow towards the liver. Other: None. IMPRESSION: Fatty liver infiltration. Distended gallbladder but no stones or ductal dilatation. Electronically Signed   By: Karen Kays M.D.   On: 12/16/2022 16:56      Tal Neer T. [facility]  Hospitalist  If 7PM-7AM, please contact night-coverage [email or web address] 12/17/2022, 12:33 PM

## 2022-12-17 NOTE — Care Management (Deleted)
  Transition of Care [facility]) Screening Note   Patient Details  Name: Osceola Depaz Date of Birth: 1980/05/26   Transition of Care [facility]) CM/SW Contact:    Lockie Pares, RN Phone Number: 12/17/2022, 4:51 PM    Transition of Care Department [facility]) has reviewed patient and no TOC needs have been identified at this time. We will continue to monitor patient advancement through interdisciplinary progression rounds. If new patient transition needs arise, please place a TOC consult.

## 2022-12-17 NOTE — Plan of Care (Signed)

## 2022-12-18 LAB — CBC
HCT: 41 % (ref 39.0–52.0)
Hemoglobin: 15 g/dL (ref 13.0–17.0)
MCH: 33.9 pg (ref 26.0–34.0)
MCHC: 36.6 g/dL — ABNORMAL HIGH (ref 30.0–36.0)
MCV: 92.6 fL (ref 80.0–100.0)
Platelets: 222 10*3/uL (ref 150–400)
RBC: 4.43 MIL/uL (ref 4.22–5.81)
RDW: 11.6 % (ref 11.5–15.5)
WBC: 2.9 10*3/uL — ABNORMAL LOW (ref 4.0–10.5)
nRBC: 0 % (ref 0.0–0.2)

## 2022-12-18 LAB — COMPREHENSIVE METABOLIC PANEL
ALT: 931 U/L — ABNORMAL HIGH (ref 0–44)
AST: 901 U/L — ABNORMAL HIGH (ref 15–41)
Albumin: 3.4 g/dL — ABNORMAL LOW (ref 3.5–5.0)
Alkaline Phosphatase: 121 U/L (ref 38–126)
Anion gap: 11 (ref 5–15)
BUN: <5 mg/dL — ABNORMAL LOW (ref 6–20)
CO2: 22 mmol/L (ref 22–32)
Calcium: 8.8 mg/dL — ABNORMAL LOW (ref 8.9–10.3)
Chloride: 102 mmol/L (ref 98–111)
Creatinine, Ser: 0.84 mg/dL (ref 0.61–1.24)
GFR, Estimated: >60 mL/min (ref >60)
Glucose, Bld: 110 mg/dL — ABNORMAL HIGH (ref 70–99)
Potassium: 3.7 mmol/L (ref 3.5–5.1)
Sodium: 135 mmol/L (ref 135–145)
Total Bilirubin: 1.7 mg/dL — ABNORMAL HIGH (ref 0.3–1.2)
Total Protein: 7.1 g/dL (ref 6.5–8.1)

## 2022-12-18 LAB — PROTIME-INR
INR: 1.1 (ref 0.8–1.2)
Prothrombin Time: 14.2 s (ref 11.4–15.2)

## 2022-12-18 LAB — MAGNESIUM: Magnesium: 2.1 mg/dL (ref 1.7–2.4)

## 2022-12-18 MED ORDER — FOLIC ACID 1 MG PO TABS: 1.0000 mg | ORAL_TABLET | Freq: Every day | ORAL | 1 refills | Status: DC

## 2022-12-18 MED ORDER — VITAMIN B-1 100 MG PO TABS: 100.0000 mg | ORAL_TABLET | Freq: Every day | ORAL | 1 refills | Status: DC

## 2022-12-18 MED ORDER — ADULT MULTIVITAMIN W/MINERALS CH: 1.0000 | ORAL_TABLET | Freq: Every day | ORAL | Status: DC

## 2022-12-18 NOTE — Discharge Summary (Signed)
Physician Discharge Summary  Devon Hanson QMV:[id number] DOB: Nov 12, 1980 DOA: 12/16/2022  PCP: Sharon Seller, NP  Admit date: 12/16/2022 Discharge date: 12/18/2022 Admitted From: Home Disposition: Home Recommendations for Outpatient Follow-up:  Follow up with PCP in 1 week Check CMP and CBC at follow-up Reassess blood pressure and adjust meds as appropriate Please follow up on the following pending results: None  Home Health: Not indicated Equipment/Devices: Not indicated  Discharge Condition: Stable CODE STATUS: Full code  Follow-up Information     Sharon Seller, NP. Schedule an appointment as soon as possible for a visit in 1 week(s).   Specialty: Geriatric Medicine Contact information: [address] [place] [address] [phone]                 Hospital course 42 y.o. male with PMH of EtOH abuse, paroxysmal A-fib not on AC, pancreatitis, seizure, HTN, thoracic and lumbar compression fracture, thrombocytopenia, GERD, anxiety and depression presenting with vague complaints of "not feeling well", abdominal pain and nausea, and admitted for acute alcoholic hepatitis.   He recently lost one of his jobs, and is going through divorce which have been stressful.  He has been drinking about 2 of the 12 ounce beers daily lately. He felt unwell and concerned about alcohol withdrawal seizure and called EMS.    In ED, vital stable.  EtOH level 340.  AST 2362. ALT 1292.  Total bili 1.4.  ALP within normal.  INR 1.4.  Lipase 37.  RUQ [place] with fatty liver.  EKG NSR.  No major withdrawal symptoms.  The next day, feels better but LFT remains elevated with slight uptrend.  On the day of discharge, symptoms resolved.  Significant improvement in LFT.  Discharged home for outpatient follow-up with PCP.  Extensively counseled on the importance of alcohol cessation.  Provided with resources.  Advised to avoid Tylenol as well.  Repeat CMP in 1 to 2 weeks  See  individual problem list below for more.   Problems addressed during this hospitalization Acute alcoholic hepatitis: LFT and bilirubin trended up some.  CK within normal.  RUQ [place] with fatty infiltration and distended gallbladder without stone or obstruction. Recent Labs  Lab 12/16/22 1200 12/17/22 0622 12/18/22 0839  AST 2,362* 2,491* 901*  ALT 1,292* 1,405* 931*  ALKPHOS 94 103 121  BILITOT 1.4* 2.1* 1.7*  PROT 8.2* 7.2 7.1  ALBUMIN 4.0 3.7 3.4*  -Counseled on the importance of alcohol cessation.  Provided with resources. -Advised to avoid Tylenol -Recheck CMP in 1 week   Alcohol abuse: Reports drinking about 2 of the 12 ounce beers daily although his alcohol level is markedly elevated at 340 suggesting more consumption.  Last drink the day of admission.  He has no withdrawal symptoms.  He reports history of alcohol withdrawal seizure in February 2024.  He is on Keppra.  Reports compliance with his Keppra. -Counseled on the importance of alcohol cessation -Continue multivitamin, thiamine and folic acid   Essential hypertension: BP slightly elevated. -Continue home amlodipine and metoprolol -Reassess and adjust meds as appropriate   History of atrial fibrillation: Currently in sinus rhythm.  Not on anticoagulation.  CHA2DS2-VASc score 1 for HTN -Continue Toprol-XL   Anxiety and depression: Reports depressed mood in the setting of losing job and divorce.  Denies SI/HI/psychosis.  Not on meds.   Hypokalemia/hypomagnesemia/hypophosphatemia: Resolved.   Hyponatremia: Resolved.   GERD -Continue PPI   Nausea and abdominal pain: Resolved.  Time spent 35 minutes  Vital signs Vitals:   12/17/22 1951 12/17/22 2152 12/17/22 2320 12/18/22 0621  BP: (!) 146/103 (!) 144/103 (!) 144/99 (!) 129/99  Pulse: (!) 101  (!) 101 100  Temp:    98.6 F (37 C)  Resp:    20  Height:      Weight:      SpO2: 95%   98%  TempSrc:    Oral  BMI (Calculated):         Discharge  exam  GENERAL: No apparent distress.  Nontoxic. HEENT: MMM.  Vision and hearing grossly intact.  NECK: Supple.  No apparent JVD.  RESP:  No IWOB.  Fair aeration bilaterally. CVS:  RRR. Heart sounds normal.  ABD/GI/GU: BS+. Abd soft, NTND.  MSK/EXT:  Moves extremities. No apparent deformity. No edema.  SKIN: no apparent skin lesion or wound NEURO: Awake and alert. Oriented appropriately.  No apparent focal neuro deficit. PSYCH: Calm. Normal affect.   Discharge Instructions Discharge Instructions     Diet - low sodium heart healthy   Complete by: As directed    Discharge instructions   Complete by: As directed    It has been a pleasure taking care of you!  You were hospitalized due to elevated liver enzymes/liver injury from alcohol.  It is very important that you stop drinking alcohol.  Please use resources to help with alcohol cessation.  Avoid Tylenol.  Follow-up with your primary care doctor in 1 to 2 weeks to have your liver enzymes rechecked.  Take your medications as prescribed.   Take care,   Increase activity slowly   Complete by: As directed       Allergies as of 12/18/2022   No Known Allergies      Medication List     TAKE these medications    amLODipine 10 MG tablet Commonly known as: NORVASC TAKE 1 TABLET BY MOUTH EVERY DAY   escitalopram 5 MG tablet Commonly known as: LEXAPRO Take 1 tablet (5 mg total) by mouth daily.   folic acid 1 MG tablet Commonly known as: FOLVITE Take 1 tablet (1 mg total) by mouth daily. Start taking on: December 19, 2022   levETIRAcetam 750 MG tablet Commonly known as: KEPPRA Take 1 tablet (750 mg total) by mouth 2 (two) times daily.   metoprolol succinate 50 MG 24 hr tablet Commonly known as: TOPROL-XL TAKE 1 TABLET BY MOUTH EVERY DAY   multivitamin with minerals Tabs tablet Take 1 tablet by mouth daily. Start taking on: December 19, 2022   pantoprazole 40 MG tablet Commonly known as: PROTONIX TAKE 1 TABLET BY  MOUTH EVERY DAY   thiamine 100 MG tablet Commonly known as: Vitamin B-1 Take 1 tablet (100 mg total) by mouth daily. Start taking on: December 19, 2022        Consultations: None  Procedures/Studies:   US Abdomen Limited RUQ (LIVER/GB)  Result Date: 12/16/2022 CLINICAL DATA:  Elevated liver function tests. EXAM: ULTRASOUND ABDOMEN LIMITED RIGHT UPPER QUADRANT COMPARISON:  CT 11/01/2022.  Ultrasound 10/29/2022. FINDINGS: Gallbladder: Distended gallbladder. No wall thickening or adjacent fluid. No shadowing stones. Common bile duct: Diameter: 4 mm Liver: Diffusely echogenic hepatic parenchyma consistent with fatty liver infiltration. Portal vein is patent on color Doppler imaging with normal direction of blood flow towards the liver. Other: None. IMPRESSION: Fatty liver infiltration. Distended gallbladder but no stones or ductal dilatation. Electronically Signed   By: Karen Kays M.D.   On: 12/16/2022 16:56  The results of significant diagnostics from this hospitalization (including imaging, microbiology, ancillary and laboratory) are listed below for reference.     Microbiology: No results found for this or any previous visit (from the past 240 hour(s)).   Labs:  CBC: Recent Labs  Lab 12/16/22 1200 12/17/22 0622 12/18/22 0350  WBC 4.4 3.8* 2.9*  NEUTROABS 2.2  --   --   HGB 15.8 14.9 15.0  HCT 44.5 41.6 41.0  MCV 96.5 93.9 92.6  PLT 365 266 222   BMP &GFR Recent Labs  Lab 12/16/22 1200 12/17/22 0622 12/18/22 0839  NA 136 132* 135  K 3.3* 3.6 3.7  CL 104 96* 102  CO2 21* 19* 22  GLUCOSE 132* 86 110*  BUN 8 8 <5*  CREATININE 0.80 0.84 0.84  CALCIUM 8.5* 8.5* 8.8*  MG 1.8 1.4* 2.1  PHOS 1.9* 3.0  --    Estimated Creatinine Clearance: 124.8 mL/min (by C-G formula based on SCr of 0.84 mg/dL). Liver & Pancreas: Recent Labs  Lab 12/16/22 1200 12/17/22 0622 12/18/22 0839  AST 2,362* 2,491* 901*  ALT 1,292* 1,405* 931*  ALKPHOS 94 103 121  BILITOT  1.4* 2.1* 1.7*  PROT 8.2* 7.2 7.1  ALBUMIN 4.0 3.7 3.4*   Recent Labs  Lab 12/16/22 1552  LIPASE 37   No results for input(s): "AMMONIA" in the last 168 hours. Diabetic: No results for input(s): "HGBA1C" in the last 72 hours. No results for input(s): "GLUCAP" in the last 168 hours. Cardiac Enzymes: Recent Labs  Lab 12/16/22 1200 12/17/22 0622  CKTOTAL 303 289   No results for input(s): "PROBNP" in the last 8760 hours. Coagulation Profile: Recent Labs  Lab 12/16/22 1552 12/18/22 0350  INR 1.5* 1.1   Thyroid Function Tests: No results for input(s): "TSH", "T4TOTAL", "FREET4", "T3FREE", "THYROIDAB" in the last 72 hours. Lipid Profile: No results for input(s): "CHOL", "HDL", "LDLCALC", "TRIG", "CHOLHDL", "LDLDIRECT" in the last 72 hours. Anemia Panel: No results for input(s): "VITAMINB12", "FOLATE", "FERRITIN", "TIBC", "IRON", "RETICCTPCT" in the last 72 hours. Urine analysis:    Component Value Date/Time   COLORURINE YELLOW 10/29/2022 2155   APPEARANCEUR CLEAR 10/29/2022 2155   LABSPEC 1.010 10/29/2022 2155   PHURINE 6.0 10/29/2022 2155   GLUCOSEU NEGATIVE 10/29/2022 2155   HGBUR TRACE (A) 10/29/2022 2155   BILIRUBINUR MODERATE (A) 10/29/2022 2155   KETONESUR NEGATIVE 10/29/2022 2155   PROTEINUR NEGATIVE 10/29/2022 2155   NITRITE NEGATIVE 10/29/2022 2155   LEUKOCYTESUR NEGATIVE 10/29/2022 2155   Sepsis Labs: Invalid input(s): "PROCALCITONIN", "LACTICIDVEN"   SIGNED:  Almon Hercules, MD  [facility] 12/18/2022, 2:54 PM

## 2022-12-26 ENCOUNTER — Other Ambulatory Visit: Payer: Self-pay | Admitting: Nurse Practitioner

## 2023-01-03 ENCOUNTER — Ambulatory Visit: Payer: Self-pay | Admitting: Nurse Practitioner

## 2023-01-12 ENCOUNTER — Encounter (HOSPITAL_COMMUNITY): Payer: Self-pay

## 2023-01-12 ENCOUNTER — Emergency Department (HOSPITAL_COMMUNITY): Payer: Self-pay

## 2023-01-12 ENCOUNTER — Emergency Department (HOSPITAL_COMMUNITY)
Admission: EM | Admit: 2023-01-12 | Discharge: 2023-01-13 | Disposition: A | Discharge status: Home / Self Care | Payer: Self-pay

## 2023-01-12 ENCOUNTER — Other Ambulatory Visit: Payer: Self-pay

## 2023-01-12 DIAGNOSIS — R000 Tachycardia, unspecified: Secondary | ICD-10-CM | POA: Insufficient documentation

## 2023-01-12 DIAGNOSIS — R002 Palpitations: Secondary | ICD-10-CM | POA: Insufficient documentation

## 2023-01-12 LAB — COMPREHENSIVE METABOLIC PANEL
ALT: 29 U/L (ref 0–44)
AST: 38 U/L (ref 15–41)
Albumin: 3.8 g/dL (ref 3.5–5.0)
Alkaline Phosphatase: 54 U/L (ref 38–126)
Anion gap: 10 (ref 5–15)
BUN: 11 mg/dL (ref 6–20)
CO2: 22 mmol/L (ref 22–32)
Calcium: 9.1 mg/dL (ref 8.9–10.3)
Chloride: 107 mmol/L (ref 98–111)
Creatinine, Ser: 0.92 mg/dL (ref 0.61–1.24)
GFR, Estimated: >60 mL/min (ref >60)
Glucose, Bld: 110 mg/dL — ABNORMAL HIGH (ref 70–99)
Potassium: 3.7 mmol/L (ref 3.5–5.1)
Sodium: 139 mmol/L (ref 135–145)
Total Bilirubin: 0.8 mg/dL (ref <1.2)
Total Protein: 7.6 g/dL (ref 6.5–8.1)

## 2023-01-12 LAB — CBC WITH DIFFERENTIAL/PLATELET
Abs Immature Granulocytes: 0.01 10*3/uL (ref 0.00–0.07)
Basophils Absolute: 0.1 10*3/uL (ref 0.0–0.1)
Basophils Relative: 1 %
Eosinophils Absolute: 0 10*3/uL (ref 0.0–0.5)
Eosinophils Relative: 1 %
HCT: 42.3 % (ref 39.0–52.0)
Hemoglobin: 15 g/dL (ref 13.0–17.0)
Immature Granulocytes: 0 %
Lymphocytes Relative: 44 %
Lymphs Abs: 1.8 10*3/uL (ref 0.7–4.0)
MCH: 33 pg (ref 26.0–34.0)
MCHC: 35.5 g/dL (ref 30.0–36.0)
MCV: 93.2 fL (ref 80.0–100.0)
Monocytes Absolute: 0.4 10*3/uL (ref 0.1–1.0)
Monocytes Relative: 10 %
Neutro Abs: 1.8 10*3/uL (ref 1.7–7.7)
Neutrophils Relative %: 44 %
Platelets: 311 10*3/uL (ref 150–400)
RBC: 4.54 MIL/uL (ref 4.22–5.81)
RDW: 11.4 % — ABNORMAL LOW (ref 11.5–15.5)
WBC: 4.2 10*3/uL (ref 4.0–10.5)
nRBC: 0 % (ref 0.0–0.2)

## 2023-01-12 LAB — LIPASE, BLOOD: Lipase: 32 U/L (ref 11–51)

## 2023-01-12 LAB — I-STAT CG4 LACTIC ACID, ED
Lactic Acid, Venous: 2.7 mmol/L (ref 0.5–1.9)
Lactic Acid, Venous: 2.7 mmol/L (ref 0.5–1.9)
Lactic Acid, Venous: 2.7 mmol/L (ref 0.5–1.9)

## 2023-01-12 LAB — D-DIMER, QUANTITATIVE: D-Dimer, Quant: <0.27 ug{FEU}/mL (ref 0.00–0.50)

## 2023-01-12 LAB — TROPONIN I (HIGH SENSITIVITY)
Troponin I (High Sensitivity): 10 ng/L (ref <18)
Troponin I (High Sensitivity): 12 ng/L (ref <18)

## 2023-01-12 LAB — TSH: TSH: 3.941 u[IU]/mL (ref 0.350–4.500)

## 2023-01-12 LAB — ETHANOL: Alcohol, Ethyl (B): 265 mg/dL — ABNORMAL HIGH (ref <10)

## 2023-01-12 MED ORDER — LACTATED RINGERS IV BOLUS
1000.0000 mL | Freq: Once | INTRAVENOUS | Status: AC
  Administered 2023-01-12: 1000 mL via INTRAVENOUS

## 2023-01-12 MED ORDER — SODIUM CHLORIDE 0.9 % IV BOLUS
1000.0000 mL | Freq: Once | INTRAVENOUS | Status: AC
  Administered 2023-01-12: 1000 mL via INTRAVENOUS

## 2023-01-12 MED ORDER — LACTATED RINGERS IV BOLUS
500.0000 mL | Freq: Once | INTRAVENOUS | Status: AC
  Administered 2023-01-12: 500 mL via INTRAVENOUS

## 2023-01-12 MED ORDER — LACTATED RINGERS IV BOLUS
500.0000 mL | Freq: Once | INTRAVENOUS | Status: AC
Start: 2023-01-12 — End: 2023-01-13
  Administered 2023-01-12: 500 mL via INTRAVENOUS

## 2023-01-12 NOTE — ED Provider Notes (Signed)
[name] EMERGENCY DEPARTMENT AT [facility] Provider Note   CSN: [id number] Arrival date & time: 01/12/23  1947     History  Chief Complaint  Patient presents with   Palpitations    Devon Hanson is a 42 y.o. male history of A-fib not on anticoagulation, seizures due to alcohol withdrawal, GAD, alcoholic hepatitis presented for palpitations over the past few weeks.  Patient states that they came out of nowhere and is unsure what is causing these will make him any better.  Patient states he does drink quite often and that his last alcoholic drink was tequila last night with her friend.  Patient denies any tremors, vision changes, weakness, paresthesias, chest pain, shortness of breath, abdominal pain, nausea/vomiting.  Patient states he does not feel anxious and denies any medication changes.  Home Medications Prior to Admission medications   Medication Sig Start Date End Date Taking? Authorizing Provider  amLODipine (NORVASC) 10 MG tablet TAKE 1 TABLET BY MOUTH EVERY DAY 09/17/22   Sharon Seller, NP  escitalopram (LEXAPRO) 5 MG tablet Take 1 tablet (5 mg total) by mouth daily. 11/03/22   Rhetta Mura, MD  folic acid (FOLVITE) 1 MG tablet Take 1 tablet (1 mg total) by mouth daily. 12/19/22   Almon Hercules, MD  levETIRAcetam (KEPPRA) 750 MG tablet Take 1 tablet (750 mg total) by mouth 2 (two) times daily. 05/12/22   Sater, Pearletha Furl, MD  metoprolol succinate (TOPROL-XL) 50 MG 24 hr tablet TAKE 1 TABLET BY MOUTH EVERY DAY 12/27/22   Sharon Seller, NP  Multiple Vitamin (MULTIVITAMIN WITH MINERALS) TABS tablet Take 1 tablet by mouth daily. 12/19/22   Almon Hercules, MD  pantoprazole (PROTONIX) 40 MG tablet TAKE 1 TABLET BY MOUTH EVERY DAY 09/02/22   Sharon Seller, NP  thiamine (VITAMIN B-1) 100 MG tablet Take 1 tablet (100 mg total) by mouth daily. 12/19/22   Almon Hercules, MD      Allergies    Patient has no known allergies.    Review of Systems   Review  of Systems  Cardiovascular:  Positive for palpitations.    Physical Exam Updated Vital Signs BP (!) 146/115   Pulse (!) 117   Temp 98.2 F (36.8 C) (Oral)   Resp 18   Ht 5\' 8"  (1.727 m)   Wt 88.5 kg   SpO2 97%   BMI 29.65 kg/m  Physical Exam Vitals reviewed.  Constitutional:      General: He is not in acute distress. HENT:     Head: Normocephalic and atraumatic.  Eyes:     Extraocular Movements: Extraocular movements intact.     Conjunctiva/sclera: Conjunctivae normal.     Pupils: Pupils are equal, round, and reactive to light.  Cardiovascular:     Rate and Rhythm: Regular rhythm. Tachycardia present.     Pulses: Normal pulses.     Heart sounds: Normal heart sounds.     Comments: 2+ bilateral radial/dorsalis pedis pulses with increased rate Pulmonary:     Effort: Pulmonary effort is normal. No respiratory distress.     Breath sounds: Normal breath sounds.  Abdominal:     Palpations: Abdomen is soft.     Tenderness: There is no abdominal tenderness. There is no guarding or rebound.  Musculoskeletal:        General: Normal range of motion.     Cervical back: Normal range of motion and neck supple.     Comments: 5 out of 5  bilateral grip/leg extension strength No calf tenderness or leg swelling noted  Skin:    General: Skin is warm and dry.     Capillary Refill: Capillary refill takes less than 2 seconds.  Neurological:     General: No focal deficit present.     Mental Status: He is alert and oriented to person, place, and time.     Comments: Sensation intact in all 4 limbs  Psychiatric:        Mood and Affect: Mood normal.     ED Results / Procedures / Treatments   Labs (all labs ordered are listed, but only abnormal results are displayed) Labs Reviewed - No data to display  EKG None  Radiology No results found.  Procedures Procedures    Medications Ordered in ED Medications  sodium chloride 0.9 % bolus 1,000 mL (has no administration in time  range)    ED Course/ Medical Decision Making/ A&P                                 Medical Decision Making Amount and/or Complexity of Data Reviewed Labs: ordered. Radiology: ordered.   Isa Chonko 42 y.o. presented today for palpitations. Working DDx that I considered at this time includes, but not limited to, dehydration, electrolyte abnormalities, anemia, PE, hyperthyroid, lactic acidosis, alcohol intoxication/substance intoxication, alcohol withdrawal or drug withdrawal.  R/o DDx: pending  Review of prior external notes: 12/16/2022 discharge summary  Unique Tests and My Interpretation:  CBC: Unremarkable I-STAT lactic acid: 2.7, 2.7 Ethanol: 265 TSH: Unremarkable Lipase: Unremarkable CMP: Unremarkable Troponin: 10 UA: pending Chest x-ray: No acute cardiopulmonary changes EKG: Sinus tachycardia 120 bpm, no signs of ST elevation or depression noted indicate ischemia, no blocks noted, no signs of right heart strain  Social Determinants of Health: EtOH/Substance Abuse  Discussion with Independent Historian: None  Discussion of Management of Tests: None  Risk: Low: based on diagnostic testing/clinical impression and treatment plan  Risk Stratification Score: None  Plan: On exam patient was in no acute distress but was tachycardic at 118 in the room.  Patient feels and besides a tachycardia was unremarkable.  Patient does have history of daily alcohol use along with withdrawals however patient does not have any tremors on exam that would indicate withdrawals at this time.  Patient states this been happening for a few weeks and so doubt alcohol withdrawals at this point.  Triage note states that patient has been anxious however patient denied that with me.  Will obtain broad workup and start patient on fluids to see if this is due to dehydration.  Patient's lactic acid was elevated 2.7 and so this may be related to patient's tachycardia.  Patient's ethanol is also 265  and so with this being elevated highly doubt alcohol withdrawal.  Still waiting for the rest patient's labs however do feel this is related to dehydration and will give fluids.  Patient's repeat lactic acid was still 2.7 so we will recheck after patient receives a bolus of LR.  Patient's vitals did improve after receiving 2 L of fluids to her patient is no longer tachycardic.  Will obtain another lactic acid to ensure this is going down.  D-dimer ordered to rule out PE causing patient's tachycardia however I do suspect this is related to his alcohol use as he is dehydrated.  D-dimer was negative and so will not scan for PE.  On recheck patient was resting  comfortably and his heart rate was still in the 90s and so we will repeat the lactic and if negative will discharge as I do suspect this is all from his alcohol use.  Patient signed out to Mazie, PA-C.  Please review their note for the continuation of patient's care.  The plan at this point is discharge once the lactic acid is back if it is trending down.  Patient stable at this time.  This chart was dictated using voice recognition software.  Despite best efforts to proofread,  errors can occur which can change the documentation meaning.         Final Clinical Impression(s) / ED Diagnoses Final diagnoses:  None    Rx / DC Orders ED Discharge Orders     None         Netta Corrigan, PA-C 01/12/23 2348    Coral Spikes, DO 01/13/23 0002

## 2023-01-12 NOTE — ED Triage Notes (Signed)
Pt has had intermittent feelings of his heart racing and increased anxiety x " couple of weeks", but today he had a panic attack and felt it was time to get checked out. Pt has history of a-fib with cardioversion and takes metoprolol

## 2023-01-12 NOTE — Discharge Instructions (Signed)
Please follow-up with your primary care provider regards recent ER visit.  Today your labs show that the ER dehydrated which could be from the alcohol use.  Please decrease your alcohol use and drink plenty of fluids.  Symptoms change or worsen please return to ER.

## 2023-01-13 LAB — URINALYSIS, ROUTINE W REFLEX MICROSCOPIC
Bilirubin Urine: NEGATIVE
Glucose, UA: NEGATIVE mg/dL
Hgb urine dipstick: NEGATIVE
Ketones, ur: NEGATIVE mg/dL
Leukocytes,Ua: NEGATIVE
Nitrite: NEGATIVE
Protein, ur: NEGATIVE mg/dL
Specific Gravity, Urine: 1.016 (ref 1.005–1.030)
pH: 5 (ref 5.0–8.0)

## 2023-01-13 LAB — I-STAT CG4 LACTIC ACID, ED: Lactic Acid, Venous: 2.2 mmol/L (ref 0.5–1.9)

## 2023-01-13 NOTE — ED Provider Notes (Signed)
  Physical Exam  BP (!) 132/105 (BP Location: Right Arm)   Pulse 91   Temp 98.3 F (36.8 C) (Oral)   Resp 18   Ht 5\' 8"  (1.727 m)   Wt 88.5 kg   SpO2 97%   BMI 29.65 kg/m   Physical Exam  Procedures  Procedures  ED Course / MDM    Medical Decision Making Amount and/or Complexity of Data Reviewed Labs: ordered. Radiology: ordered.    Care of this patient assumed from preceding ED provider Schuman at time of shift change. Please see his associated note for further insight into the patient's ED course. In brief, patient with palpitations in context of ETOH abuse. Tachycardia but no dysrhythmia in the ED. At time of shift change pending, pending completion of fluid bolus and trending of lactic acid. Will require outpatient cardiology follow up. Anticipate discharge. ETOH >200 in the ED, doubt withdrawal at this time.   NSR on the monitor, CP free, lactic down trending after fluids. Clinical concern for emergent underlying etiology that would warrant further ED workup or inpatient management is exceedingly low. Will refer to cardiology; suspect possible contribution to palpitations by ETOH.   Copelan voiced understanding of his medical evaluation and treatment plan. Each of their questions answered to their expressed satisfaction.  Return precautions were given.  Patient is well-appearing, stable, and was discharged in good condition.  This chart was dictated using voice recognition software, Dragon. Despite the best efforts of this provider to proofread and correct errors, errors may still occur which can change documentation meaning.         Sherrilee Gilles 01/13/23 0202    Glynn Octave, MD 01/13/23 [phone]

## 2023-01-18 ENCOUNTER — Telehealth: Payer: Self-pay

## 2023-01-18 NOTE — Transitions of Care (Post Inpatient/ED Visit) (Signed)
   01/18/2023  Name: Devon Hanson MRN: [id number] DOB: Mar 30, 1980  Today's TOC FU Call Status: Today's TOC FU Call Status:: Unsuccessful Call (1st Attempt) Unsuccessful Call (1st Attempt) Date: 01/18/23  Attempted to reach the patient regarding the most recent Inpatient/ED visit.  Follow Up Plan: Additional outreach attempts will be made to reach the patient to complete the Transitions of Care (Post Inpatient/ED visit) call.   Signature : Murl Zogg.D/RMA

## 2023-01-26 NOTE — Transitions of Care (Post Inpatient/ED Visit) (Signed)
   01/26/2023  Name: Devon Hanson MRN: [id number] DOB: 17-Jul-1980  Today's TOC FU Call Status: Today's TOC FU Call Status:: Unsuccessful Call (2nd Attempt) Unsuccessful Call (1st Attempt) Date: 01/18/23 Unsuccessful Call (2nd Attempt) Date: 01/26/23  Attempted to reach the patient regarding the most recent Inpatient/ED visit.  Follow Up Plan: Additional outreach attempts will be made to reach the patient to complete the Transitions of Care (Post Inpatient/ED visit) call.   Signature : Delmi Fulfer.D/RMA

## 2023-02-25 ENCOUNTER — Ambulatory Visit: Payer: Self-pay | Admitting: Nurse Practitioner

## 2023-03-13 ENCOUNTER — Other Ambulatory Visit: Payer: Self-pay | Admitting: Neurology

## 2023-03-18 ENCOUNTER — Other Ambulatory Visit: Payer: Self-pay | Admitting: Neurology

## 2023-04-04 ENCOUNTER — Other Ambulatory Visit: Payer: Self-pay

## 2023-04-04 ENCOUNTER — Inpatient Hospital Stay (HOSPITAL_COMMUNITY)
Admission: EM | Admit: 2023-04-04 | Discharge: 2023-04-06 | DRG: 641 | Disposition: A | Discharge status: Home / Self Care | Payer: MEDICAID | Attending: Student | Admitting: Student

## 2023-04-04 DIAGNOSIS — Z9111 Patient's noncompliance with dietary regimen due to financial hardship: Secondary | ICD-10-CM

## 2023-04-04 DIAGNOSIS — A084 Viral intestinal infection, unspecified: Secondary | ICD-10-CM | POA: Diagnosis present

## 2023-04-04 DIAGNOSIS — F32A Depression, unspecified: Secondary | ICD-10-CM | POA: Diagnosis present

## 2023-04-04 DIAGNOSIS — R7401 Elevation of levels of liver transaminase levels: Secondary | ICD-10-CM | POA: Diagnosis present

## 2023-04-04 DIAGNOSIS — D696 Thrombocytopenia, unspecified: Secondary | ICD-10-CM

## 2023-04-04 DIAGNOSIS — F1090 Alcohol use, unspecified, uncomplicated: Secondary | ICD-10-CM | POA: Diagnosis present

## 2023-04-04 DIAGNOSIS — D6959 Other secondary thrombocytopenia: Secondary | ICD-10-CM | POA: Diagnosis present

## 2023-04-04 DIAGNOSIS — R569 Unspecified convulsions: Secondary | ICD-10-CM

## 2023-04-04 DIAGNOSIS — E878 Other disorders of electrolyte and fluid balance, not elsewhere classified: Secondary | ICD-10-CM | POA: Diagnosis present

## 2023-04-04 DIAGNOSIS — E876 Hypokalemia: Secondary | ICD-10-CM | POA: Diagnosis not present

## 2023-04-04 DIAGNOSIS — F1010 Alcohol abuse, uncomplicated: Secondary | ICD-10-CM | POA: Diagnosis present

## 2023-04-04 DIAGNOSIS — Z79899 Other long term (current) drug therapy: Secondary | ICD-10-CM

## 2023-04-04 DIAGNOSIS — D638 Anemia in other chronic diseases classified elsewhere: Secondary | ICD-10-CM | POA: Diagnosis present

## 2023-04-04 DIAGNOSIS — F419 Anxiety disorder, unspecified: Secondary | ICD-10-CM | POA: Diagnosis present

## 2023-04-04 DIAGNOSIS — R112 Nausea with vomiting, unspecified: Secondary | ICD-10-CM | POA: Diagnosis present

## 2023-04-04 DIAGNOSIS — I480 Paroxysmal atrial fibrillation: Secondary | ICD-10-CM | POA: Diagnosis present

## 2023-04-04 DIAGNOSIS — R197 Diarrhea, unspecified: Secondary | ICD-10-CM | POA: Diagnosis present

## 2023-04-04 DIAGNOSIS — R000 Tachycardia, unspecified: Secondary | ICD-10-CM | POA: Diagnosis present

## 2023-04-04 DIAGNOSIS — R55 Syncope and collapse: Secondary | ICD-10-CM | POA: Diagnosis present

## 2023-04-04 DIAGNOSIS — G40909 Epilepsy, unspecified, not intractable, without status epilepticus: Secondary | ICD-10-CM | POA: Diagnosis present

## 2023-04-04 DIAGNOSIS — Z5986 Financial insecurity: Secondary | ICD-10-CM

## 2023-04-04 DIAGNOSIS — K219 Gastro-esophageal reflux disease without esophagitis: Secondary | ICD-10-CM | POA: Diagnosis present

## 2023-04-04 DIAGNOSIS — E860 Dehydration: Secondary | ICD-10-CM | POA: Diagnosis present

## 2023-04-04 DIAGNOSIS — I10 Essential (primary) hypertension: Secondary | ICD-10-CM | POA: Diagnosis present

## 2023-04-04 DIAGNOSIS — F10939 Unspecified convulsions: Secondary | ICD-10-CM

## 2023-04-04 LAB — CBC WITH DIFFERENTIAL/PLATELET
Abs Immature Granulocytes: 0.05 10*3/uL (ref 0.00–0.07)
Basophils Absolute: 0 10*3/uL (ref 0.0–0.1)
Basophils Relative: 0 %
Eosinophils Absolute: 0 10*3/uL (ref 0.0–0.5)
Eosinophils Relative: 0 %
Hemoglobin: 9.5 g/dL — ABNORMAL LOW (ref 13.0–17.0)
Immature Granulocytes: 1 %
Lymphocytes Relative: 19 %
Lymphs Abs: 0.9 10*3/uL (ref 0.7–4.0)
Monocytes Absolute: 0.4 10*3/uL (ref 0.1–1.0)
Monocytes Relative: 9 %
Neutro Abs: 3.2 10*3/uL (ref 1.7–7.7)
Neutrophils Relative %: 71 %
Platelets: 50 10*3/uL — ABNORMAL LOW (ref 150–400)
WBC: 4.6 10*3/uL (ref 4.0–10.5)
nRBC: 0.9 % — ABNORMAL HIGH (ref 0.0–0.2)

## 2023-04-04 LAB — CLOSTRIDIUM DIFFICILE BY PCR, REFLEXED: Toxigenic C. Difficile by PCR: NEGATIVE

## 2023-04-04 LAB — MAGNESIUM
Magnesium: 0.9 mg/dL — CL (ref 1.7–2.4)
Magnesium: 0.9 mg/dL — CL (ref 1.7–2.4)
Magnesium: 1.5 mg/dL — ABNORMAL LOW (ref 1.7–2.4)

## 2023-04-04 LAB — BASIC METABOLIC PANEL
Anion gap: 14 (ref 5–15)
Anion gap: 12 (ref 5–15)
BUN: 7 mg/dL (ref 6–20)
BUN: 8 mg/dL (ref 6–20)
CO2: 29 mmol/L (ref 22–32)
CO2: 28 mmol/L (ref 22–32)
Calcium: 6.8 mg/dL — ABNORMAL LOW (ref 8.9–10.3)
Calcium: 6.7 mg/dL — ABNORMAL LOW (ref 8.9–10.3)
Chloride: 90 mmol/L — ABNORMAL LOW (ref 98–111)
Chloride: 92 mmol/L — ABNORMAL LOW (ref 98–111)
Creatinine, Ser: 1.05 mg/dL (ref 0.61–1.24)
Creatinine, Ser: 1.2 mg/dL (ref 0.61–1.24)
GFR, Estimated: >60 mL/min (ref >60)
GFR, Estimated: >60 mL/min (ref >60)
Glucose, Bld: 101 mg/dL — ABNORMAL HIGH (ref 70–99)
Glucose, Bld: 123 mg/dL — ABNORMAL HIGH (ref 70–99)
Potassium: 2.2 mmol/L — CL (ref 3.5–5.1)
Potassium: 2.5 mmol/L — CL (ref 3.5–5.1)
Sodium: 133 mmol/L — ABNORMAL LOW (ref 135–145)
Sodium: 132 mmol/L — ABNORMAL LOW (ref 135–145)

## 2023-04-04 LAB — COMPREHENSIVE METABOLIC PANEL
ALT: 25 U/L (ref 0–44)
AST: 80 U/L — ABNORMAL HIGH (ref 15–41)
Albumin: 3.7 g/dL (ref 3.5–5.0)
Alkaline Phosphatase: 87 U/L (ref 38–126)
Anion gap: 13 (ref 5–15)
BUN: 7 mg/dL (ref 6–20)
CO2: 29 mmol/L (ref 22–32)
Calcium: 6.6 mg/dL — ABNORMAL LOW (ref 8.9–10.3)
Chloride: 90 mmol/L — ABNORMAL LOW (ref 98–111)
Creatinine, Ser: 1.03 mg/dL (ref 0.61–1.24)
GFR, Estimated: >60 mL/min (ref >60)
Glucose, Bld: 102 mg/dL — ABNORMAL HIGH (ref 70–99)
Potassium: 2.2 mmol/L — CL (ref 3.5–5.1)
Sodium: 132 mmol/L — ABNORMAL LOW (ref 135–145)
Total Bilirubin: 1.2 mg/dL (ref 0.0–1.2)
Total Protein: 6.8 g/dL (ref 6.5–8.1)

## 2023-04-04 LAB — C DIFFICILE QUICK SCREEN W PCR REFLEX
C Diff antigen: POSITIVE — AB
C Diff toxin: NEGATIVE

## 2023-04-04 LAB — HIV ANTIBODY (ROUTINE TESTING W REFLEX): HIV Screen 4th Generation wRfx: NONREACTIVE

## 2023-04-04 LAB — ETHANOL: Alcohol, Ethyl (B): <10 mg/dL (ref <10)

## 2023-04-04 LAB — LIPASE, BLOOD: Lipase: 53 U/L — ABNORMAL HIGH (ref 11–51)

## 2023-04-04 MED ORDER — KETOROLAC TROMETHAMINE 30 MG/ML IJ SOLN
30.0000 mg | Freq: Once | INTRAMUSCULAR | Status: AC
  Administered 2023-04-04: 30 mg via INTRAVENOUS
  Filled 2023-04-04: qty 1

## 2023-04-04 MED ORDER — ACETAMINOPHEN 650 MG RE SUPP: 650.0000 mg | Freq: Four times a day (QID) | RECTAL | Status: DC | PRN

## 2023-04-04 MED ORDER — PANTOPRAZOLE SODIUM 40 MG PO TBEC
40.0000 mg | DELAYED_RELEASE_TABLET | Freq: Every day | ORAL | Status: DC
  Administered 2023-04-05 – 2023-04-06 (×2): 40 mg via ORAL
  Filled 2023-04-04 (×2): qty 1

## 2023-04-04 MED ORDER — LOPERAMIDE HCL 2 MG PO CAPS
4.0000 mg | ORAL_CAPSULE | Freq: Once | ORAL | Status: AC
  Administered 2023-04-04: 4 mg via ORAL
  Filled 2023-04-04: qty 2

## 2023-04-04 MED ORDER — AMLODIPINE BESYLATE 5 MG PO TABS: 10.0000 mg | ORAL_TABLET | Freq: Every day | ORAL | Status: DC

## 2023-04-04 MED ORDER — FENTANYL CITRATE PF 50 MCG/ML IJ SOSY
50.0000 ug | PREFILLED_SYRINGE | Freq: Once | INTRAMUSCULAR | Status: AC
  Administered 2023-04-04: 50 ug via INTRAVENOUS
  Filled 2023-04-04: qty 1

## 2023-04-04 MED ORDER — FOLIC ACID 1 MG PO TABS
1.0000 mg | ORAL_TABLET | Freq: Every day | ORAL | Status: DC
Start: 2023-04-05 — End: 2023-04-04

## 2023-04-04 MED ORDER — FOLIC ACID 1 MG PO TABS
1.0000 mg | ORAL_TABLET | Freq: Every day | ORAL | Status: DC
Start: 2023-04-05 — End: 2023-04-06
  Administered 2023-04-05 – 2023-04-06 (×2): 1 mg via ORAL
  Filled 2023-04-04 (×2): qty 1

## 2023-04-04 MED ORDER — SODIUM CHLORIDE 0.9 % IV SOLN: INTRAVENOUS | Status: DC

## 2023-04-04 MED ORDER — ONDANSETRON HCL 4 MG/2ML IJ SOLN
4.0000 mg | Freq: Once | INTRAMUSCULAR | Status: DC
  Filled 2023-04-04: qty 2

## 2023-04-04 MED ORDER — LORAZEPAM 2 MG/ML IJ SOLN: 1.0000 mg | Freq: Once | INTRAMUSCULAR | Status: DC

## 2023-04-04 MED ORDER — METOPROLOL TARTRATE 25 MG PO TABS
50.0000 mg | ORAL_TABLET | ORAL | Status: AC
  Administered 2023-04-04: 50 mg via ORAL
  Filled 2023-04-04: qty 2

## 2023-04-04 MED ORDER — POTASSIUM CHLORIDE 10 MEQ/100ML IV SOLN
10.0000 meq | INTRAVENOUS | Status: AC
  Administered 2023-04-04 (×5): 10 meq via INTRAVENOUS
  Filled 2023-04-04 (×5): qty 100

## 2023-04-04 MED ORDER — ADULT MULTIVITAMIN W/MINERALS CH
1.0000 | ORAL_TABLET | Freq: Every day | ORAL | Status: DC
  Administered 2023-04-05 – 2023-04-06 (×2): 1 via ORAL
  Filled 2023-04-04 (×2): qty 1

## 2023-04-04 MED ORDER — METOPROLOL SUCCINATE ER 25 MG PO TB24
50.0000 mg | ORAL_TABLET | Freq: Every day | ORAL | Status: DC
  Administered 2023-04-05 – 2023-04-06 (×2): 50 mg via ORAL
  Filled 2023-04-04 (×2): qty 2

## 2023-04-04 MED ORDER — LEVETIRACETAM 500 MG PO TABS
750.0000 mg | ORAL_TABLET | Freq: Two times a day (BID) | ORAL | Status: DC
  Administered 2023-04-04 – 2023-04-06 (×4): 750 mg via ORAL
  Filled 2023-04-04 (×4): qty 1

## 2023-04-04 MED ORDER — POTASSIUM CHLORIDE CRYS ER 20 MEQ PO TBCR
40.0000 meq | EXTENDED_RELEASE_TABLET | Freq: Once | ORAL | Status: AC
  Administered 2023-04-04: 40 meq via ORAL
  Filled 2023-04-04: qty 2

## 2023-04-04 MED ORDER — MAGNESIUM SULFATE 2 GM/50ML IV SOLN
2.0000 g | INTRAVENOUS | Status: AC
  Administered 2023-04-04: 2 g via INTRAVENOUS
  Filled 2023-04-04: qty 50

## 2023-04-04 MED ORDER — POTASSIUM CHLORIDE 10 MEQ/100ML IV SOLN
10.0000 meq | Freq: Once | INTRAVENOUS | Status: AC
  Administered 2023-04-04: 10 meq via INTRAVENOUS
  Filled 2023-04-04: qty 100

## 2023-04-04 MED ORDER — THIAMINE MONONITRATE 100 MG PO TABS
100.0000 mg | ORAL_TABLET | Freq: Every day | ORAL | Status: DC
  Administered 2023-04-05 – 2023-04-06 (×2): 100 mg via ORAL
  Filled 2023-04-04 (×2): qty 1

## 2023-04-04 MED ORDER — ACETAMINOPHEN 325 MG PO TABS: 650.0000 mg | ORAL_TABLET | Freq: Four times a day (QID) | ORAL | Status: DC | PRN

## 2023-04-04 MED ORDER — LOPERAMIDE HCL 2 MG PO CAPS: 4.0000 mg | ORAL_CAPSULE | ORAL | Status: DC | PRN

## 2023-04-04 NOTE — ED Provider Notes (Addendum)
  EMERGENCY DEPARTMENT AT [facility] Provider Note   CSN: [id number] Arrival date & time: 04/04/23  1126     History  Chief Complaint  Patient presents with   Nausea   Emesis   Diarrhea    Devon Hanson is a 43 y.o. male.  HPI   This patient is a 43 year old male, history of seizure disorder on Keppra, history of hypertension on metoprolol and amlodipine though he states he has not had these medications in over a month because of insurance problems.  He is still taking the Keppra.  Over the course of the last week the patient reports that he has had some difficulty holding down food and fluids and medications, has had multiple episodes of vomiting and diarrhea per day going on for more than 7 days.  It is mostly stomach acid and his diarrhea is nonbloody and watery.  He has no abdominal pain or chest pain.  Today he did have a syncopal event which is why the paramedics were called.  There was no seizure activity, he was noted to have a heart rate of 140 for which she was given a 500 cc bolus of lactated Ringer's with improvement of his heart rate to 120.  His blood pressure was about 180/120 on EMS.  Again the patient denies having medications for blood pressure in over a month.  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 Authorizing Provider  amLODipine (NORVASC) 10 MG tablet TAKE 1 TABLET BY MOUTH EVERY DAY 09/17/22   Sharon Seller, NP  escitalopram (LEXAPRO) 5 MG tablet Take 1 tablet (5 mg total) by mouth daily. 11/03/22   Rhetta Mura, MD  folic acid (FOLVITE) 1 MG tablet Take 1 tablet (1 mg total) by mouth daily. 12/19/22   Almon Hercules, MD  levETIRAcetam (KEPPRA) 750 MG tablet Take 1 tablet (750 mg total) by mouth 2 (two) times daily. 05/12/22   Sater, Pearletha Furl, MD   metoprolol succinate (TOPROL-XL) 50 MG 24 hr tablet TAKE 1 TABLET BY MOUTH EVERY DAY 12/27/22   Sharon Seller, NP  Multiple Vitamin (MULTIVITAMIN WITH MINERALS) TABS tablet Take 1 tablet by mouth daily. 12/19/22   Almon Hercules, MD  pantoprazole (PROTONIX) 40 MG tablet TAKE 1 TABLET BY MOUTH EVERY DAY 09/02/22   Sharon Seller, NP  thiamine (VITAMIN B-1) 100 MG tablet Take 1 tablet (100 mg total) by mouth daily. 12/19/22   Almon Hercules, MD      Allergies    Patient has no known allergies.    Review of Systems   Review of Systems  All other systems reviewed and are negative.   Physical Exam Updated Vital Signs BP (!) 146/98   Pulse (!) 106   Temp 99.4 F (37.4 C) (Oral)   Resp 18   SpO2 98%  Physical Exam Vitals and nursing note reviewed.  Constitutional:      General: He is not in acute distress.    Appearance: He is well-developed.  HENT:     Head: Normocephalic and atraumatic.     Mouth/Throat:     Pharynx: No oropharyngeal exudate.  Eyes:     General: No scleral icterus.       Right eye: No discharge.  Left eye: No discharge.     Conjunctiva/sclera: Conjunctivae normal.     Pupils: Pupils are equal, round, and reactive to light.  Neck:     Thyroid: No thyromegaly.     Vascular: No JVD.  Cardiovascular:     Rate and Rhythm: Regular rhythm. Tachycardia present.     Heart sounds: Normal heart sounds. No murmur heard.    No friction rub. No gallop.  Pulmonary:     Effort: Pulmonary effort is normal. No respiratory distress.     Breath sounds: Normal breath sounds. No wheezing or rales.  Abdominal:     General: Bowel sounds are normal. There is no distension.     Palpations: Abdomen is soft. There is no mass.     Tenderness: There is no abdominal tenderness.  Musculoskeletal:        General: No tenderness. Normal range of motion.     Cervical back: Normal range of motion and neck supple.     Right lower leg: No edema.     Left lower leg: No edema.   Lymphadenopathy:     Cervical: No cervical adenopathy.  Skin:    General: Skin is warm and dry.     Findings: No erythema or rash.  Neurological:     General: No focal deficit present.     Mental Status: He is alert.     Coordination: Coordination normal.  Psychiatric:        Behavior: Behavior normal.     ED Results / Procedures / Treatments   Labs (all labs ordered are listed, but only abnormal results are displayed) Labs Reviewed  COMPREHENSIVE METABOLIC PANEL - Abnormal; Notable for the following components:      Result Value   Sodium 132 (*)    Potassium 2.2 (*)    Chloride 90 (*)    Glucose, Bld 102 (*)    Calcium 6.6 (*)    AST 80 (*)    All other components within normal limits  LIPASE, BLOOD - Abnormal; Notable for the following components:   Lipase 53 (*)    All other components within normal limits  MAGNESIUM - Abnormal; Notable for the following components:   Magnesium 0.9 (*)    All other components within normal limits  C DIFFICILE QUICK SCREEN W PCR REFLEX    ETHANOL  CBC WITH DIFFERENTIAL/PLATELET    EKG EKG Interpretation Date/Time:  Monday April 04 2023 12:30:56 EST Ventricular Rate:  103 PR Interval:  156 QRS Duration:  76 QT Interval:  378 QTC Calculation: 495 R Axis:   0  Text Interpretation: Sinus tachycardia Cannot rule out Anteroseptal infarct , age undetermined Abnormal ECG When compared with ECG of 12-Jan-2023 23:56, PREVIOUS ECG IS PRESENT Confirmed by Eber Hong ([id number]) on 04/04/2023 12:34:59 PM  Radiology No results found.  Procedures .Critical Care  Performed by: Eber Hong, MD Authorized by: Eber Hong, MD   Critical care provider statement:    Critical care time (minutes):  45   Critical care time was exclusive of:  Separately billable procedures and treating other patients and teaching time   Critical care was necessary to treat or prevent imminent or life-threatening deterioration of the following conditions:   Metabolic crisis and dehydration   Critical care was time spent personally by me on the following activities:  Development of treatment plan with patient or surrogate, discussions with consultants, evaluation of patient's response to treatment, examination of patient, obtaining history from patient or surrogate, review of  old charts, re-evaluation of patient's condition, pulse oximetry, ordering and review of radiographic studies, ordering and review of laboratory studies and ordering and performing treatments and interventions   I assumed direction of critical care for this patient from another provider in my specialty: no     Care discussed with: admitting provider       Medications Ordered in ED Medications  0.9 %  sodium chloride infusion ( Intravenous New Bag/Given 04/04/23 1442)  ondansetron (ZOFRAN) injection 4 mg (has no administration in time range)  magnesium sulfate IVPB 2 g 50 mL (2 g Intravenous New Bag/Given 04/04/23 1513)  potassium chloride 10 mEq in 100 mL IVPB (10 mEq Intravenous New Bag/Given 04/04/23 1511)  LORazepam (ATIVAN) injection 1 mg (has no administration in time range)  loperamide (IMODIUM) capsule 4 mg (4 mg Oral Given 04/04/23 1504)  metoprolol tartrate (LOPRESSOR) tablet 50 mg (50 mg Oral Given 04/04/23 1504)  potassium chloride SA (KLOR-CON M) CR tablet 40 mEq (40 mEq Oral Given 04/04/23 1504)  fentaNYL (SUBLIMAZE) injection 50 mcg (50 mcg Intravenous Given 04/04/23 1503)  ketorolac (TORADOL) 30 MG/ML injection 30 mg (30 mg Intravenous Given 04/04/23 1508)    ED Course/ Medical Decision Making/ A&P                                 Medical Decision Making Amount and/or Complexity of Data Reviewed Labs: ordered. ECG/medicine tests: ordered.  Risk Prescription drug management. Decision regarding hospitalization.    This patient presents to the ED for concern of dehydration, weakness, nausea vomiting diarrhea, this involves an extensive number of treatment  options, and is a complaint that carries with it a high risk of complications and morbidity.  The differential diagnosis includes duration, renal insufficiency, viral infection, gastroenteritis, food poisoning, colitis   Co morbidities that complicate the patient evaluation  History of hypertension unmedicated, history of seizures   Additional history obtained:  Additional history obtained from the record and the EMS personnel External records from outside source obtained and reviewed including was admitted to the hospital in October for transaminitis and alcohol abuse, reports that he has reduced the amount of alcohol that he drinks several months ago and now "I do not drink that much".  He was admitted in February of last year for seizures   Lab Tests:  I Ordered, and personally interpreted labs.  The pertinent results include: Severe at 2.2, severe hypomagnesemia at 0.9, mild transaminitis, alcohol undetectable Platelets - 50,000, (311,000)  WBC 4.6 and Hgb around 9.  Cardiac Monitoring: / EKG:  The patient was maintained on a cardiac monitor.  I personally viewed and interpreted the cardiac monitored which showed an underlying rhythm of: The patient is tachycardic with a heart rate of about 110   Consultations Obtained:  I requested consultation with the hospitalist,  and discussed lab and imaging findings as well as pertinent plan - they recommend: Admission to the hospital   Problem List / ED Course / Critical interventions / Medication management  The patient has severe electrolyte disturbances and with his persistent nausea vomiting and dehydration with diarrhea throughout the week as well as more logical and safe to admit this patient overnight for ongoing IV hydration as well as IV electrolyte replacement.  Will discuss with hospitalist for admission I ordered medication including magnesia MIV, potassium IV, potassium p.o., Zofran, IV fluid resuscitation for severe  dehydration and electrolyte disturbance Reevaluation of the  patient after these medicines showed that the patient improved I have reviewed the patients home medicines and have made adjustments as needed   Social Determinants of Health:  History of heavy alcohol use   Test / Admission - Considered:  Will admit, the patient may very well have some component of alcohol withdrawal, will give some Ativan, I do not see any signs of QT prolongation on EKG at this time         Final Clinical Impression(s) / ED Diagnoses Final diagnoses:  Hypokalemia  Hypomagnesemia  Dehydration     Eber Hong, MD 04/04/23 1451    Eber Hong, MD 04/04/23 [phone]

## 2023-04-04 NOTE — ED Triage Notes (Signed)
 Pt bib from home by ems for N/V/D x 1 week. Pt presents with HTN  No other complaints PMH: HTN - non compliant  Seizures - pt is compliant with keppra and does not have history of break through seizures
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
## 2023-04-04 NOTE — ED Notes (Signed)
Pt placed on cardiac monitor in hallway

## 2023-04-04 NOTE — Plan of Care (Signed)

## 2023-04-04 NOTE — Progress Notes (Addendum)
 Date and time results received: 04/04/23 1904 (use smartphrase ".now" to insert current time)  Test: Potassium & Magnesium Critical Value:  Potassium: 2.2; Magnesium: 0.9  Name of Provider Notified: Beola Cord, MD  Orders Received? Or Actions Taken?:  Orders received

## 2023-04-04 NOTE — ED Notes (Signed)
 Hyacinth Meeker MD advised of critical lab values by this RN

## 2023-04-04 NOTE — Plan of Care (Signed)
  Problem: Education: Goal: Knowledge of General Education information will improve Description: Including pain rating scale, medication(s)/side effects and non-pharmacologic comfort measures Outcome: Progressing   Problem: Activity: Goal: Risk for activity intolerance will decrease Outcome: Progressing   Problem: Nutrition: Goal: Adequate nutrition will be maintained Outcome: Progressing   Problem: Elimination: Goal: Will not experience complications related to bowel motility Outcome: Not Progressing

## 2023-04-05 ENCOUNTER — Encounter (HOSPITAL_COMMUNITY): Payer: Self-pay | Admitting: Student

## 2023-04-05 DIAGNOSIS — I480 Paroxysmal atrial fibrillation: Secondary | ICD-10-CM | POA: Diagnosis present

## 2023-04-05 DIAGNOSIS — F10930 Alcohol use, unspecified with withdrawal, uncomplicated: Secondary | ICD-10-CM | POA: Diagnosis not present

## 2023-04-05 DIAGNOSIS — R197 Diarrhea, unspecified: Secondary | ICD-10-CM | POA: Diagnosis present

## 2023-04-05 DIAGNOSIS — E860 Dehydration: Secondary | ICD-10-CM | POA: Diagnosis present

## 2023-04-05 DIAGNOSIS — K219 Gastro-esophageal reflux disease without esophagitis: Secondary | ICD-10-CM | POA: Diagnosis present

## 2023-04-05 DIAGNOSIS — Z9111 Patient's noncompliance with dietary regimen due to financial hardship: Secondary | ICD-10-CM | POA: Diagnosis not present

## 2023-04-05 DIAGNOSIS — G40909 Epilepsy, unspecified, not intractable, without status epilepticus: Secondary | ICD-10-CM | POA: Diagnosis present

## 2023-04-05 DIAGNOSIS — F1010 Alcohol abuse, uncomplicated: Secondary | ICD-10-CM | POA: Diagnosis present

## 2023-04-05 DIAGNOSIS — E876 Hypokalemia: Secondary | ICD-10-CM | POA: Diagnosis present

## 2023-04-05 DIAGNOSIS — Z79899 Other long term (current) drug therapy: Secondary | ICD-10-CM | POA: Diagnosis not present

## 2023-04-05 DIAGNOSIS — Z5986 Financial insecurity: Secondary | ICD-10-CM | POA: Diagnosis not present

## 2023-04-05 DIAGNOSIS — I10 Essential (primary) hypertension: Secondary | ICD-10-CM

## 2023-04-05 DIAGNOSIS — R7401 Elevation of levels of liver transaminase levels: Secondary | ICD-10-CM | POA: Diagnosis present

## 2023-04-05 DIAGNOSIS — F419 Anxiety disorder, unspecified: Secondary | ICD-10-CM | POA: Diagnosis present

## 2023-04-05 DIAGNOSIS — F32A Depression, unspecified: Secondary | ICD-10-CM | POA: Diagnosis present

## 2023-04-05 DIAGNOSIS — A084 Viral intestinal infection, unspecified: Secondary | ICD-10-CM | POA: Diagnosis present

## 2023-04-05 DIAGNOSIS — D638 Anemia in other chronic diseases classified elsewhere: Secondary | ICD-10-CM | POA: Diagnosis present

## 2023-04-05 DIAGNOSIS — R112 Nausea with vomiting, unspecified: Secondary | ICD-10-CM | POA: Diagnosis not present

## 2023-04-05 DIAGNOSIS — D6959 Other secondary thrombocytopenia: Secondary | ICD-10-CM | POA: Diagnosis present

## 2023-04-05 DIAGNOSIS — R55 Syncope and collapse: Secondary | ICD-10-CM | POA: Diagnosis present

## 2023-04-05 DIAGNOSIS — R000 Tachycardia, unspecified: Secondary | ICD-10-CM | POA: Diagnosis present

## 2023-04-05 DIAGNOSIS — E878 Other disorders of electrolyte and fluid balance, not elsewhere classified: Secondary | ICD-10-CM | POA: Diagnosis present

## 2023-04-05 LAB — IRON AND TIBC
Iron: 38 ug/dL — ABNORMAL LOW (ref 45–182)
Saturation Ratios: 14 % — ABNORMAL LOW (ref 17.9–39.5)
TIBC: 267 ug/dL (ref 250–450)
UIBC: 229 ug/dL

## 2023-04-05 LAB — CBC
HCT: 22.4 % — ABNORMAL LOW (ref 39.0–52.0)
Hemoglobin: 8.4 g/dL — ABNORMAL LOW (ref 13.0–17.0)
MCH: 37 pg — ABNORMAL HIGH (ref 26.0–34.0)
MCHC: 37.5 g/dL — ABNORMAL HIGH (ref 30.0–36.0)
MCV: 98.7 fL (ref 80.0–100.0)
Platelets: 57 10*3/uL — ABNORMAL LOW (ref 150–400)
RBC: 2.27 MIL/uL — ABNORMAL LOW (ref 4.22–5.81)
RDW: 17.9 % — ABNORMAL HIGH (ref 11.5–15.5)
WBC: 4.6 10*3/uL (ref 4.0–10.5)
nRBC: 1.3 % — ABNORMAL HIGH (ref 0.0–0.2)

## 2023-04-05 LAB — RESPIRATORY PANEL BY PCR

## 2023-04-05 LAB — COMPREHENSIVE METABOLIC PANEL
ALT: 22 U/L (ref 0–44)
ALT: 23 U/L (ref 0–44)
AST: 53 U/L — ABNORMAL HIGH (ref 15–41)
AST: 57 U/L — ABNORMAL HIGH (ref 15–41)
Albumin: 3.2 g/dL — ABNORMAL LOW (ref 3.5–5.0)
Albumin: 3.2 g/dL — ABNORMAL LOW (ref 3.5–5.0)
Alkaline Phosphatase: 73 U/L (ref 38–126)
Alkaline Phosphatase: 73 U/L (ref 38–126)
Anion gap: 12 (ref 5–15)
Anion gap: 9 (ref 5–15)
BUN: 6 mg/dL (ref 6–20)
BUN: <5 mg/dL — ABNORMAL LOW (ref 6–20)
CO2: 27 mmol/L (ref 22–32)
CO2: 26 mmol/L (ref 22–32)
Calcium: 6.6 mg/dL — ABNORMAL LOW (ref 8.9–10.3)
Calcium: 7.3 mg/dL — ABNORMAL LOW (ref 8.9–10.3)
Chloride: 99 mmol/L (ref 98–111)
Chloride: 104 mmol/L (ref 98–111)
Creatinine, Ser: 0.89 mg/dL (ref 0.61–1.24)
Creatinine, Ser: 0.8 mg/dL (ref 0.61–1.24)
GFR, Estimated: >60 mL/min (ref >60)
GFR, Estimated: >60 mL/min (ref >60)
Glucose, Bld: 102 mg/dL — ABNORMAL HIGH (ref 70–99)
Glucose, Bld: 107 mg/dL — ABNORMAL HIGH (ref 70–99)
Potassium: 2.9 mmol/L — ABNORMAL LOW (ref 3.5–5.1)
Potassium: 3.3 mmol/L — ABNORMAL LOW (ref 3.5–5.1)
Sodium: 138 mmol/L (ref 135–145)
Sodium: 139 mmol/L (ref 135–145)
Total Bilirubin: 0.8 mg/dL (ref 0.0–1.2)
Total Bilirubin: 0.5 mg/dL (ref 0.0–1.2)
Total Protein: 6.2 g/dL — ABNORMAL LOW (ref 6.5–8.1)
Total Protein: 6.3 g/dL — ABNORMAL LOW (ref 6.5–8.1)

## 2023-04-05 LAB — GASTROINTESTINAL PANEL BY PCR, STOOL (REPLACES STOOL CULTURE)

## 2023-04-05 LAB — FERRITIN: Ferritin: 1395 ng/mL — ABNORMAL HIGH (ref 24–336)

## 2023-04-05 LAB — MAGNESIUM
Magnesium: 1.5 mg/dL — ABNORMAL LOW (ref 1.7–2.4)
Magnesium: 2.4 mg/dL (ref 1.7–2.4)

## 2023-04-05 LAB — SARS CORONAVIRUS 2 BY RT PCR: SARS Coronavirus 2 by RT PCR: NEGATIVE

## 2023-04-05 LAB — RETICULOCYTES
Immature Retic Fract: 11.4 % (ref 2.3–15.9)
RBC.: 2.32 MIL/uL — ABNORMAL LOW (ref 4.22–5.81)
Retic Count, Absolute: 48.3 10*3/uL (ref 19.0–186.0)
Retic Ct Pct: 2.1 % (ref 0.4–3.1)

## 2023-04-05 LAB — FOLATE: Folate: 17.7 ng/mL (ref >5.9)

## 2023-04-05 LAB — VITAMIN B12: Vitamin B-12: 562 pg/mL (ref 180–914)

## 2023-04-05 LAB — CK: Total CK: 535 U/L — ABNORMAL HIGH (ref 49–397)

## 2023-04-05 LAB — PHOSPHORUS: Phosphorus: 3.1 mg/dL (ref 2.5–4.6)

## 2023-04-05 MED ORDER — POTASSIUM CHLORIDE IN NACL 20-0.9 MEQ/L-% IV SOLN
INTRAVENOUS | Status: DC
  Filled 2023-04-05 (×3): qty 1000

## 2023-04-05 MED ORDER — POTASSIUM CHLORIDE CRYS ER 20 MEQ PO TBCR
40.0000 meq | EXTENDED_RELEASE_TABLET | ORAL | Status: AC
  Administered 2023-04-05 (×3): 40 meq via ORAL
  Filled 2023-04-05 (×3): qty 2

## 2023-04-05 MED ORDER — MAGNESIUM SULFATE 4 GM/100ML IV SOLN
4.0000 g | Freq: Once | INTRAVENOUS | Status: AC
  Administered 2023-04-05: 4 g via INTRAVENOUS
  Filled 2023-04-05: qty 100

## 2023-04-05 MED ORDER — DILTIAZEM HCL 60 MG PO TABS: 60.0000 mg | ORAL_TABLET | Freq: Three times a day (TID) | ORAL | Status: DC

## 2023-04-05 MED ORDER — CALCIUM GLUCONATE-NACL 2-0.675 GM/100ML-% IV SOLN
2.0000 g | Freq: Once | INTRAVENOUS | Status: AC
  Administered 2023-04-05: 2000 mg via INTRAVENOUS
  Filled 2023-04-05: qty 100

## 2023-04-05 MED ORDER — AMLODIPINE BESYLATE 10 MG PO TABS
10.0000 mg | ORAL_TABLET | Freq: Every day | ORAL | Status: DC
  Administered 2023-04-05 – 2023-04-06 (×2): 10 mg via ORAL
  Filled 2023-04-05 (×2): qty 1

## 2023-04-05 NOTE — Progress Notes (Signed)
 PROGRESS NOTE  Devon Hanson ZOX:[id number] DOB: 1980-04-19   PCP: Sharon Seller, NP  Patient is from: Home.  DOA: 04/04/2023 LOS: 0  Chief complaints Chief Complaint  Patient presents with   Nausea   Emesis   Diarrhea     Brief Narrative / Interim history: 43 year old M with PMH of PAF not on AC, alcohol use disorder, HTN, seizure, pancreatitis, anxiety and depression presenting with nausea, vomiting and diarrhea for about a week, and admitted with severe hypokalemia and hypomagnesemia in the setting of nausea, vomiting, diarrhea and dehydration.  Reports drinking only 1 beer in the last 1 week.  EtOH level less than 10.  K2.2.  Mg 0.9.  CBC significant for cytopenia.  Lipase 53.  Started on IV fluid, potassium and magnesium supplementation.  C. difficile antigen positive but toxin and PCR negative.  GIP pending.  Subjective: No major events overnight of this morning.  No further nausea, vomiting or diarrhea.  Reports sore throat.  Also reports some cough for about a week.  Denies chest pain or dyspnea.  Objective: Vitals:   04/04/23 1921 04/04/23 2335 04/05/23 0511 04/05/23 0912  BP: 124/80 114/69 127/74 131/83  Pulse: 86 92 78 88  Resp: 17   17  Temp: 99.1 F (37.3 C) 99.7 F (37.6 C) 98.3 F (36.8 C) 99.5 F (37.5 C)  TempSrc: Oral Oral Oral Oral  SpO2: 100% 100% 100% 98%    Examination:  GENERAL: No apparent distress.  Nontoxic. HEENT: MMM.  Vision and hearing grossly intact.  NECK: Supple.  No apparent JVD.  RESP:  No IWOB.  Fair aeration bilaterally. CVS:  RRR. Heart sounds normal.  ABD/GI/GU: BS+. Abd soft, NTND.  MSK/EXT:  Moves extremities. No apparent deformity. No edema.  SKIN: no apparent skin lesion or wound NEURO: Awake, alert and oriented appropriately.  No apparent focal neuro deficit. PSYCH: Calm. Normal affect.   Procedures:  None  Microbiology summarized: C. difficile antigen positive but toxin and PCR negative. GIP  pending COVID-19 and RVP pending  Assessment and plan: Intractable nausea, vomiting and diarrhea: Suspect viral gastroenteritis.  He had some URI symptoms for about a week.  Nausea, vomiting and diarrhea seems to have resolved.  Abdominal exam benign. -Check COVID-19 and a 20 pathogen RVP -Advance diet to soft -Change NS to NS with KCl given hypokalemia. -Follow GIP  Hypokalemia/hypomagnesemia/hypocalcemia: Likely due to GI losses from nausea, vomiting and diarrhea.  K2.9.  Mg 1.5 -P.o. KCl 40 mill equivalent every 3 hours x 3 -IV magnesium sulfate 4 g x 1 -IV calcium gluconate 2 g x 1 -Recheck in the afternoon  Alcohol use disorder: He states that he only had 1 drink in the last 1 week.  No withdrawal symptoms.  However, his AST is elevated -Continue CIWA with as needed Ativan -Continue multivitamin, thiamine and folic acid -Encourage alcohol cessation.  Normocytic anemia: Likely due to alcohol use.  No report of melena or hematochezia.  Hgb 15.0 on 11/27 9.5 on admission.  No interval value.  Dropped to 8.4 likely from hemodilution.  Recent Labs    10/30/22 0644 10/31/22 0713 11/01/22 0730 11/02/22 0911 12/16/22 1200 12/17/22 0622 12/18/22 0350 01/12/23 2015 04/04/23 1302 04/05/23 0654  HGB 11.3* 11.2* 11.1* 10.3* 15.8 14.9 15.0 15.0 9.5* 8.4*  -Check anemia panel -Monitor H&H  Hypertension: Normotensive - Continue Metoprolol and amlodipine.  History of paroxysmal A-fib: Currently in sinus rhythm.  Not on anticoagulation.  CHA2DS2-VASc score 1 -Continue Toprol-XL -Optimize electrolytes  Anxiety/depression: Stable.  Patient not taking Lexapro.  History of seizure -Continue home Keppra  Thrombocytopenia: Acute?  Stable -Continue monitoring   GERD -Continue PPI     There is no height or weight on file to calculate BMI.          DVT prophylaxis:  SCDs Start: 04/04/23 1530  Code Status: Full code Family Communication: None at bedside Level of care:  Telemetry Medical Status is: Observation The patient will require care spanning > 2 midnights and should be moved to inpatient because: Electrolyte derangements   Final disposition: Home Consultants:  None  55 minutes with more than 50% spent in reviewing records, counseling patient/family and coordinating care.   Sch Meds:  Scheduled Meds:  amLODipine  10 mg Oral Daily   folic acid  1 mg Oral Daily   levETIRAcetam  750 mg Oral BID   LORazepam  1 mg Intravenous Once   metoprolol succinate  50 mg Oral Daily   multivitamin with minerals  1 tablet Oral Daily   ondansetron (ZOFRAN) IV  4 mg Intravenous Once   pantoprazole  40 mg Oral Daily   potassium chloride  40 mEq Oral Q3H   thiamine  100 mg Oral Daily   Continuous Infusions:  0.9 % NaCl with KCl 20 mEq / L     calcium gluconate     magnesium sulfate bolus IVPB     PRN Meds:.acetaminophen **OR** acetaminophen, loperamide  Antimicrobials: Anti-infectives (From admission, onward)    None        I have personally reviewed the following labs and images: CBC: Recent Labs  Lab 04/04/23 1302 04/05/23 0654  WBC 4.6 4.6  NEUTROABS 3.2  --   HGB 9.5* 8.4*  HCT RESULTS UNAVAILABLE DUE TO INTERFERING SUBSTANCE 22.4*  MCV RESULTS UNAVAILABLE DUE TO INTERFERING SUBSTANCE 98.7  PLT 50* 57*   BMP &GFR Recent Labs  Lab 04/04/23 1302 04/04/23 1946 04/05/23 0654 04/05/23 0724  NA 133*  132* 132* 138  --   K 2.2*  2.2* 2.5* 2.9*  --   CL 90*  90* 92* 99  --   CO2 29  29 28 27   --   GLUCOSE 101*  102* 123* 102*  --   BUN 7  7 8 6   --   CREATININE 1.05  1.03 1.20 0.89  --   CALCIUM 6.8*  6.6* 6.7* 6.6*  --   MG 0.9*  0.9* 1.5* 1.5*  --   PHOS  --   --   --  3.1   CrCl cannot be calculated (Unknown ideal weight.). Liver & Pancreas: Recent Labs  Lab 04/04/23 1302 04/05/23 0654  AST 80* 53*  ALT 25 22  ALKPHOS 87 73  BILITOT 1.2 0.8  PROT 6.8 6.2*  ALBUMIN 3.7 3.2*   Recent Labs  Lab  04/04/23 1302  LIPASE 53*   No results for input(s): "AMMONIA" in the last 168 hours. Diabetic: No results for input(s): "HGBA1C" in the last 72 hours. No results for input(s): "GLUCAP" in the last 168 hours. Cardiac Enzymes: Recent Labs  Lab 04/05/23 0724  CKTOTAL 535*   No results for input(s): "PROBNP" in the last 8760 hours. Coagulation Profile: No results for input(s): "INR", "PROTIME" in the last 168 hours. Thyroid Function Tests: No results for input(s): "TSH", "T4TOTAL", "FREET4", "T3FREE", "THYROIDAB" in the last 72 hours. Lipid Profile: No results for input(s): "CHOL", "HDL", "LDLCALC", "TRIG", "CHOLHDL", "LDLDIRECT" in the last 72 hours. Anemia Panel: No results  for input(s): "VITAMINB12", "FOLATE", "FERRITIN", "TIBC", "IRON", "RETICCTPCT" in the last 72 hours. Urine analysis:    Component Value Date/Time   COLORURINE YELLOW 01/12/2023 2141   APPEARANCEUR CLEAR 01/12/2023 2141   LABSPEC 1.016 01/12/2023 2141   PHURINE 5.0 01/12/2023 2141   GLUCOSEU NEGATIVE 01/12/2023 2141   HGBUR NEGATIVE 01/12/2023 2141   BILIRUBINUR NEGATIVE 01/12/2023 2141   KETONESUR NEGATIVE 01/12/2023 2141   PROTEINUR NEGATIVE 01/12/2023 2141   NITRITE NEGATIVE 01/12/2023 2141   LEUKOCYTESUR NEGATIVE 01/12/2023 2141   Sepsis Labs: Invalid input(s): "PROCALCITONIN", "LACTICIDVEN"  Microbiology: Recent Results (from the past 240 hours)  C Difficile Quick Screen w PCR reflex     Status: Abnormal   Collection Time: 04/04/23  5:26 PM   Specimen: STOOL  Result Value Ref Range Status   C Diff antigen POSITIVE (A) NEGATIVE Final   C Diff toxin NEGATIVE NEGATIVE Final   C Diff interpretation Results are indeterminate. See PCR results.  Final    Comment: Performed at [facility], [address], [place], [place] [address]  C. Diff by PCR, Reflexed     Status: None   Collection Time: 04/04/23  5:26 PM  Result Value Ref Range Status   Toxigenic C. Difficile by PCR NEGATIVE NEGATIVE  Final    Comment: Patient is colonized with non toxigenic C. difficile. May not need treatment unless significant symptoms are present. Performed at [facility], [address], [place], [place] [address]     Radiology Studies: No results found.    Obrien Huskins T. Shylo Zamor [facility]  If 7PM-7AM, please contact night-coverage [email or web address] 04/05/2023, 9:39 AM

## 2023-04-05 NOTE — Progress Notes (Signed)
   04/05/23 1155  TOC Brief Assessment  Insurance and Status Reviewed  Patient has primary care physician Yes  Home environment has been reviewed spouse  Prior level of function: independent  Prior/Current Home Services No current home services  Social Drivers of Health Review SDOH reviewed no interventions necessary  Transition of care needs transition of care needs identified, TOC will continue to follow   Consult for medication assistance. Patient has insurance. NCM changed pharmacy to [facility] .

## 2023-04-05 NOTE — Progress Notes (Addendum)
 Critical value of potassium 2.5 at 2039 on 04/04/23 was communicated to Ulla Gallo NP through messaging. Acknowledged that the remaining potassium to be infused according to the [facility].

## 2023-04-05 NOTE — Plan of Care (Signed)

## 2023-04-06 ENCOUNTER — Other Ambulatory Visit (HOSPITAL_COMMUNITY): Payer: Self-pay

## 2023-04-06 DIAGNOSIS — R112 Nausea with vomiting, unspecified: Secondary | ICD-10-CM | POA: Diagnosis not present

## 2023-04-06 DIAGNOSIS — F10930 Alcohol use, unspecified with withdrawal, uncomplicated: Secondary | ICD-10-CM | POA: Diagnosis not present

## 2023-04-06 DIAGNOSIS — E878 Other disorders of electrolyte and fluid balance, not elsewhere classified: Secondary | ICD-10-CM

## 2023-04-06 DIAGNOSIS — E876 Hypokalemia: Secondary | ICD-10-CM | POA: Diagnosis not present

## 2023-04-06 LAB — CBC
HCT: 23.8 % — ABNORMAL LOW (ref 39.0–52.0)
Hemoglobin: 8.6 g/dL — ABNORMAL LOW (ref 13.0–17.0)
MCH: 37.1 pg — ABNORMAL HIGH (ref 26.0–34.0)
MCHC: 36.1 g/dL — ABNORMAL HIGH (ref 30.0–36.0)
MCV: 102.6 fL — ABNORMAL HIGH (ref 80.0–100.0)
Platelets: 74 10*3/uL — ABNORMAL LOW (ref 150–400)
RBC: 2.32 MIL/uL — ABNORMAL LOW (ref 4.22–5.81)
RDW: 19.5 % — ABNORMAL HIGH (ref 11.5–15.5)
WBC: 5.3 10*3/uL (ref 4.0–10.5)
nRBC: 0.8 % — ABNORMAL HIGH (ref 0.0–0.2)

## 2023-04-06 LAB — COMPREHENSIVE METABOLIC PANEL
ALT: 23 U/L (ref 0–44)
AST: 45 U/L — ABNORMAL HIGH (ref 15–41)
Albumin: 3 g/dL — ABNORMAL LOW (ref 3.5–5.0)
Alkaline Phosphatase: 64 U/L (ref 38–126)
Anion gap: 8 (ref 5–15)
BUN: <5 mg/dL — ABNORMAL LOW (ref 6–20)
CO2: 24 mmol/L (ref 22–32)
Calcium: 7.6 mg/dL — ABNORMAL LOW (ref 8.9–10.3)
Chloride: 110 mmol/L (ref 98–111)
Creatinine, Ser: 1.15 mg/dL (ref 0.61–1.24)
GFR, Estimated: >60 mL/min (ref >60)
Glucose, Bld: 99 mg/dL (ref 70–99)
Potassium: 3.6 mmol/L (ref 3.5–5.1)
Sodium: 142 mmol/L (ref 135–145)
Total Bilirubin: 0.5 mg/dL (ref 0.0–1.2)
Total Protein: 6.3 g/dL — ABNORMAL LOW (ref 6.5–8.1)

## 2023-04-06 LAB — PHOSPHORUS: Phosphorus: 3.2 mg/dL (ref 2.5–4.6)

## 2023-04-06 LAB — MAGNESIUM: Magnesium: 1.5 mg/dL — ABNORMAL LOW (ref 1.7–2.4)

## 2023-04-06 LAB — LIPASE, BLOOD: Lipase: 44 U/L (ref 11–51)

## 2023-04-06 LAB — CK: Total CK: 488 U/L — ABNORMAL HIGH (ref 49–397)

## 2023-04-06 MED ORDER — AMLODIPINE BESYLATE 10 MG PO TABS
10.0000 mg | ORAL_TABLET | Freq: Every day | ORAL | 1 refills | Status: DC
  Filled 2023-04-06: qty 90, 90d supply, fill #0

## 2023-04-06 MED ORDER — ADULT MULTIVITAMIN W/MINERALS CH: 1.0000 | ORAL_TABLET | Freq: Every day | ORAL | Status: AC

## 2023-04-06 MED ORDER — METOPROLOL SUCCINATE ER 50 MG PO TB24
50.0000 mg | ORAL_TABLET | Freq: Every day | ORAL | 0 refills | Status: DC
  Filled 2023-04-06: qty 90, 90d supply, fill #0

## 2023-04-06 MED ORDER — MAGNESIUM SULFATE 2 GM/50ML IV SOLN
2.0000 g | Freq: Once | INTRAVENOUS | Status: AC
  Administered 2023-04-06: 2 g via INTRAVENOUS
  Filled 2023-04-06: qty 50

## 2023-04-06 MED ORDER — FOLIC ACID 1 MG PO TABS
1.0000 mg | ORAL_TABLET | Freq: Every day | ORAL | 1 refills | Status: DC
  Filled 2023-04-06: qty 30, 30d supply, fill #0

## 2023-04-06 MED ORDER — THIAMINE HCL 100 MG PO TABS
100.0000 mg | ORAL_TABLET | Freq: Every day | ORAL | 0 refills | Status: DC
  Filled 2023-04-06: qty 30, 30d supply, fill #0

## 2023-04-06 MED ORDER — PANTOPRAZOLE SODIUM 40 MG PO TBEC
40.0000 mg | DELAYED_RELEASE_TABLET | Freq: Every day | ORAL | 0 refills | Status: DC
  Filled 2023-04-06: qty 30, 30d supply, fill #0

## 2023-04-06 MED ORDER — POTASSIUM CHLORIDE CRYS ER 20 MEQ PO TBCR
60.0000 meq | EXTENDED_RELEASE_TABLET | Freq: Once | ORAL | Status: AC
  Administered 2023-04-06: 60 meq via ORAL
  Filled 2023-04-06: qty 3

## 2023-04-06 NOTE — Plan of Care (Signed)
  Problem: Elimination: Goal: Will not experience complications related to urinary retention Outcome: Progressing   Problem: Nutrition: Goal: Adequate nutrition will be maintained Outcome: Progressing   Problem: Health Behavior/Discharge Planning: Goal: Ability to manage health-related needs will improve Outcome: Progressing   Problem: Education: Goal: Knowledge of General Education information will improve Description: Including pain rating scale, medication(s)/side effects and non-pharmacologic comfort measures Outcome: Progressing

## 2023-04-06 NOTE — Discharge Summary (Signed)
 Physician Discharge Summary  Rick Carruthers VHQ:[id number] DOB: Jan 10, 1981 DOA: 04/04/2023  PCP: Sharon Seller, NP  Admit date: 04/04/2023 Discharge date: 04/06/23  Admitted From: Home Disposition: Home Recommendations for Outpatient Follow-up:  Follow up with PCP in 1 to 2 weeks Check CBC and CMP at follow-up Reassess mood disorder and consider resuming SSRI if appropriate Please follow up on the following pending results: None  Home Health: No need identified Equipment/Devices: No need identified  Discharge Condition: Stable CODE STATUS: Full code  Follow-up Information     Sharon Seller, NP. Schedule an appointment as soon as possible for a visit in 1 week(s).   Specialty: Geriatric Medicine Contact information: [address] [place] [address] [phone]                 Hospital course 43 year old M with PMH of PAF not on AC, alcohol use disorder, HTN, seizure, pancreatitis, anxiety and depression presenting with nausea, vomiting and diarrhea for about a week, and admitted with severe hypokalemia and hypomagnesemia in the setting of nausea, vomiting, diarrhea and dehydration.  Reports drinking only 1 beer in the last 1 week although most of his labs suggest this recent alcohol use.  EtOH level less than 10.  K2.2.  Mg 0.9.  CBC significant for cytopenia.  Lipase 53.  Started on IV fluid, potassium and magnesium supplementation.   C. difficile antigen positive but toxin and PCR negative.  GIP, COVID-19 and a 20 pathogen RVP nonreactive.  On the day of discharge, patient's symptoms resolved.  Ultralight corrected.  Tolerated regular diet, and discharged home.  Provided with refills for most of his medication except Lexapro but he has not taken in over a month.  Encouraged to follow-up with PCP as soon as possible.  See individual problem list below for more.   Problems addressed during this hospitalization Intractable nausea, vomiting and  diarrhea: Suspect viral gastroenteritis.  He had some URI symptoms for about a week.  Nausea, vomiting and diarrhea seems to have resolved.  Abdominal exam benign. -Check COVID-19 and a 20 pathogen RVP -Advance diet to soft -Change NS to NS with KCl given hypokalemia. -Follow GIP   Hypokalemia/hypomagnesemia/hypocalcemia: Resolved except for mild hypomagnesemia.  K3.6.  Mg 1.5.  Calcium 7.6 (corrects to 8.5). -Received p.o. KCl 40 mill equivalent x 1 and IV magnesium sulfate 2 g x 1 prior to discharge.   Alcohol use disorder: He states that he only had 1 drink in the last 1 week.  No withdrawal symptoms.  -Encourage alcohol cessation -Continue multivitamin, thiamine and folic acid   Normocytic anemia: Likely due to alcohol use.  No report of melena or hematochezia.  Hgb 15.0 on 11/27 9.5 on admission.  No interval value.  H&H stable after initial drop from hemodilution.  Suspect this is from chronic alcohol use Recent Labs    10/31/22 0713 11/01/22 0730 11/02/22 0911 12/16/22 1200 12/17/22 0622 12/18/22 0350 01/12/23 2015 04/04/23 1302 04/05/23 0654 04/06/23 0600  HGB 11.2* 11.1* 10.3* 15.8 14.9 15.0 15.0 9.5* 8.4* 8.6*  -Encourage alcohol cessation -Recheck CBC in 1 week    Hypertension: Normotensive - Continue Metoprolol and amlodipine.  Refilled.   History of paroxysmal A-fib: Currently in sinus rhythm.  Not on anticoagulation.  CHA2DS2-VASc score 1 -Continue Toprol-XL.  Refilled.   Anxiety/depression: Stable.  Patient not taking Lexapro. -Encouraged to follow-up with PCP.   History of seizure -Continue home Keppra.  Refilled.   Thrombocytopenia: Likely from ongoing alcohol use.  Improved. -Recheck CBC in 1 week   GERD -Continue PPI            Time spent 35 minutes  Vital signs Vitals:   04/05/23 2126 04/06/23 0436 04/06/23 0756 04/06/23 0949  BP: (!) 148/84 131/86 121/83   Pulse: 96 87 84   Temp: 99.6 F (37.6 C) 98.6 F (37 C) 98.7 F (37.1 C)    Resp: 18 17 16    Height:    5\' 8"  (1.727 m)  Weight:    93.4 kg  SpO2: 100% 100% 93%   TempSrc: Oral Oral Oral   BMI (Calculated):    31.33     Discharge exam  GENERAL: No apparent distress.  Nontoxic. HEENT: MMM.  Vision and hearing grossly intact.  NECK: Supple.  No apparent JVD.  RESP:  No IWOB.  Fair aeration bilaterally. CVS:  RRR. Heart sounds normal.  ABD/GI/GU: BS+. Abd soft, NTND.  MSK/EXT:  Moves extremities. No apparent deformity. No edema.  SKIN: no apparent skin lesion or wound NEURO: Awake and alert. Oriented appropriately.  No apparent focal neuro deficit. PSYCH: Calm. Normal affect.   Discharge Instructions Discharge Instructions     Diet general   Complete by: As directed    Discharge instructions   Complete by: As directed    It has been a pleasure taking care of you!  You were hospitalized due to low potassium, magnesium and calcium in the setting of nausea, vomiting and diarrhea.  We strongly recommend you avoid alcohol and maintain good hydration.  We also recommend you follow-up with your primary care doctor or establish care with PCP as soon as possible.   Take care,   Increase activity slowly   Complete by: As directed       Allergies as of 04/06/2023   No Known Allergies      Medication List     STOP taking these medications    thiamine 100 MG tablet Commonly known as: Vitamin B-1 Replaced by: thiamine 100 MG tablet       TAKE these medications    amLODipine 10 MG tablet Commonly known as: NORVASC Take 1 tablet (10 mg total) by mouth daily.   COLD & FLU PO Take 2 tablets by mouth every 4 (four) hours as needed.   escitalopram 5 MG tablet Commonly known as: LEXAPRO Take 1 tablet (5 mg total) by mouth daily.   folic acid 1 MG tablet Commonly known as: FOLVITE Take 1 tablet (1 mg total) by mouth daily.   levETIRAcetam 750 MG tablet Commonly known as: KEPPRA Take 1 tablet (750 mg total) by mouth 2 (two) times daily.    metoprolol succinate 50 MG 24 hr tablet Commonly known as: TOPROL-XL Take 1 tablet (50 mg total) by mouth daily.   multivitamin with minerals Tabs tablet Take 1 tablet by mouth daily.   pantoprazole 40 MG tablet Commonly known as: PROTONIX Take 1 tablet (40 mg total) by mouth daily.   thiamine 100 MG tablet Commonly known as: VITAMIN B1 Take 1 tablet (100 mg total) by mouth daily. Replaces: thiamine 100 MG tablet        Consultations: None  Procedures/Studies:   No results found.     The results of significant diagnostics from this hospitalization (including imaging, microbiology, ancillary and laboratory) are listed below for reference.     Microbiology: Recent Results (from the past 240 hours)  C Difficile Quick Screen w PCR reflex     Status: Abnormal  Collection Time: 04/04/23  5:26 PM   Specimen: STOOL  Result Value Ref Range Status   C Diff antigen POSITIVE (A) NEGATIVE Final   C Diff toxin NEGATIVE NEGATIVE Final   C Diff interpretation Results are indeterminate. See PCR results.  Final    Comment: Performed at [facility], [address] [place], [place] [address]  Gastrointestinal Panel by PCR , Stool     Status: None   Collection Time: 04/04/23  5:26 PM   Specimen: Stool  Result Value Ref Range Status   Campylobacter species NOT DETECTED NOT DETECTED Final   Plesimonas shigelloides NOT DETECTED NOT DETECTED Final   Salmonella species NOT DETECTED NOT DETECTED Final   Yersinia enterocolitica NOT DETECTED NOT DETECTED Final   Vibrio species NOT DETECTED NOT DETECTED Final   Vibrio cholerae NOT DETECTED NOT DETECTED Final   Enteroaggregative E coli (EAEC) NOT DETECTED NOT DETECTED Final   Enteropathogenic E coli (EPEC) NOT DETECTED NOT DETECTED Final   Enterotoxigenic E coli (ETEC) NOT DETECTED NOT DETECTED Final   Shiga like toxin producing E coli (STEC) NOT DETECTED NOT DETECTED Final   Shigella/Enteroinvasive E coli (EIEC) NOT DETECTED  NOT DETECTED Final   Cryptosporidium NOT DETECTED NOT DETECTED Final   Cyclospora cayetanensis NOT DETECTED NOT DETECTED Final   Entamoeba histolytica NOT DETECTED NOT DETECTED Final   Giardia lamblia NOT DETECTED NOT DETECTED Final   Adenovirus F40/41 NOT DETECTED NOT DETECTED Final   Astrovirus NOT DETECTED NOT DETECTED Final   Norovirus GI/GII NOT DETECTED NOT DETECTED Final   Rotavirus A NOT DETECTED NOT DETECTED Final   Sapovirus (I, II, IV, and V) NOT DETECTED NOT DETECTED Final    Comment: Performed at [facility], [address] [place], [place] [address]  C. Diff by PCR, Reflexed     Status: None   Collection Time: 04/04/23  5:26 PM  Result Value Ref Range Status   Toxigenic C. Difficile by PCR NEGATIVE NEGATIVE Final    Comment: Patient is colonized with non toxigenic C. difficile. May not need treatment unless significant symptoms are present. Performed at [facility], [address] [place], [place] [address]   Respiratory (~20 pathogens) panel by PCR     Status: None   Collection Time: 04/05/23  7:59 AM   Specimen: Nasopharyngeal Swab; Respiratory  Result Value Ref Range Status   Adenovirus NOT DETECTED NOT DETECTED Final   Coronavirus 229E NOT DETECTED NOT DETECTED Final    Comment: (NOTE) The Coronavirus on the Respiratory Panel, DOES NOT test for the novel  Coronavirus (2019 nCoV)    Coronavirus HKU1 NOT DETECTED NOT DETECTED Final   Coronavirus NL63 NOT DETECTED NOT DETECTED Final   Coronavirus OC43 NOT DETECTED NOT DETECTED Final   Metapneumovirus NOT DETECTED NOT DETECTED Final   Rhinovirus / Enterovirus NOT DETECTED NOT DETECTED Final   Influenza A NOT DETECTED NOT DETECTED Final   Influenza B NOT DETECTED NOT DETECTED Final   Parainfluenza Virus 1 NOT DETECTED NOT DETECTED Final   Parainfluenza Virus 2 NOT DETECTED NOT DETECTED Final   Parainfluenza Virus 3 NOT DETECTED NOT DETECTED Final   Parainfluenza Virus 4 NOT DETECTED NOT DETECTED  Final   Respiratory Syncytial Virus NOT DETECTED NOT DETECTED Final   Bordetella pertussis NOT DETECTED NOT DETECTED Final   Bordetella Parapertussis NOT DETECTED NOT DETECTED Final   Chlamydophila pneumoniae NOT DETECTED NOT DETECTED Final   Mycoplasma pneumoniae NOT DETECTED NOT DETECTED Final  Comment: Performed at [facility], [address] [place], [place] [address]  SARS Coronavirus 2 by RT PCR (hospital order, performed in [facility] hospital lab) *cepheid single result test* Anterior Nasal Swab     Status: None   Collection Time: 04/05/23  7:59 AM   Specimen: Anterior Nasal Swab  Result Value Ref Range Status   SARS Coronavirus 2 by RT PCR NEGATIVE NEGATIVE Final    Comment: Performed at [facility], [address], [place], [place] [address]     Labs:  CBC: Recent Labs  Lab 04/04/23 1302 04/05/23 0654 04/06/23 0600  WBC 4.6 4.6 5.3  NEUTROABS 3.2  --   --   HGB 9.5* 8.4* 8.6*  HCT RESULTS UNAVAILABLE DUE TO INTERFERING SUBSTANCE 22.4* 23.8*  MCV RESULTS UNAVAILABLE DUE TO INTERFERING SUBSTANCE 98.7 102.6*  PLT 50* 57* 74*   BMP &GFR Recent Labs  Lab 04/04/23 1302 04/04/23 1946 04/05/23 0654 04/05/23 0724 04/05/23 1613 04/06/23 0600  NA 133*  132* 132* 138  --  139 142  K 2.2*  2.2* 2.5* 2.9*  --  3.3* 3.6  CL 90*  90* 92* 99  --  104 110  CO2 29  29 28 27   --  26 24  GLUCOSE 101*  102* 123* 102*  --  107* 99  BUN 7  7 8 6   --  <5* <5*  CREATININE 1.05  1.03 1.20 0.89  --  0.80 1.15  CALCIUM 6.8*  6.6* 6.7* 6.6*  --  7.3* 7.6*  MG 0.9*  0.9* 1.5* 1.5*  --  2.4 1.5*  PHOS  --   --   --  3.1  --  3.2   Estimated Creatinine Clearance: 94.7 mL/min (by C-G formula based on SCr of 1.15 mg/dL). Liver & Pancreas: Recent Labs  Lab 04/04/23 1302 04/05/23 0654 04/05/23 1613 04/06/23 0600  AST 80* 53* 57* 45*  ALT 25 22 23 23   ALKPHOS 87 73 73 64  BILITOT 1.2 0.8 0.5 0.5  PROT 6.8 6.2* 6.3* 6.3*  ALBUMIN 3.7 3.2* 3.2* 3.0*    Recent Labs  Lab 04/04/23 1302 04/06/23 0600  LIPASE 53* 44   No results for input(s): "AMMONIA" in the last 168 hours. Diabetic: No results for input(s): "HGBA1C" in the last 72 hours. No results for input(s): "GLUCAP" in the last 168 hours. Cardiac Enzymes: Recent Labs  Lab 04/05/23 0724 04/06/23 0600  CKTOTAL 535* 488*   No results for input(s): "PROBNP" in the last 8760 hours. Coagulation Profile: No results for input(s): "INR", "PROTIME" in the last 168 hours. Thyroid Function Tests: No results for input(s): "TSH", "T4TOTAL", "FREET4", "T3FREE", "THYROIDAB" in the last 72 hours. Lipid Profile: No results for input(s): "CHOL", "HDL", "LDLCALC", "TRIG", "CHOLHDL", "LDLDIRECT" in the last 72 hours. Anemia Panel: Recent Labs    04/04/23 1622 04/05/23 0653  VITAMINB12 562  --   FOLATE 17.7  --   FERRITIN 1,395*  --   TIBC 267  --   IRON 38*  --   RETICCTPCT  --  2.1   Urine analysis:    Component Value Date/Time   COLORURINE YELLOW 01/12/2023 2141   APPEARANCEUR CLEAR 01/12/2023 2141   LABSPEC 1.016 01/12/2023 2141   PHURINE 5.0 01/12/2023 2141   GLUCOSEU NEGATIVE 01/12/2023 2141   HGBUR NEGATIVE 01/12/2023 2141   BILIRUBINUR NEGATIVE 01/12/2023 2141   KETONESUR NEGATIVE 01/12/2023 2141   PROTEINUR NEGATIVE 01/12/2023 2141   NITRITE NEGATIVE 01/12/2023 2141  LEUKOCYTESUR NEGATIVE 01/12/2023 2141   Sepsis Labs: Invalid input(s): "PROCALCITONIN", "LACTICIDVEN"   SIGNED:  Almon Hercules, MD  [facility] 04/06/2023, 11:31 AM

## 2023-05-12 ENCOUNTER — Ambulatory Visit: Payer: MEDICAID | Admitting: Neurology

## 2023-08-04 ENCOUNTER — Other Ambulatory Visit: Payer: Self-pay | Admitting: Neurology

## 2023-08-04 NOTE — Telephone Encounter (Signed)
 Pt last seen 05/12/22. Next appt not until 12/13/23. Called pt at [phone]. LVM for him to call back to schedule sooner appt.   Called spouse/Arriola,Vaniqua at [phone] . Relayed he needs sooner appt. Offering 08/10/23 at 930a. She will have him call back.  Last refilled med 04/24/23 #180.

## 2023-08-09 ENCOUNTER — Telehealth: Payer: Self-pay | Admitting: Neurology

## 2023-08-09 NOTE — Telephone Encounter (Signed)
 Request to r/s appointment due to conflict with insurance.

## 2023-08-09 NOTE — Telephone Encounter (Signed)
 Called spouse again at [phone]. States husband able to take appt. I scheduled and asked he check in at 9am. She verbalized understanding.

## 2023-08-10 ENCOUNTER — Ambulatory Visit: Payer: MEDICAID | Admitting: Neurology

## 2023-08-23 ENCOUNTER — Emergency Department (HOSPITAL_COMMUNITY): Payer: MEDICAID

## 2023-08-23 ENCOUNTER — Emergency Department (HOSPITAL_COMMUNITY)
Admission: EM | Admit: 2023-08-23 | Discharge: 2023-08-23 | Disposition: A | Discharge status: Home / Self Care | Payer: MEDICAID | Attending: Emergency Medicine | Admitting: Emergency Medicine

## 2023-08-23 ENCOUNTER — Encounter (HOSPITAL_COMMUNITY): Payer: Self-pay

## 2023-08-23 ENCOUNTER — Other Ambulatory Visit: Payer: Self-pay

## 2023-08-23 DIAGNOSIS — K7010 Alcoholic hepatitis without ascites: Secondary | ICD-10-CM | POA: Insufficient documentation

## 2023-08-23 DIAGNOSIS — I10 Essential (primary) hypertension: Secondary | ICD-10-CM | POA: Insufficient documentation

## 2023-08-23 DIAGNOSIS — R112 Nausea with vomiting, unspecified: Secondary | ICD-10-CM

## 2023-08-23 DIAGNOSIS — Z79899 Other long term (current) drug therapy: Secondary | ICD-10-CM | POA: Diagnosis not present

## 2023-08-23 DIAGNOSIS — M791 Myalgia, unspecified site: Secondary | ICD-10-CM | POA: Diagnosis not present

## 2023-08-23 LAB — CBC
HCT: 34.4 % — ABNORMAL LOW (ref 39.0–52.0)
Hemoglobin: 12.6 g/dL — ABNORMAL LOW (ref 13.0–17.0)
MCH: 33.1 pg (ref 26.0–34.0)
MCHC: 36.6 g/dL — ABNORMAL HIGH (ref 30.0–36.0)
MCV: 90.3 fL (ref 80.0–100.0)
Platelets: 297 K/uL (ref 150–400)
RBC: 3.81 MIL/uL — ABNORMAL LOW (ref 4.22–5.81)
RDW: 11.9 % (ref 11.5–15.5)
WBC: 6.8 K/uL (ref 4.0–10.5)
nRBC: 0 % (ref 0.0–0.2)

## 2023-08-23 LAB — URINALYSIS, ROUTINE W REFLEX MICROSCOPIC
Bilirubin Urine: NEGATIVE
Glucose, UA: NEGATIVE mg/dL
Hgb urine dipstick: NEGATIVE
Ketones, ur: NEGATIVE mg/dL
Leukocytes,Ua: NEGATIVE
Nitrite: NEGATIVE
Protein, ur: NEGATIVE mg/dL
Specific Gravity, Urine: 1.018 (ref 1.005–1.030)
pH: 6 (ref 5.0–8.0)

## 2023-08-23 LAB — HEPATITIS PANEL, ACUTE
HCV Ab: NONREACTIVE
Hep A IgM: NONREACTIVE
Hep B C IgM: NONREACTIVE
Hepatitis B Surface Ag: NONREACTIVE

## 2023-08-23 LAB — COMPREHENSIVE METABOLIC PANEL WITH GFR
ALT: 282 U/L — ABNORMAL HIGH (ref 0–44)
AST: 841 U/L — ABNORMAL HIGH (ref 15–41)
Albumin: 4.4 g/dL (ref 3.5–5.0)
Alkaline Phosphatase: 194 U/L — ABNORMAL HIGH (ref 38–126)
Anion gap: 17 — ABNORMAL HIGH (ref 5–15)
BUN: 13 mg/dL (ref 6–20)
CO2: 22 mmol/L (ref 22–32)
Calcium: 10.2 mg/dL (ref 8.9–10.3)
Chloride: 89 mmol/L — ABNORMAL LOW (ref 98–111)
Creatinine, Ser: 1.13 mg/dL (ref 0.61–1.24)
GFR, Estimated: >60 mL/min (ref >60)
Glucose, Bld: 102 mg/dL — ABNORMAL HIGH (ref 70–99)
Potassium: 3.2 mmol/L — ABNORMAL LOW (ref 3.5–5.1)
Sodium: 128 mmol/L — ABNORMAL LOW (ref 135–145)
Total Bilirubin: 2 mg/dL — ABNORMAL HIGH (ref 0.0–1.2)
Total Protein: 8.3 g/dL — ABNORMAL HIGH (ref 6.5–8.1)

## 2023-08-23 LAB — RAPID URINE DRUG SCREEN, HOSP PERFORMED
Amphetamines: NOT DETECTED
Barbiturates: NOT DETECTED
Benzodiazepines: NOT DETECTED
Cocaine: NOT DETECTED
Opiates: NOT DETECTED
Tetrahydrocannabinol: NOT DETECTED

## 2023-08-23 LAB — LIPASE, BLOOD: Lipase: 59 U/L — ABNORMAL HIGH (ref 11–51)

## 2023-08-23 LAB — ACETAMINOPHEN LEVEL: Acetaminophen (Tylenol), Serum: <10 ug/mL — ABNORMAL LOW (ref 10–30)

## 2023-08-23 LAB — MAGNESIUM: Magnesium: 1.4 mg/dL — ABNORMAL LOW (ref 1.7–2.4)

## 2023-08-23 LAB — ETHANOL: Alcohol, Ethyl (B): <15 mg/dL (ref <15)

## 2023-08-23 MED ORDER — LEVETIRACETAM (KEPPRA) 500 MG/5 ML ADULT IV PUSH
1000.0000 mg | Freq: Once | INTRAVENOUS | Status: AC
  Administered 2023-08-23: 1000 mg via INTRAVENOUS
  Filled 2023-08-23: qty 10

## 2023-08-23 MED ORDER — CHLORDIAZEPOXIDE HCL 25 MG PO CAPS
25.0000 mg | ORAL_CAPSULE | Freq: Once | ORAL | Status: AC
  Administered 2023-08-23: 25 mg via ORAL
  Filled 2023-08-23: qty 1

## 2023-08-23 MED ORDER — METOCLOPRAMIDE HCL 5 MG/ML IJ SOLN
10.0000 mg | Freq: Once | INTRAMUSCULAR | Status: AC
  Administered 2023-08-23: 10 mg via INTRAVENOUS
  Filled 2023-08-23: qty 2

## 2023-08-23 MED ORDER — LACTATED RINGERS IV BOLUS
2000.0000 mL | Freq: Once | INTRAVENOUS | Status: AC
  Administered 2023-08-23: 2000 mL via INTRAVENOUS

## 2023-08-23 MED ORDER — MAGNESIUM SULFATE 2 GM/50ML IV SOLN
2.0000 g | Freq: Once | INTRAVENOUS | Status: AC
  Administered 2023-08-23: 2 g via INTRAVENOUS
  Filled 2023-08-23: qty 50

## 2023-08-23 MED ORDER — IOHEXOL 350 MG/ML SOLN
75.0000 mL | Freq: Once | INTRAVENOUS | Status: AC | PRN
  Administered 2023-08-23: 75 mL via INTRAVENOUS

## 2023-08-23 MED ORDER — METOCLOPRAMIDE HCL 10 MG PO TABS: 10.0000 mg | ORAL_TABLET | Freq: Four times a day (QID) | ORAL | 0 refills | Status: DC

## 2023-08-23 NOTE — ED Triage Notes (Addendum)
 Pt here for n/v since Saturday. Denies abd pain. No HA/SHOB/CP. Axox4. EMS gave 4mg  of zofran 

## 2023-08-23 NOTE — Discharge Instructions (Addendum)
 Your lab work showed elevated liver enzymes.  Although this can be caused by multiple factors, your imaging and lab work do not point to any obstructive or infectious cause.  Abnormal lab results may be due to your alcohol  use.  Cut back on alcohol  is much as possible.  You should get repeat lab work done later this week or early next week to ensure that your enzymes are trending down.  There is a telephone number below that you can call to follow-up with a gastroenterologist.  They specialize in liver and other organs of the digestive system.  Call to set up an appointment.  A prescription for antinausea medication was sent to your pharmacy.  Take this as needed.  Drink plenty fluids to stay hydrated.  Return to the emergency department for any new or worsening symptoms of concern.

## 2023-08-23 NOTE — ED Provider Notes (Signed)
 [place] EMERGENCY DEPARTMENT AT [facility] Provider Note   CSN: [id number] Arrival date & time: 08/23/23  1112     Patient presents with: Nausea and Emesis   Devon Hanson is a 43 y.o. male.    Emesis Associated symptoms: myalgias   Patient presents for nausea and vomiting.  Medical history includes alcohol  abuse, seizures, GERD, HTN, atrial fibrillation, anxiety, depression.  He does continue to drink alcohol , typically on his days off.  During this days, he will have 3-4 drinks.  His last drink was 3 days ago.  It was around this time, that he developed nausea and vomiting.  Nausea and vomiting has been persistent since then.  He has had diarrhea for the past 2 days.  He denies any associated abdominal pain.  He states that he has had very poor p.o. tolerance.  He has not been able to keep down any foods.  He will often vomit after drinking or taking medicine.  He has developed some cramping in his muscles.  He arrives in the ED today via EMS.  He was given 4 mg of Zofran  prior to arrival.  This did improve his nausea but it has since returned.    Prior to Admission medications   Medication Sig Start Date End Date Taking? Authorizing Provider  metoCLOPramide  (REGLAN ) 10 MG tablet Take 1 tablet (10 mg total) by mouth every 6 (six) hours. 08/23/23  Yes Melvenia Motto, MD  amLODipine  (NORVASC ) 10 MG tablet Take 1 tablet (10 mg total) by mouth daily. 04/06/23   Gonfa, Taye T, MD  escitalopram  (LEXAPRO ) 5 MG tablet Take 1 tablet (5 mg total) by mouth daily. Patient not taking: Reported on 04/04/2023 11/03/22   Samtani, Jai-Gurmukh, MD  folic acid  (FOLVITE ) 1 MG tablet Take 1 tablet (1 mg total) by mouth daily. 04/06/23   Gonfa, Taye T, MD  levETIRAcetam  (KEPPRA ) 750 MG tablet Take 1 tablet (750 mg total) by mouth 2 (two) times daily. Call [phone] to schedule sooner appointment for ongoing refills 08/04/23   Sater, Charlie LABOR, MD  metoprolol  succinate (TOPROL -XL) 50 MG 24 hr tablet  Take 1 tablet (50 mg total) by mouth daily. 04/06/23   Gonfa, Taye T, MD  Multiple Vitamin (MULTIVITAMIN WITH MINERALS) TABS tablet Take 1 tablet by mouth daily. 04/06/23   Gonfa, Taye T, MD  pantoprazole  (PROTONIX ) 40 MG tablet Take 1 tablet (40 mg total) by mouth daily. 04/06/23   Gonfa, Taye T, MD  Phenylephrine -DM-GG-APAP (COLD & FLU PO) Take 2 tablets by mouth every 4 (four) hours as needed.    [provider]  thiamine  (VITAMIN B1) 100 MG tablet Take 1 tablet (100 mg total) by mouth daily. 04/06/23   Gonfa, Taye T, MD    Allergies: Patient has no known allergies.    Review of Systems  Gastrointestinal:  Positive for nausea and vomiting.  Musculoskeletal:  Positive for myalgias.  All other systems reviewed and are negative.   Updated Vital Signs BP (!) 152/94 (BP Location: Left Arm)   Pulse (!) 107   Temp 99.3 F (37.4 C) (Oral)   Resp 16   Ht 5' 8 (1.727 m)   SpO2 100%   BMI 31.32 kg/m   Physical Exam Vitals and nursing note reviewed.  Constitutional:      General: He is not in acute distress.    Appearance: Normal appearance. He is well-developed. He is not ill-appearing, toxic-appearing or diaphoretic.  HENT:     Head: Normocephalic and  atraumatic.     Right Ear: External ear normal.     Left Ear: External ear normal.     Nose: Nose normal.     Mouth/Throat:     Mouth: Mucous membranes are moist.  Eyes:     Extraocular Movements: Extraocular movements intact.     Conjunctiva/sclera: Conjunctivae normal.  Cardiovascular:     Rate and Rhythm: Normal rate and regular rhythm.  Pulmonary:     Effort: Pulmonary effort is normal. No respiratory distress.  Abdominal:     General: There is no distension.     Palpations: Abdomen is soft.     Tenderness: There is no abdominal tenderness.  Musculoskeletal:        General: No swelling. Normal range of motion.     Cervical back: Normal range of motion and neck supple.  Skin:    General: Skin is warm and dry.      Coloration: Skin is not jaundiced or pale.  Neurological:     General: No focal deficit present.     Mental Status: He is alert and oriented to person, place, and time.     Cranial Nerves: No cranial nerve deficit.     Sensory: No sensory deficit.     Motor: No weakness.     Coordination: Coordination normal.  Psychiatric:        Mood and Affect: Mood normal.        Behavior: Behavior normal.     (all labs ordered are listed, but only abnormal results are displayed) Labs Reviewed  LIPASE, BLOOD - Abnormal; Notable for the following components:      Result Value   Lipase 59 (*)    All other components within normal limits  COMPREHENSIVE METABOLIC PANEL WITH GFR - Abnormal; Notable for the following components:   Sodium 128 (*)    Potassium 3.2 (*)    Chloride 89 (*)    Glucose, Bld 102 (*)    Total Protein 8.3 (*)    AST 841 (*)    ALT 282 (*)    Alkaline Phosphatase 194 (*)    Total Bilirubin 2.0 (*)    Anion gap 17 (*)    All other components within normal limits  CBC - Abnormal; Notable for the following components:   RBC 3.81 (*)    Hemoglobin 12.6 (*)    HCT 34.4 (*)    MCHC 36.6 (*)    All other components within normal limits  MAGNESIUM  - Abnormal; Notable for the following components:   Magnesium  1.4 (*)    All other components within normal limits  ACETAMINOPHEN  LEVEL - Abnormal; Notable for the following components:   Acetaminophen  (Tylenol ), Serum <10 (*)    All other components within normal limits  URINALYSIS, ROUTINE W REFLEX MICROSCOPIC  RAPID URINE DRUG SCREEN, HOSP PERFORMED  ETHANOL  HEPATITIS PANEL, ACUTE    EKG: EKG Interpretation Date/Time:  Tuesday August 23 2023 15:59:39 EDT Ventricular Rate:  100 PR Interval:  166 QRS Duration:  82 QT Interval:  350 QTC Calculation: 451 R Axis:   14  Text Interpretation: Normal sinus rhythm Septal infarct , age undetermined Abnormal ECG Confirmed by Melvenia Motto [id number]) on 08/23/2023 5:07:59  PM  Radiology: CT ABDOMEN PELVIS W CONTRAST Result Date: 08/23/2023 CLINICAL DATA:  Abdominal pain, acute, nonlocalized EXAM: CT ABDOMEN AND PELVIS WITH CONTRAST TECHNIQUE: Multidetector CT imaging of the abdomen and pelvis was performed using the standard protocol following bolus administration of intravenous contrast. RADIATION  DOSE REDUCTION: This exam was performed according to the departmental dose-optimization program which includes automated exposure control, adjustment of the mA and/or kV according to patient size and/or use of iterative reconstruction technique. CONTRAST:  75mL OMNIPAQUE  IOHEXOL  350 MG/ML SOLN COMPARISON:  CT abdomen pelvis 11/01/2022 FINDINGS: Lower chest: Likely tiny hiatal hernia.  No acute abnormality. Hepatobiliary: Enlarged liver measuring up to 20 cm. No focal liver abnormality. No gallstones, gallbladder wall thickening, or pericholecystic fluid. No biliary dilatation. Pancreas: No focal lesion. Normal pancreatic contour. No surrounding inflammatory changes. No main pancreatic ductal dilatation. Spleen: Normal in size without focal abnormality. Adrenals/Urinary Tract: No adrenal nodule bilaterally. Bilateral kidneys enhance symmetrically. No hydronephrosis. No hydroureter. The urinary bladder is unremarkable. Stomach/Bowel: Stomach is within normal limits. No evidence of small bowel wall thickening or dilatation. Fluid within the lumen of the large bowel. Mild bowel wall thickening and mucosal hyperemia of the rectum. Loss of left large bowel haustra. Appendix appears normal. Vascular/Lymphatic: No abdominal aorta or iliac aneurysm. Mild atherosclerotic plaque of the aorta and its branches. No abdominal, pelvic, or inguinal lymphadenopathy. Reproductive: Prostate is unremarkable. Other: No intraperitoneal free fluid. No intraperitoneal free gas. No organized fluid collection. Musculoskeletal: No abdominal wall hernia or abnormality. No suspicious lytic or blastic osseous lesions.  No acute displaced fracture. Chronic thoracolumbar vertebral body height loss with concavity of the visualized thoracic spine and L1 vertebral body. IMPRESSION: 1. Diarrheal state with question developing proctitis. 2. Possible tiny hiatal hernia. 3. Hepatomegaly. 4.  Aortic Atherosclerosis (ICD10-I70.0). Electronically Signed   By: Morgane  Naveau M.D.   On: 08/23/2023 18:47     Procedures   Medications Ordered in the ED  lactated ringers  bolus 2,000 mL (0 mLs Intravenous Stopped 08/23/23 1652)  levETIRAcetam  (KEPPRA ) undiluted injection 1,000 mg (1,000 mg Intravenous Given 08/23/23 1551)  metoCLOPramide  (REGLAN ) injection 10 mg (10 mg Intravenous Given 08/23/23 1551)  chlordiazePOXIDE  (LIBRIUM ) capsule 25 mg (25 mg Oral Given 08/23/23 1551)  magnesium  sulfate IVPB 2 g 50 mL (0 g Intravenous Stopped 08/23/23 1821)  iohexol  (OMNIPAQUE ) 350 MG/ML injection 75 mL (75 mLs Intravenous Contrast Given 08/23/23 1828)                                    Medical Decision Making Amount and/or Complexity of Data Reviewed Labs: ordered. Radiology: ordered.  Risk Prescription drug management.   This patient presents to the ED for concern of nausea and vomiting, this involves an extensive number of treatment options, and is a complaint that carries with it a high risk of complications and morbidity.  The differential diagnosis includes gastritis, GERD, alcohol  withdrawal, pancreatitis, cholecystitis, dehydration, metabolic derangements   Co morbidities / Chronic conditions that complicate the patient evaluation  alcohol  abuse, seizures, GERD, HTN, atrial fibrillation, anxiety, depression   Additional history obtained:  Additional history obtained from EMR External records from outside source obtained and reviewed including N/A   Lab Tests:  I Ordered, and personally interpreted labs.  The pertinent results include: Creatinine is normal.  Patient does have hypomagnesemia and hyponatremia.  I suspect  this is secondary to alcohol  use.  His hepatobiliary enzymes are markedly elevated.  Although cause could be secondary to alcoholic hepatitis.  Other causes were searched while in the ED.   Imaging Studies ordered:  I ordered imaging studies including CT of abdomen and pelvis I independently visualized and interpreted imaging which showed hepatomegaly, diarrheal state, questionable  proctitis, questionable small hiatal hernia I agree with the radiologist interpretation   Cardiac Monitoring: / EKG:  The patient was maintained on a cardiac monitor.  I personally viewed and interpreted the cardiac monitored which showed an underlying rhythm of: Sinus rhythm   Problem List / ED Course / Critical interventions / Medication management  Patient presenting for 3 days of nausea, vomiting in addition to 2 days of diarrhea.  Vital signs on arrival notable for hypertension and tachycardia.  His last alcoholic drink was 3 days ago.  On exam, he is well-appearing.  He is mildly tremulous.  He endorses current nausea but denies any other current symptoms.  Given his fluid losses and poor p.o. intake, 2 L of IV fluid were ordered for hydration.  Given his recurrence of nausea, Reglan  was ordered.  Will give dose of Librium  for possible withdrawal component of his recent symptoms.  Patient is also on daily Keppra  and has not been able to keep down his medications.  IV Keppra  was ordered for seizure prophylaxis.  Workup was initiated.  Patient's lab work was notable for hypomagnesemia and hyponatremia.  I suspect this is secondary to alcohol  use.  His hepatobiliary enzymes are markedly elevated.  Although cause could be secondary to alcoholic hepatitis.  Other causes were searched while in the ED. CT of abdomen and pelvis was ordered.  Results did not show any evidence of biliary obstruction.  He does have hepatomegaly, likely secondary to alcoholic hepatic steatosis.  On reassessment, patient states that his nausea  has resolved.  He is now asymptomatic.  He was informed of his abnormal liver enzymes.  He is not interested in any further testing at this time.  He was encouraged to follow-up outpatient for repeat lab work.  He was given contact information for GI follow-up.  He was discharged in stable condition. I ordered medication including IV fluids for hydration, magnesium  sulfate for hypomagnesemia, Reglan  for nausea, Librium  for possible alcohol  withdrawal, Keppra  for seizure prophylaxis Reevaluation of the patient after these medicines showed that the patient symptoms resolved I have reviewed the patients home medicines and have made adjustments as needed  Social Determinants of Health:  Lives independently      Final diagnoses:  Alcoholic hepatitis without ascites  Nausea vomiting and diarrhea    ED Discharge Orders          Ordered    metoCLOPramide  (REGLAN ) 10 MG tablet  Every 6 hours        08/23/23 2122               Melvenia Motto, MD 08/23/23 2144

## 2023-09-15 ENCOUNTER — Other Ambulatory Visit: Payer: Self-pay | Admitting: Neurology

## 2023-09-15 NOTE — Telephone Encounter (Signed)
 Last seen on 05/12/22 Follow up scheduled on 12/13/23

## 2023-10-02 ENCOUNTER — Other Ambulatory Visit: Payer: Self-pay | Admitting: Nurse Practitioner

## 2023-10-02 DIAGNOSIS — I10 Essential (primary) hypertension: Secondary | ICD-10-CM

## 2023-10-03 NOTE — Telephone Encounter (Signed)
 No pending appointment, please advise.

## 2023-10-22 ENCOUNTER — Other Ambulatory Visit: Payer: Self-pay | Admitting: Nurse Practitioner

## 2023-10-22 DIAGNOSIS — F411 Generalized anxiety disorder: Secondary | ICD-10-CM

## 2023-11-29 ENCOUNTER — Encounter (HOSPITAL_COMMUNITY): Payer: Self-pay

## 2023-11-29 ENCOUNTER — Emergency Department (HOSPITAL_COMMUNITY): Payer: MEDICAID

## 2023-11-29 ENCOUNTER — Other Ambulatory Visit: Payer: Self-pay

## 2023-11-29 ENCOUNTER — Inpatient Hospital Stay (HOSPITAL_COMMUNITY)
Admission: EM | Admit: 2023-11-29 | Discharge: 2023-12-02 | DRG: 433 | Disposition: A | Discharge status: Home / Self Care | Payer: MEDICAID | Attending: Internal Medicine | Admitting: Internal Medicine

## 2023-11-29 DIAGNOSIS — F10930 Alcohol use, unspecified with withdrawal, uncomplicated: Principal | ICD-10-CM

## 2023-11-29 DIAGNOSIS — I10 Essential (primary) hypertension: Secondary | ICD-10-CM | POA: Diagnosis present

## 2023-11-29 DIAGNOSIS — K760 Fatty (change of) liver, not elsewhere classified: Secondary | ICD-10-CM | POA: Diagnosis present

## 2023-11-29 DIAGNOSIS — F419 Anxiety disorder, unspecified: Secondary | ICD-10-CM | POA: Diagnosis present

## 2023-11-29 DIAGNOSIS — K7010 Alcoholic hepatitis without ascites: Principal | ICD-10-CM | POA: Diagnosis present

## 2023-11-29 DIAGNOSIS — Y908 Blood alcohol level of 240 mg/100 ml or more: Secondary | ICD-10-CM | POA: Diagnosis present

## 2023-11-29 DIAGNOSIS — I480 Paroxysmal atrial fibrillation: Secondary | ICD-10-CM | POA: Diagnosis present

## 2023-11-29 DIAGNOSIS — F329 Major depressive disorder, single episode, unspecified: Secondary | ICD-10-CM | POA: Diagnosis present

## 2023-11-29 DIAGNOSIS — E871 Hypo-osmolality and hyponatremia: Secondary | ICD-10-CM | POA: Diagnosis present

## 2023-11-29 DIAGNOSIS — K7040 Alcoholic hepatic failure without coma: Secondary | ICD-10-CM | POA: Diagnosis present

## 2023-11-29 DIAGNOSIS — Z7141 Alcohol abuse counseling and surveillance of alcoholic: Secondary | ICD-10-CM | POA: Diagnosis not present

## 2023-11-29 DIAGNOSIS — G40909 Epilepsy, unspecified, not intractable, without status epilepticus: Secondary | ICD-10-CM | POA: Diagnosis present

## 2023-11-29 DIAGNOSIS — Z79899 Other long term (current) drug therapy: Secondary | ICD-10-CM

## 2023-11-29 DIAGNOSIS — K219 Gastro-esophageal reflux disease without esophagitis: Secondary | ICD-10-CM | POA: Diagnosis present

## 2023-11-29 DIAGNOSIS — E860 Dehydration: Secondary | ICD-10-CM | POA: Diagnosis present

## 2023-11-29 DIAGNOSIS — F10230 Alcohol dependence with withdrawal, uncomplicated: Secondary | ICD-10-CM | POA: Diagnosis not present

## 2023-11-29 LAB — COMPREHENSIVE METABOLIC PANEL WITH GFR
ALT: 469 U/L — ABNORMAL HIGH (ref 0–44)
AST: 1860 U/L — ABNORMAL HIGH (ref 15–41)
Albumin: 3.5 g/dL (ref 3.5–5.0)
Alkaline Phosphatase: 141 U/L — ABNORMAL HIGH (ref 38–126)
Anion gap: 15 (ref 5–15)
BUN: 12 mg/dL (ref 6–20)
CO2: 22 mmol/L (ref 22–32)
Calcium: 8 mg/dL — ABNORMAL LOW (ref 8.9–10.3)
Chloride: 99 mmol/L (ref 98–111)
Creatinine, Ser: 0.99 mg/dL (ref 0.61–1.24)
GFR, Estimated: >60 mL/min (ref >60)
Glucose, Bld: 110 mg/dL — ABNORMAL HIGH (ref 70–99)
Potassium: 3.9 mmol/L (ref 3.5–5.1)
Sodium: 136 mmol/L (ref 135–145)
Total Bilirubin: 1.5 mg/dL — ABNORMAL HIGH (ref 0.0–1.2)
Total Protein: 7.3 g/dL (ref 6.5–8.1)

## 2023-11-29 LAB — CBC WITH DIFFERENTIAL/PLATELET
Abs Immature Granulocytes: 0.01 K/uL (ref 0.00–0.07)
Basophils Absolute: 0.1 K/uL (ref 0.0–0.1)
Basophils Relative: 3 %
Eosinophils Absolute: 0 K/uL (ref 0.0–0.5)
Eosinophils Relative: 0 %
HCT: 37.1 % — ABNORMAL LOW (ref 39.0–52.0)
Hemoglobin: 13.5 g/dL (ref 13.0–17.0)
Immature Granulocytes: 0 %
Lymphocytes Relative: 33 %
Lymphs Abs: 0.7 K/uL (ref 0.7–4.0)
MCH: 35.2 pg — ABNORMAL HIGH (ref 26.0–34.0)
MCHC: 36.4 g/dL — ABNORMAL HIGH (ref 30.0–36.0)
MCV: 96.6 fL (ref 80.0–100.0)
Monocytes Absolute: 0.3 K/uL (ref 0.1–1.0)
Monocytes Relative: 15 %
Neutro Abs: 1.1 K/uL — ABNORMAL LOW (ref 1.7–7.7)
Neutrophils Relative %: 49 %
Platelets: 160 K/uL (ref 150–400)
RBC: 3.84 MIL/uL — ABNORMAL LOW (ref 4.22–5.81)
RDW: 13.3 % (ref 11.5–15.5)
WBC: 2.2 K/uL — ABNORMAL LOW (ref 4.0–10.5)
nRBC: 0 % (ref 0.0–0.2)

## 2023-11-29 LAB — ETHANOL: Alcohol, Ethyl (B): 329 mg/dL (ref <15)

## 2023-11-29 LAB — MAGNESIUM: Magnesium: 1.6 mg/dL — ABNORMAL LOW (ref 1.7–2.4)

## 2023-11-29 LAB — CBC
HCT: 34.6 % — ABNORMAL LOW (ref 39.0–52.0)
Hemoglobin: 12.4 g/dL — ABNORMAL LOW (ref 13.0–17.0)
MCH: 34.8 pg — ABNORMAL HIGH (ref 26.0–34.0)
MCHC: 35.8 g/dL (ref 30.0–36.0)
MCV: 97.2 fL (ref 80.0–100.0)
Platelets: 150 K/uL (ref 150–400)
RBC: 3.56 MIL/uL — ABNORMAL LOW (ref 4.22–5.81)
RDW: 13.2 % (ref 11.5–15.5)
WBC: 2.6 K/uL — ABNORMAL LOW (ref 4.0–10.5)
nRBC: 0 % (ref 0.0–0.2)

## 2023-11-29 LAB — HEPATITIS PANEL, ACUTE
HCV Ab: NONREACTIVE
Hep A IgM: NONREACTIVE
Hep B C IgM: NONREACTIVE
Hepatitis B Surface Ag: NONREACTIVE

## 2023-11-29 LAB — CREATININE, SERUM
Creatinine, Ser: 0.93 mg/dL (ref 0.61–1.24)
GFR, Estimated: >60 mL/min (ref >60)

## 2023-11-29 LAB — PROTIME-INR
INR: 1.1 (ref 0.8–1.2)
Prothrombin Time: 14.9 s (ref 11.4–15.2)

## 2023-11-29 MED ORDER — LORAZEPAM 1 MG PO TABS
1.0000 mg | ORAL_TABLET | ORAL | Status: DC | PRN
  Administered 2023-11-29: 1 mg via ORAL
  Filled 2023-11-29: qty 1

## 2023-11-29 MED ORDER — MAGNESIUM SULFATE 2 GM/50ML IV SOLN
2.0000 g | Freq: Once | INTRAVENOUS | Status: AC
  Administered 2023-11-29: 2 g via INTRAVENOUS
  Filled 2023-11-29: qty 50

## 2023-11-29 MED ORDER — SODIUM CHLORIDE 0.9 % IV BOLUS
1000.0000 mL | Freq: Once | INTRAVENOUS | Status: AC
  Administered 2023-11-29: 1000 mL via INTRAVENOUS

## 2023-11-29 MED ORDER — SODIUM CHLORIDE 0.9 % IV SOLN: INTRAVENOUS | Status: AC

## 2023-11-29 MED ORDER — ONDANSETRON HCL 4 MG/2ML IJ SOLN: 4.0000 mg | Freq: Four times a day (QID) | INTRAMUSCULAR | Status: DC | PRN

## 2023-11-29 MED ORDER — LORAZEPAM 2 MG/ML IJ SOLN: 1.0000 mg | INTRAMUSCULAR | Status: DC | PRN

## 2023-11-29 MED ORDER — SENNOSIDES-DOCUSATE SODIUM 8.6-50 MG PO TABS: 1.0000 | ORAL_TABLET | Freq: Every evening | ORAL | Status: DC | PRN

## 2023-11-29 MED ORDER — MAGNESIUM SULFATE 2 GM/50ML IV SOLN
2.0000 g | Freq: Once | INTRAVENOUS | Status: AC
Start: 2023-11-29 — End: 2023-11-29
  Administered 2023-11-29: 2 g via INTRAVENOUS
  Filled 2023-11-29: qty 50

## 2023-11-29 MED ORDER — THIAMINE MONONITRATE 100 MG PO TABS
100.0000 mg | ORAL_TABLET | Freq: Every day | ORAL | Status: DC
  Administered 2023-11-29 – 2023-12-02 (×4): 100 mg via ORAL
  Filled 2023-11-29 (×4): qty 1

## 2023-11-29 MED ORDER — FOLIC ACID 1 MG PO TABS
1.0000 mg | ORAL_TABLET | Freq: Every day | ORAL | Status: DC
  Administered 2023-11-29 – 2023-12-02 (×4): 1 mg via ORAL
  Filled 2023-11-29 (×4): qty 1

## 2023-11-29 MED ORDER — PHENOBARBITAL SODIUM 130 MG/ML IJ SOLN
130.0000 mg | Freq: Once | INTRAMUSCULAR | Status: AC
Start: 2023-11-29 — End: 2023-11-29
  Administered 2023-11-29: 130 mg via INTRAVENOUS

## 2023-11-29 MED ORDER — ALBUTEROL SULFATE (2.5 MG/3ML) 0.083% IN NEBU: 2.5000 mg | INHALATION_SOLUTION | RESPIRATORY_TRACT | Status: DC | PRN

## 2023-11-29 MED ORDER — ACETAMINOPHEN 650 MG RE SUPP: 650.0000 mg | Freq: Four times a day (QID) | RECTAL | Status: DC | PRN

## 2023-11-29 MED ORDER — LEVETIRACETAM 750 MG PO TABS
750.0000 mg | ORAL_TABLET | Freq: Two times a day (BID) | ORAL | Status: DC
  Administered 2023-11-29 – 2023-12-02 (×7): 750 mg via ORAL
  Filled 2023-11-29 (×7): qty 1

## 2023-11-29 MED ORDER — SODIUM CHLORIDE 0.9 % IV SOLN
1.0000 mg | Freq: Once | INTRAVENOUS | Status: AC
  Administered 2023-11-29: 1 mg via INTRAVENOUS
  Filled 2023-11-29: qty 0.2

## 2023-11-29 MED ORDER — THIAMINE HCL 100 MG/ML IJ SOLN
100.0000 mg | Freq: Once | INTRAMUSCULAR | Status: AC
Start: 2023-11-29 — End: 2023-11-29
  Administered 2023-11-29: 100 mg via INTRAVENOUS
  Filled 2023-11-29: qty 2

## 2023-11-29 MED ORDER — ENOXAPARIN SODIUM 40 MG/0.4ML IJ SOSY
40.0000 mg | PREFILLED_SYRINGE | INTRAMUSCULAR | Status: DC
  Administered 2023-11-29 – 2023-12-01 (×3): 40 mg via SUBCUTANEOUS
  Filled 2023-11-29 (×3): qty 0.4

## 2023-11-29 MED ORDER — BISACODYL 5 MG PO TBEC: 5.0000 mg | DELAYED_RELEASE_TABLET | Freq: Every day | ORAL | Status: DC | PRN

## 2023-11-29 MED ORDER — PHENOBARBITAL SODIUM 130 MG/ML IJ SOLN
130.0000 mg | Freq: Once | INTRAMUSCULAR | Status: DC
  Filled 2023-11-29: qty 1

## 2023-11-29 MED ORDER — ADULT MULTIVITAMIN W/MINERALS CH
1.0000 | ORAL_TABLET | Freq: Every day | ORAL | Status: DC
  Administered 2023-11-29 – 2023-12-02 (×4): 1 via ORAL
  Filled 2023-11-29 (×4): qty 1

## 2023-11-29 MED ORDER — PHENOBARBITAL SODIUM 130 MG/ML IJ SOLN
130.0000 mg | Freq: Once | INTRAMUSCULAR | Status: AC
  Administered 2023-11-29: 130 mg via INTRAVENOUS
  Filled 2023-11-29: qty 1

## 2023-11-29 MED ORDER — HYDRALAZINE HCL 20 MG/ML IJ SOLN
10.0000 mg | Freq: Four times a day (QID) | INTRAMUSCULAR | Status: DC | PRN
  Administered 2023-11-30: 10 mg via INTRAVENOUS
  Filled 2023-11-29: qty 1

## 2023-11-29 MED ORDER — METOPROLOL SUCCINATE ER 25 MG PO TB24
50.0000 mg | ORAL_TABLET | Freq: Every day | ORAL | Status: DC
  Administered 2023-11-29 – 2023-12-02 (×4): 50 mg via ORAL
  Filled 2023-11-29 (×4): qty 2

## 2023-11-29 MED ORDER — METHOCARBAMOL 1000 MG/10ML IJ SOLN
500.0000 mg | Freq: Four times a day (QID) | INTRAMUSCULAR | Status: DC | PRN
  Administered 2023-11-30: 500 mg via INTRAVENOUS
  Filled 2023-11-29: qty 10

## 2023-11-29 MED ORDER — ACETAMINOPHEN 325 MG PO TABS: 650.0000 mg | ORAL_TABLET | Freq: Four times a day (QID) | ORAL | Status: DC | PRN

## 2023-11-29 MED ORDER — ONDANSETRON HCL 4 MG PO TABS: 4.0000 mg | ORAL_TABLET | Freq: Four times a day (QID) | ORAL | Status: DC | PRN

## 2023-11-29 MED ORDER — ESCITALOPRAM OXALATE 10 MG PO TABS
10.0000 mg | ORAL_TABLET | Freq: Every day | ORAL | Status: DC
  Administered 2023-11-30 – 2023-12-02 (×3): 10 mg via ORAL
  Filled 2023-11-29 (×3): qty 1
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
## 2023-11-29 NOTE — ED Triage Notes (Signed)
 PT brought in by [facility], PT states he is here for alcohol  withdraw, PT HR was elevated to 118. PT states he wants help to stop drinking and states his last drink was around 4am beer. PT was able to walk to bed undewr own power.
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
## 2023-11-29 NOTE — ED Notes (Signed)
 Report received from Tan, [place]

## 2023-11-29 NOTE — Progress Notes (Signed)
 Patient arrived to the unit, able to transfer self from stretcher to bed. Pain is 1/10 right now and no other concerns noted at this time. Patient educated on home meds protocol.

## 2023-11-29 NOTE — ED Notes (Signed)
 CCMD called.

## 2023-11-30 DIAGNOSIS — K7040 Alcoholic hepatic failure without coma: Secondary | ICD-10-CM | POA: Diagnosis not present

## 2023-11-30 LAB — CBC
HCT: 31.7 % — ABNORMAL LOW (ref 39.0–52.0)
Hemoglobin: 11.5 g/dL — ABNORMAL LOW (ref 13.0–17.0)
MCH: 35.3 pg — ABNORMAL HIGH (ref 26.0–34.0)
MCHC: 36.3 g/dL — ABNORMAL HIGH (ref 30.0–36.0)
MCV: 97.2 fL (ref 80.0–100.0)
Platelets: 128 K/uL — ABNORMAL LOW (ref 150–400)
RBC: 3.26 MIL/uL — ABNORMAL LOW (ref 4.22–5.81)
RDW: 13.4 % (ref 11.5–15.5)
WBC: 2.7 K/uL — ABNORMAL LOW (ref 4.0–10.5)
nRBC: 0 % (ref 0.0–0.2)

## 2023-11-30 LAB — COMPREHENSIVE METABOLIC PANEL WITH GFR
ALT: 583 U/L — ABNORMAL HIGH (ref 0–44)
AST: 2724 U/L — ABNORMAL HIGH (ref 15–41)
Albumin: 3.1 g/dL — ABNORMAL LOW (ref 3.5–5.0)
Alkaline Phosphatase: 159 U/L — ABNORMAL HIGH (ref 38–126)
Anion gap: 12 (ref 5–15)
BUN: 7 mg/dL (ref 6–20)
CO2: 20 mmol/L — ABNORMAL LOW (ref 22–32)
Calcium: 8 mg/dL — ABNORMAL LOW (ref 8.9–10.3)
Chloride: 97 mmol/L — ABNORMAL LOW (ref 98–111)
Creatinine, Ser: 0.85 mg/dL (ref 0.61–1.24)
GFR, Estimated: >60 mL/min (ref >60)
Glucose, Bld: 86 mg/dL (ref 70–99)
Potassium: 3.7 mmol/L (ref 3.5–5.1)
Sodium: 129 mmol/L — ABNORMAL LOW (ref 135–145)
Total Bilirubin: 2.7 mg/dL — ABNORMAL HIGH (ref 0.0–1.2)
Total Protein: 6.5 g/dL (ref 6.5–8.1)

## 2023-11-30 MED ORDER — LORAZEPAM 2 MG/ML IJ SOLN: 0.0000 mg | Freq: Two times a day (BID) | INTRAMUSCULAR | Status: DC

## 2023-11-30 MED ORDER — SODIUM CHLORIDE 0.9 % IV SOLN: INTRAVENOUS | Status: DC

## 2023-11-30 MED ORDER — LORAZEPAM 1 MG PO TABS
1.0000 mg | ORAL_TABLET | ORAL | Status: DC | PRN
  Administered 2023-11-30 – 2023-12-01 (×2): 3 mg via ORAL
  Filled 2023-11-30 (×2): qty 3

## 2023-11-30 MED ORDER — MAGNESIUM SULFATE 2 GM/50ML IV SOLN
2.0000 g | Freq: Once | INTRAVENOUS | Status: AC
  Administered 2023-11-30: 2 g via INTRAVENOUS
  Filled 2023-11-30: qty 50

## 2023-11-30 MED ORDER — AMLODIPINE BESYLATE 10 MG PO TABS
10.0000 mg | ORAL_TABLET | Freq: Every day | ORAL | Status: DC
  Administered 2023-11-30 – 2023-12-02 (×3): 10 mg via ORAL
  Filled 2023-11-30 (×3): qty 1

## 2023-11-30 MED ORDER — LORAZEPAM 2 MG/ML IJ SOLN: 1.0000 mg | INTRAMUSCULAR | Status: DC | PRN

## 2023-11-30 MED ORDER — LORAZEPAM 2 MG/ML IJ SOLN: 0.0000 mg | Freq: Four times a day (QID) | INTRAMUSCULAR | Status: DC

## 2023-11-30 NOTE — Progress Notes (Signed)
 PROGRESS NOTE    Devon Hanson  FMW:[id number] DOB: 05/30/1980 DOA: 11/29/2023 PCP: Caro Harlene POUR, NP    Brief Narrative:  Devon Hanson is a 43 y.o. male with medical history significant of comorbidities including anxiety/depression, essential hypertension, alcohol  abuse, seizures, who presented to the emergency room with chief complaint of right upper quadrant abdominal pain, nausea, vomiting and not feeling well.  As per patient, he drinks 5-6 beers every day and has been drinking for many years now.  He also told me that he has been admitted multiple times for alcohol  withdrawal symptoms. His last drink was at 4 AM. For his hypertension, he takes metoprolol  succinate 50 mg daily. He is also supposed to take amlodipine  but he is not because of his insurance issues.  Assessment and Plan: Acute alcoholic hepatitis,POA: As evidenced by highly elevated AST compared to ALT, RUQ tenderness and heavy alcohol  use Continue with IVF F/u ULT liver- Gallbladder sludge without cholelithiasis/cholecystitis. He has hepatic steatosis. -Trend LFTs MDF is less than 32 so no need for steroid initiation.   Alcohol  abuse with high risk for alcohol  withdrawal symptoms:  Drinks 5-6 beers per day Placed on CIWA protocol-add scheduled as well as as needed Continue with thiamine  and folate supplementation. TOC consulted Counseled extensively regarding alcohol  cessation   Seizure disorder: continue with home dose of Keppra  750 mg   Hypertension:  Continue with metoprolol  succinate 50 mg daily   Dehydration:  -Secondary to emesis. Continue with maintenance fluids.   Hypomagnesemia:  -replete   Anxiety/major depression:  -Continue with escitalopram .  -No suicidal or homicidal ideations   Hyponatremia - Recheck in the a.m.   DVT prophylaxis: enoxaparin  (LOVENOX ) injection 40 mg Start: 11/29/23 1200    Code Status: Full Code  Disposition Plan:  Level of care: Telemetry  Medical Status is: Inpatient     Consultants:  None  Subjective: Feels like he is having some withdrawal symptoms  Objective: Vitals:   11/29/23 2023 11/29/23 2159 11/30/23 0557 11/30/23 0902  BP: (!) 147/107 (!) 150/102 (!) 150/98 (!) 151/95  Pulse: 84  79 80  Resp: 17     Temp: 99.5 F (37.5 C)  99 F (37.2 C) 99.2 F (37.3 C)  TempSrc: Oral  Oral Oral  SpO2: 95%  98% 99%  Weight:      Height:        Intake/Output Summary (Last 24 hours) at 11/30/2023 1224 Last data filed at 11/30/2023 0900 Gross per 24 hour  Intake 1647.08 ml  Output --  Net 1647.08 ml   Filed Weights   11/29/23 0655  Weight: 79.4 kg    Examination:   General: Appearance:     Overweight male in no acute distress     Lungs:     respirations unlabored  Heart:    Normal heart rate.     MS:   All extremities are intact.    Neurologic:   Awake, alert       Data Reviewed: I have personally reviewed following labs and imaging studies  CBC: Recent Labs  Lab 11/29/23 0838 11/29/23 2027 11/30/23 0459  WBC 2.2* 2.6* 2.7*  NEUTROABS 1.1*  --   --   HGB 13.5 12.4* 11.5*  HCT 37.1* 34.6* 31.7*  MCV 96.6 97.2 97.2  PLT 160 150 128*   Basic Metabolic Panel: Recent Labs  Lab 11/29/23 0838 11/29/23 2027 11/30/23 0459  NA 136  --  129*  K 3.9  --  3.7  CL 99  --  97*  CO2 22  --  20*  GLUCOSE 110*  --  86  BUN 12  --  7  CREATININE 0.99 0.93 0.85  CALCIUM  8.0*  --  8.0*  MG 1.6*  --   --    GFR: Estimated Creatinine Clearance: 110.6 mL/min (by C-G formula based on SCr of 0.85 mg/dL). Liver Function Tests: Recent Labs  Lab 11/29/23 0838 11/30/23 0459  AST 1,860* 2,724*  ALT 469* 583*  ALKPHOS 141* 159*  BILITOT 1.5* 2.7*  PROT 7.3 6.5  ALBUMIN 3.5 3.1*   No results for input(s): LIPASE, AMYLASE in the last 168 hours. No results for input(s): AMMONIA in the last 168 hours. Coagulation Profile: Recent Labs  Lab 11/29/23 0838  INR 1.1   Cardiac Enzymes: No  results for input(s): CKTOTAL, CKMB, CKMBINDEX, TROPONINI in the last 168 hours. BNP (last 3 results) No results for input(s): PROBNP in the last 8760 hours. HbA1C: No results for input(s): HGBA1C in the last 72 hours. CBG: No results for input(s): GLUCAP in the last 168 hours. Lipid Profile: No results for input(s): CHOL, HDL, LDLCALC, TRIG, CHOLHDL, LDLDIRECT in the last 72 hours. Thyroid  Function Tests: No results for input(s): TSH, T4TOTAL, FREET4, T3FREE, THYROIDAB in the last 72 hours. Anemia Panel: No results for input(s): VITAMINB12, FOLATE, FERRITIN, TIBC, IRON, RETICCTPCT in the last 72 hours. Sepsis Labs: No results for input(s): PROCALCITON, LATICACIDVEN in the last 168 hours.  No results found for this or any previous visit (from the past 240 hours).       Radiology Studies: US  Abdomen Limited RUQ (LIVER/GB) Result Date: 11/29/2023 CLINICAL DATA:  Right upper quadrant pain. EXAM: ULTRASOUND ABDOMEN LIMITED RIGHT UPPER QUADRANT COMPARISON:  December 16, 2022 FINDINGS: Gallbladder: A mild amount of echogenic sludge is seen within the dependent portion of the gallbladder lumen. No gallstones or wall thickening visualized (1.5 mm). No sonographic Murphy sign noted by sonographer. Common bile duct: Diameter: 3.8 mm Liver: No focal lesion identified. The liver parenchyma is coarse in echotexture and increased in parenchymal echogenicity. Portal vein is patent on color Doppler imaging with normal direction of blood flow towards the liver. Other: None. IMPRESSION: 1. Gallbladder sludge without evidence of cholelithiasis or acute cholecystitis. 2. Hepatic steatosis. Electronically Signed   By: Suzen Dials M.D.   On: 11/29/2023 12:15        Scheduled Meds:  enoxaparin  (LOVENOX ) injection  40 mg Subcutaneous Q24H   escitalopram   10 mg Oral Daily   folic acid   1 mg Oral Daily   levETIRAcetam   750 mg Oral BID   LORazepam    0-4 mg Intravenous Q6H   Followed by   NOREEN ON 12/02/2023] LORazepam   0-4 mg Intravenous Q12H   metoprolol  succinate  50 mg Oral Daily   multivitamin with minerals  1 tablet Oral Daily   thiamine   100 mg Oral Daily   Continuous Infusions:   LOS: 1 day    Time spent: 45 minutes spent on chart review, discussion with nursing staff, consultants, updating family and interview/physical exam; more than 50% of that time was spent in counseling and/or coordination of care.    Harlene RAYMOND Bowl, DO [facility] Available via Epic secure chat 7am-7pm After these hours, please refer to coverage provider listed on [email or web address] 11/30/2023, 12:24 PM

## 2023-11-30 NOTE — Plan of Care (Signed)
 ?  Problem: Education: ?Goal: Knowledge of General Education information will improve ?Description: Including pain rating scale, medication(s)/side effects and non-pharmacologic comfort measures ?Outcome: Progressing ?  ?Problem: Health Behavior/Discharge Planning: ?Goal: Ability to manage health-related needs will improve ?Outcome: Progressing ?  ?Problem: Coping: ?Goal: Level of anxiety will decrease ?Outcome: Progressing ?  ?

## 2023-11-30 NOTE — TOC Initial Note (Signed)
 Transition of Care [facility]) - Initial/Assessment Note    Patient Details  Name: Devon Hanson MRN: [id number] Date of Birth: 10-04-1980  Transition of [facility]) CM/SW Contact:    Jeoffrey LITTIE Moose, [place] Phone Number: 11/30/2023, 12:41 PM  Clinical Narrative:                 Pt admitted from home due to alcohol  withdrawal. SW completed ETOH consult with pt. Pt stated he has a PCP, has access to transportation and lives with his aunt. Pt also stated he drinks alcohol  regularly and accepted the resource packet CSW offered.        Patient Goals and CMS Choice            Expected Discharge Plan and Services                                              Prior Living Arrangements/Services                       Activities of Daily Living   ADL Screening (condition at time of admission) Independently performs ADLs?: Yes (appropriate for developmental age) Is the patient deaf or have difficulty hearing?: No Does the patient have difficulty seeing, even when wearing glasses/contacts?: No Does the patient have difficulty concentrating, remembering, or making decisions?: No  Permission Sought/Granted                  Emotional Assessment              Admission diagnosis:  Alcoholic hepatitis without ascites (HCC) [K70.10] 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) [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.40] 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) [F10.930] Patient Active Problem List   Diagnosis Date Noted   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) 11/29/2023   Electrolyte abnormality 04/05/2023   Hypomagnesemia 04/04/2023   Hypokalemia due to excessive gastrointestinal loss of potassium 04/04/2023   Acute alcoholic hepatitis (HCC) 12/16/2022   Anxiety and depression 12/16/2022   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   Thrombocytopenia 10/29/2022   History of atrial fibrillation 10/29/2022   Nausea vomiting and diarrhea 09/06/2022   Elevated lipase  09/06/2022   Paroxysmal atrial fibrillation (HCC) 09/06/2022   Transaminitis 02/06/2022   GERD (gastroesophageal reflux disease) 10/22/2021   Essential hypertension 10/22/2021   Hypokalemia 10/22/2021   Alcohol  use disorder    Lumbar compression fracture, closed, initial encounter [facility])    Thoracic compression fracture, closed, initial encounter (HCC)    Seizure due to alcohol  withdrawal (HCC) 10/21/2021   Seizure (HCC) 10/21/2021   GAD (generalized anxiety disorder) 10/20/2021   Shoulder joint instability, left    Type 2 superior labral anterior-to-posterior (SLAP) tear of shoulder, subsequent encounter    PCP:  Caro Harlene POUR, NP Pharmacy:   [id number] [address], [place] - [address] AT [facility] [address] [place] [address] Phone: [phone] Fax: [phone]  [facility] [address] [place] [address] Phone: [phone] Fax: [phone]  [place] [facility] [address], [address] [place] [address] Phone: [phone] Fax: [phone]     Social Drivers of Health (SDOH) Social History: SDOH Screenings   Food Insecurity: No Food Insecurity (11/29/2023)  Housing: Low Risk  (11/29/2023)  Transportation Needs: No Transportation Needs (11/29/2023)  Utilities:  Not At Risk (11/29/2023)  Depression (PHQ2-9): Low Risk  (04/19/2022)  Social Connections: Unknown (06/17/2021)   Received from [facility]  Tobacco Use: Low Risk  (11/29/2023)   SDOH Interventions:     Readmission Risk Interventions    02/08/2022    2:19 PM  Readmission Risk Prevention Plan  Transportation Screening Complete  PCP or Specialist Appt within 5-7 Days Complete  Home Care Screening Complete  Medication Review (RN CM) Complete

## 2023-11-30 NOTE — Progress Notes (Signed)
 PRN dose of hydralazine given d/t BP charted below at 1728. Recheck BP at 1841 156/101. Pt has mild headache but no other symptoms. Notified Dr. Harlene Bowl for further recommendations. Patient home BP medication, amlodipine  resumed.   11/30/23 1728  Vitals  BP (!) 165/102  MEWS COLOR  MEWS Score Color Green  MEWS Score  MEWS Temp 0  MEWS Systolic 0  MEWS Pulse 0  MEWS RR 0  MEWS LOC 0  MEWS Score 0

## 2023-11-30 NOTE — Plan of Care (Signed)
   Problem: Coping: Goal: Level of anxiety will decrease Outcome: Progressing   Problem: Pain Managment: Goal: General experience of comfort will improve and/or be controlled Outcome: Progressing   Problem: Safety: Goal: Ability to remain free from injury will improve Outcome: Progressing

## 2023-12-01 LAB — COMPREHENSIVE METABOLIC PANEL WITH GFR
ALT: 578 U/L — ABNORMAL HIGH (ref 0–44)
AST: 1811 U/L — ABNORMAL HIGH (ref 15–41)
Albumin: 3.2 g/dL — ABNORMAL LOW (ref 3.5–5.0)
Alkaline Phosphatase: 191 U/L — ABNORMAL HIGH (ref 38–126)
Anion gap: 12 (ref 5–15)
BUN: <5 mg/dL — ABNORMAL LOW (ref 6–20)
CO2: 20 mmol/L — ABNORMAL LOW (ref 22–32)
Calcium: 8 mg/dL — ABNORMAL LOW (ref 8.9–10.3)
Chloride: 96 mmol/L — ABNORMAL LOW (ref 98–111)
Creatinine, Ser: 0.76 mg/dL (ref 0.61–1.24)
GFR, Estimated: >60 mL/min (ref >60)
Glucose, Bld: 98 mg/dL (ref 70–99)
Potassium: 2.9 mmol/L — ABNORMAL LOW (ref 3.5–5.1)
Sodium: 128 mmol/L — ABNORMAL LOW (ref 135–145)
Total Bilirubin: 1.9 mg/dL — ABNORMAL HIGH (ref 0.0–1.2)
Total Protein: UNDETERMINED g/dL (ref 6.5–8.1)

## 2023-12-01 LAB — BASIC METABOLIC PANEL WITH GFR
Anion gap: 12 (ref 5–15)
BUN: <5 mg/dL — ABNORMAL LOW (ref 6–20)
CO2: 19 mmol/L — ABNORMAL LOW (ref 22–32)
Calcium: 8.4 mg/dL — ABNORMAL LOW (ref 8.9–10.3)
Chloride: 103 mmol/L (ref 98–111)
Creatinine, Ser: 1.01 mg/dL (ref 0.61–1.24)
GFR, Estimated: >60 mL/min (ref >60)
Glucose, Bld: 117 mg/dL — ABNORMAL HIGH (ref 70–99)
Potassium: 3.7 mmol/L (ref 3.5–5.1)
Sodium: 134 mmol/L — ABNORMAL LOW (ref 135–145)

## 2023-12-01 LAB — CBC
HCT: 33.2 % — ABNORMAL LOW (ref 39.0–52.0)
Hemoglobin: 12.4 g/dL — ABNORMAL LOW (ref 13.0–17.0)
MCH: 36.2 pg — ABNORMAL HIGH (ref 26.0–34.0)
MCHC: 37.3 g/dL — ABNORMAL HIGH (ref 30.0–36.0)
MCV: 96.8 fL (ref 80.0–100.0)
Platelets: 118 K/uL — ABNORMAL LOW (ref 150–400)
RBC: 3.43 MIL/uL — ABNORMAL LOW (ref 4.22–5.81)
RDW: 13.2 % (ref 11.5–15.5)
WBC: 2.8 K/uL — ABNORMAL LOW (ref 4.0–10.5)
nRBC: 0 % (ref 0.0–0.2)

## 2023-12-01 LAB — SODIUM, URINE, RANDOM: Sodium, Ur: 84 mmol/L

## 2023-12-01 LAB — MAGNESIUM: Magnesium: 1.7 mg/dL (ref 1.7–2.4)

## 2023-12-01 LAB — OSMOLALITY, URINE: Osmolality, Ur: 222 mosm/kg — ABNORMAL LOW (ref 300–900)

## 2023-12-01 LAB — PHOSPHORUS: Phosphorus: 2.2 mg/dL — ABNORMAL LOW (ref 2.5–4.6)

## 2023-12-01 MED ORDER — MAGNESIUM SULFATE 2 GM/50ML IV SOLN
2.0000 g | Freq: Once | INTRAVENOUS | Status: AC
  Administered 2023-12-01: 2 g via INTRAVENOUS
  Filled 2023-12-01: qty 50

## 2023-12-01 MED ORDER — POTASSIUM CHLORIDE CRYS ER 10 MEQ PO TBCR
30.0000 meq | EXTENDED_RELEASE_TABLET | ORAL | Status: AC
  Administered 2023-12-01 (×2): 30 meq via ORAL
  Filled 2023-12-01 (×2): qty 3

## 2023-12-01 MED ORDER — POTASSIUM PHOSPHATES 15 MMOLE/5ML IV SOLN
30.0000 mmol | Freq: Once | INTRAVENOUS | Status: AC
  Administered 2023-12-01: 30 mmol via INTRAVENOUS
  Filled 2023-12-01: qty 10

## 2023-12-01 NOTE — Plan of Care (Signed)
  Problem: Coping: Goal: Level of anxiety will decrease Outcome: Progressing   Problem: Nutrition: Goal: Adequate nutrition will be maintained Outcome: Progressing   Problem: Safety: Goal: Ability to remain free from injury will improve Outcome: Progressing   

## 2023-12-01 NOTE — Progress Notes (Signed)
 PROGRESS NOTE    Devon Hanson  FMW:[id number] DOB: 10/08/1980 DOA: 11/29/2023 PCP: Caro Harlene POUR, NP    Brief Narrative:  Devon Hanson is a 43 y.o. male with medical history significant of comorbidities including anxiety/depression, essential hypertension, alcohol  abuse, seizures, who presented to the emergency room with chief complaint of right upper quadrant abdominal pain, nausea, vomiting and not feeling well.  As per patient, he drinks 5-6 beers every day and has been drinking for many years now.  He also told me that he has been admitted multiple times for alcohol  withdrawal symptoms. His last drink was at 4 AM. For his hypertension, he takes metoprolol  succinate 50 mg daily. He is also supposed to take amlodipine  but he is not because of his insurance issues.  Assessment and Plan: Acute alcoholic hepatitis,POA: As evidenced by highly elevated AST compared to ALT, RUQ tenderness and heavy alcohol  use Continue with IVF F/u ULT liver- Gallbladder sludge without cholelithiasis/cholecystitis. He has hepatic steatosis. - LFTs finally trending down MDF is less than 32 so no need for steroid initiation.   Alcohol  abuse with high risk for alcohol  withdrawal symptoms:  Drinks 5-6 beers per day Placed on CIWA protocol-add scheduled as well as as needed Continue with thiamine  and folate supplementation. TOC consulted Counseled extensively regarding alcohol  cessation   Seizure disorder: continue with home dose of Keppra  750 mg   Hypertension:  Continue with metoprolol  succinate 50 mg daily -Add Norvasc    Dehydration:  -Resolved   Hypomagnesemia:  -replete   Anxiety/major depression:  -Continue with escitalopram .  -No suicidal or homicidal ideations   Hyponatremia - Check urine sodium as well as urine osmole's - Appears patient to be drinking a lot of water so could be from this.   DVT prophylaxis: enoxaparin  (LOVENOX ) injection 40 mg Start: 11/29/23 1200     Code Status: Full Code  Disposition Plan:  Level of care: Telemetry Medical Status is: Inpatient     Consultants:  None  Subjective: Has been drinking a lot of water  Objective: Vitals:   11/30/23 1841 11/30/23 2020 12/01/23 0543 12/01/23 0938  BP: (!) 156/101 (!) 161/107 (!) 131/102 (!) 131/100  Pulse:  94 92 94  Resp:    17  Temp:  99.7 F (37.6 C) 98.6 F (37 C) 98.4 F (36.9 C)  TempSrc:  Oral Oral   SpO2:  99% 100% 100%  Weight:      Height:        Intake/Output Summary (Last 24 hours) at 12/01/2023 1207 Last data filed at 12/01/2023 0900 Gross per 24 hour  Intake 3460.11 ml  Output --  Net 3460.11 ml   Filed Weights   11/29/23 0655  Weight: 79.4 kg    Examination:   General: Appearance:     Overweight male in no acute distress     Lungs:     respirations unlabored  Heart:    Normal heart rate.     MS:   All extremities are intact.    Neurologic:   Awake, alert       Data Reviewed: I have personally reviewed following labs and imaging studies  CBC: Recent Labs  Lab 11/29/23 0838 11/29/23 2027 11/30/23 0459 12/01/23 0225  WBC 2.2* 2.6* 2.7* 2.8*  NEUTROABS 1.1*  --   --   --   HGB 13.5 12.4* 11.5* 12.4*  HCT 37.1* 34.6* 31.7* 33.2*  MCV 96.6 97.2 97.2 96.8  PLT 160 150 128* 118*   Basic Metabolic Panel:  Recent Labs  Lab 11/29/23 0838 11/29/23 2027 11/30/23 0459 12/01/23 0225  NA 136  --  129* 128*  K 3.9  --  3.7 2.9*  CL 99  --  97* 96*  CO2 22  --  20* 20*  GLUCOSE 110*  --  86 98  BUN 12  --  7 <5*  CREATININE 0.99 0.93 0.85 0.76  CALCIUM  8.0*  --  8.0* 8.0*  MG 1.6*  --   --  1.7  PHOS  --   --   --  2.2*   GFR: Estimated Creatinine Clearance: 117.6 mL/min (by C-G formula based on SCr of 0.76 mg/dL). Liver Function Tests: Recent Labs  Lab 11/29/23 0838 11/30/23 0459 12/01/23 0225  AST 1,860* 2,724* 1,811*  ALT 469* 583* 578*  ALKPHOS 141* 159* 191*  BILITOT 1.5* 2.7* 1.9*  PROT 7.3 6.5 UNABLE TO REPORT DUE  TO LIPEMIC INTERFERENCE  ALBUMIN 3.5 3.1* 3.2*   No results for input(s): LIPASE, AMYLASE in the last 168 hours. No results for input(s): AMMONIA in the last 168 hours. Coagulation Profile: Recent Labs  Lab 11/29/23 0838  INR 1.1   Cardiac Enzymes: No results for input(s): CKTOTAL, CKMB, CKMBINDEX, TROPONINI in the last 168 hours. BNP (last 3 results) No results for input(s): PROBNP in the last 8760 hours. HbA1C: No results for input(s): HGBA1C in the last 72 hours. CBG: No results for input(s): GLUCAP in the last 168 hours. Lipid Profile: No results for input(s): CHOL, HDL, LDLCALC, TRIG, CHOLHDL, LDLDIRECT in the last 72 hours. Thyroid  Function Tests: No results for input(s): TSH, T4TOTAL, FREET4, T3FREE, THYROIDAB in the last 72 hours. Anemia Panel: No results for input(s): VITAMINB12, FOLATE, FERRITIN, TIBC, IRON, RETICCTPCT in the last 72 hours. Sepsis Labs: No results for input(s): PROCALCITON, LATICACIDVEN in the last 168 hours.  No results found for this or any previous visit (from the past 240 hours).       Radiology Studies: No results found.       Scheduled Meds:  amLODipine   10 mg Oral Daily   enoxaparin  (LOVENOX ) injection  40 mg Subcutaneous Q24H   escitalopram   10 mg Oral Daily   folic acid   1 mg Oral Daily   levETIRAcetam   750 mg Oral BID   LORazepam   0-4 mg Intravenous Q6H   Followed by   NOREEN ON 12/02/2023] LORazepam   0-4 mg Intravenous Q12H   metoprolol  succinate  50 mg Oral Daily   multivitamin with minerals  1 tablet Oral Daily   thiamine   100 mg Oral Daily   Continuous Infusions:  magnesium  sulfate bolus IVPB 2 g (12/01/23 1130)     LOS: 2 days    Time spent: 45 minutes spent on chart review, discussion with nursing staff, consultants, updating family and interview/physical exam; more than 50% of that time was spent in counseling and/or coordination of care.    Harlene RAYMOND Bowl, DO [facility] Available via Epic secure chat 7am-7pm After these hours, please refer to coverage provider listed on [email or web address] 12/01/2023, 12:07 PM

## 2023-12-01 NOTE — Progress Notes (Signed)
 VAT consulted for PIV insertion.  Arrived to room pt stated current PIV was not working. Pt RFA PIV assessed and found to be occluded.   Dressing changed, flushed, patent, blood return noted  Consult complete

## 2023-12-02 ENCOUNTER — Other Ambulatory Visit (HOSPITAL_COMMUNITY): Payer: Self-pay

## 2023-12-02 LAB — CBC
HCT: 34.3 % — ABNORMAL LOW (ref 39.0–52.0)
Hemoglobin: 12.2 g/dL — ABNORMAL LOW (ref 13.0–17.0)
MCH: 35.2 pg — ABNORMAL HIGH (ref 26.0–34.0)
MCHC: 35.6 g/dL (ref 30.0–36.0)
MCV: 98.8 fL (ref 80.0–100.0)
Platelets: 123 K/uL — ABNORMAL LOW (ref 150–400)
RBC: 3.47 MIL/uL — ABNORMAL LOW (ref 4.22–5.81)
RDW: 13.5 % (ref 11.5–15.5)
WBC: 4.4 K/uL (ref 4.0–10.5)
nRBC: 0 % (ref 0.0–0.2)

## 2023-12-02 LAB — COMPREHENSIVE METABOLIC PANEL WITH GFR
ALT: 384 U/L — ABNORMAL HIGH (ref 0–44)
AST: 534 U/L — ABNORMAL HIGH (ref 15–41)
Albumin: 3.2 g/dL — ABNORMAL LOW (ref 3.5–5.0)
Alkaline Phosphatase: 181 U/L — ABNORMAL HIGH (ref 38–126)
Anion gap: 10 (ref 5–15)
BUN: 5 mg/dL — ABNORMAL LOW (ref 6–20)
CO2: 21 mmol/L — ABNORMAL LOW (ref 22–32)
Calcium: 8.5 mg/dL — ABNORMAL LOW (ref 8.9–10.3)
Chloride: 100 mmol/L (ref 98–111)
Creatinine, Ser: 0.81 mg/dL (ref 0.61–1.24)
GFR, Estimated: >60 mL/min (ref >60)
Glucose, Bld: 100 mg/dL — ABNORMAL HIGH (ref 70–99)
Potassium: 3.5 mmol/L (ref 3.5–5.1)
Sodium: 131 mmol/L — ABNORMAL LOW (ref 135–145)
Total Bilirubin: 1.2 mg/dL (ref 0.0–1.2)
Total Protein: 6.9 g/dL (ref 6.5–8.1)

## 2023-12-02 MED ORDER — ESCITALOPRAM OXALATE 10 MG PO TABS
10.0000 mg | ORAL_TABLET | Freq: Every day | ORAL | 0 refills | Status: DC
  Filled 2023-12-02: qty 30, 30d supply, fill #0

## 2023-12-02 MED ORDER — AMLODIPINE BESYLATE 10 MG PO TABS
10.0000 mg | ORAL_TABLET | Freq: Every day | ORAL | 1 refills | Status: AC
  Filled 2023-12-02: qty 90, 90d supply, fill #0

## 2023-12-02 NOTE — Plan of Care (Signed)

## 2023-12-02 NOTE — Discharge Summary (Signed)
 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  Diet recommendation:  Regular.  Wound care: None.  Code status: Full.   History of Present Illness:   Devon Hanson is a 43 y.o. male with medical history significant of comorbidities including anxiety/depression, essential hypertension, alcohol  abuse, seizures, who presented to the emergency room with chief complaint of right upper quadrant abdominal pain, nausea, vomiting and not feeling well.  As per patient, he drinks 5-6 beers every day and has been drinking for many years now.  He also told me that he has been admitted multiple times for alcohol  withdrawal symptoms. His last drink was at 4 AM. For his hypertension, he takes metoprolol  succinate 50 mg daily. He is also supposed to take amlodipine  but he is not because of his insurance issues. He also takes potassium and magnesium  supplements. He has three children and works at a [employer] company.   In ED, lab work showed elevated AST and ALT, pattern suggestive of alcoholic liver disease. He also received phenobarbital. He was having some tremors.      Hospital Course by Problem:   Acute alcoholic hepatitis,POA: As evidenced by highly elevated AST compared to ALT, RUQ tenderness and heavy alcohol  use Continue with IVF F/u ULT liver- Gallbladder sludge without cholelithiasis/cholecystitis. He has hepatic steatosis. - LFTs finally trending down MDF is less than 32 so no need for steroid initiation.   Alcohol  abuse with high risk for alcohol  withdrawal symptoms:  Drinks  5-6 beers per day Placed on CIWA protocol-add scheduled as well as as needed Continue with thiamine  and folate supplementation. TOC consulted Counseled extensively regarding alcohol  cessation   Seizure disorder: continue with home dose of Keppra  750 mg   Hypertension:  Continue with metoprolol  succinate 50 mg daily -Add Norvasc -$4 co-pay   Dehydration:  -Resolved   Hypomagnesemia:  -replete   Anxiety/major depression:  -Continue with escitalopram .  -No suicidal or homicidal ideations   Hyponatremia - Resolved    Medical Consultants:    None  Discharge Exam:   Vitals:   12/02/23 0652 12/02/23 1050  BP: (!) 139/91 (!) 136/94  Pulse: 74 86  Resp: 16 18  Temp: 98.4 F (36.9 C) 97.7 F (36.5 C)  SpO2: 100% 98%   Vitals:   12/01/23 2222 12/02/23 0315 12/02/23 0652 12/02/23 1050  BP: (!) 144/94  (!) 139/91 (!) 136/94  Pulse: 79 74 74 86  Resp: 17  16 18   Temp: 98.2 F (36.8 C)  98.4 F (36.9 C) 97.7 F (36.5 C)  TempSrc: Oral  Oral   SpO2: 100%  100% 98%  Weight:      Height:        General exam: Appears calm and comfortable.    The results of significant diagnostics from this hospitalization (including imaging, microbiology, ancillary and laboratory) are listed below for reference.     Procedures and Diagnostic Studies:   US  Abdomen Limited RUQ (LIVER/GB) Result Date: 11/29/2023 CLINICAL DATA:  Right upper quadrant pain. EXAM: ULTRASOUND ABDOMEN LIMITED RIGHT UPPER QUADRANT COMPARISON:  December 16, 2022 FINDINGS: Gallbladder: A mild amount of echogenic sludge is  seen within the dependent portion of the gallbladder lumen. No gallstones or wall thickening visualized (1.5 mm). No sonographic Murphy sign noted by sonographer. Common bile duct: Diameter: 3.8 mm Liver: No focal lesion identified. The liver parenchyma is coarse in echotexture and increased in parenchymal echogenicity. Portal vein is patent on color Doppler imaging with normal direction of blood  flow towards the liver. Other: None. IMPRESSION: 1. Gallbladder sludge without evidence of cholelithiasis or acute cholecystitis. 2. Hepatic steatosis. Electronically Signed   By: Suzen Dials M.D.   On: 11/29/2023 12:15     Labs:   Basic Metabolic Panel: Recent Labs  Lab 11/29/23 0838 11/29/23 2027 11/30/23 0459 12/01/23 0225 12/01/23 1533 12/02/23 0225  NA 136  --  129* 128* 134* 131*  K 3.9  --  3.7 2.9* 3.7 3.5  CL 99  --  97* 96* 103 100  CO2 22  --  20* 20* 19* 21*  GLUCOSE 110*  --  86 98 117* 100*  BUN 12  --  7 <5* <5* 5*  CREATININE 0.99 0.93 0.85 0.76 1.01 0.81  CALCIUM  8.0*  --  8.0* 8.0* 8.4* 8.5*  MG 1.6*  --   --  1.7  --   --   PHOS  --   --   --  2.2*  --   --    GFR Estimated Creatinine Clearance: 116.1 mL/min (by C-G formula based on SCr of 0.81 mg/dL). Liver Function Tests: Recent Labs  Lab 11/29/23 0838 11/30/23 0459 12/01/23 0225 12/02/23 0225  AST 1,860* 2,724* 1,811* 534*  ALT 469* 583* 578* 384*  ALKPHOS 141* 159* 191* 181*  BILITOT 1.5* 2.7* 1.9* 1.2  PROT 7.3 6.5 UNABLE TO REPORT DUE TO LIPEMIC INTERFERENCE 6.9  ALBUMIN 3.5 3.1* 3.2* 3.2*   No results for input(s): LIPASE, AMYLASE in the last 168 hours. No results for input(s): AMMONIA in the last 168 hours. Coagulation profile Recent Labs  Lab 11/29/23 0838  INR 1.1    CBC: Recent Labs  Lab 11/29/23 0838 11/29/23 2027 11/30/23 0459 12/01/23 0225 12/02/23 0225  WBC 2.2* 2.6* 2.7* 2.8* 4.4  NEUTROABS 1.1*  --   --   --   --   HGB 13.5 12.4* 11.5* 12.4* 12.2*  HCT 37.1* 34.6* 31.7* 33.2* 34.3*  MCV 96.6 97.2 97.2 96.8 98.8  PLT 160 150 128* 118* 123*   Cardiac Enzymes: No results for input(s): CKTOTAL, CKMB, CKMBINDEX, TROPONINI in the last 168 hours. BNP: Invalid input(s): POCBNP CBG: No results for input(s): GLUCAP in the last 168 hours. D-Dimer No results for input(s): DDIMER in the last 72 hours. Hgb A1c No results for input(s): HGBA1C  in the last 72 hours. Lipid Profile No results for input(s): CHOL, HDL, LDLCALC, TRIG, CHOLHDL, LDLDIRECT in the last 72 hours. Thyroid  function studies No results for input(s): TSH, T4TOTAL, T3FREE, THYROIDAB in the last 72 hours.  Invalid input(s): FREET3 Anemia work up No results for input(s): VITAMINB12, FOLATE, FERRITIN, TIBC, IRON, RETICCTPCT in the last 72 hours. Microbiology No results found for this or any previous visit (from the past 240 hours).   Discharge Instructions:   Discharge Instructions     Ambulatory referral to Gastroenterology   Complete by: As directed    What is the reason for referral?: Other Comment - cirrhosis   Diet general   Complete by: As directed    Discharge instructions   Complete by: As directed    Alcohol  cessation CMP 10 days Referral  to GI   Increase activity slowly   Complete by: As directed       Allergies as of 12/02/2023   No Known Allergies      Medication List     STOP taking these medications    pantoprazole  40 MG tablet Commonly known as: PROTONIX        TAKE these medications    amLODipine  10 MG tablet Commonly known as: NORVASC  Take 1 tablet (10 mg total) by mouth daily.   escitalopram  10 MG tablet Commonly known as: LEXAPRO  Take 1 tablet (10 mg total) by mouth daily.   folic acid  1 MG tablet Commonly known as: FOLVITE  Take 1 tablet (1 mg total) by mouth daily.   levETIRAcetam  750 MG tablet Commonly known as: KEPPRA  Take 1 tablet (750 mg total) by mouth 2 (two) times daily. C   magnesium  gluconate 500 (27 Mg) MG Tabs tablet Commonly known as: MAGONATE Take 500 mg by mouth daily.   metoprolol  succinate 50 MG 24 hr tablet Commonly known as: TOPROL -XL Take 1 tablet (50 mg total) by mouth daily.   multivitamin with minerals Tabs tablet Take 1 tablet by mouth daily.   Potassium 99 MG Tabs Take 1 tablet by mouth daily.   thiamine  100 MG tablet Commonly known as:  VITAMIN B1 Take 1 tablet (100 mg total) by mouth daily.        Follow-up Information     Caro Harlene POUR, NP Follow up in 1 week(s).   Specialty: Geriatric Medicine Why: CMP Contact information: [address] [place] [address] [phone]                  Time coordinating discharge: 45 minutes  Signed:  Harlene RAYMOND Bowl DO  [facility] 12/03/2023, 3:49 PM

## 2023-12-02 NOTE — Progress Notes (Incomplete)
 Phil Slade to be D/C'd  per MD order.  Discussed with the patient and all questions fully answered.  VSS, Skin clean, dry and intact without evidence of skin break down, no evidence of skin tears noted.  IV catheter discontinued intact. Site without signs and symptoms of complications. Dressing and pressure applied.  An After Visit Summary was printed and given to the patient. Patient received his prescription from main pharmacy. Patient receive work note from the MD.  D/c education completed with patient/family including follow up instructions, medication list, d/c activities limitations if indicated, with other d/c instructions as indicated by MD - patient able to verbalize understanding, all questions fully answered.   Patient instructed to return to ED, call 911, or call MD for any changes in condition.   Patient to be escorted via WC, and D/C home via private auto.

## 2023-12-12 ENCOUNTER — Telehealth: Payer: Self-pay | Admitting: Neurology

## 2023-12-12 NOTE — Telephone Encounter (Signed)
 Patient called to verify appointment time and if we had current insurance of [employer]. Informed patient can upload new insurance card on MyChart

## 2023-12-13 ENCOUNTER — Encounter: Payer: Self-pay | Admitting: Neurology

## 2023-12-13 ENCOUNTER — Ambulatory Visit: Payer: MEDICAID | Admitting: Neurology

## 2023-12-13 ENCOUNTER — Ambulatory Visit (INDEPENDENT_AMBULATORY_CARE_PROVIDER_SITE_OTHER): Payer: MEDICAID | Admitting: Neurology

## 2023-12-13 VITALS — BP 135/87 | HR 97 | Ht 68.0 in | Wt 184.0 lb

## 2023-12-13 DIAGNOSIS — F1011 Alcohol abuse, in remission: Secondary | ICD-10-CM

## 2023-12-13 DIAGNOSIS — F10930 Alcohol use, unspecified with withdrawal, uncomplicated: Secondary | ICD-10-CM

## 2023-12-13 DIAGNOSIS — S32000A Wedge compression fracture of unspecified lumbar vertebra, initial encounter for closed fracture: Secondary | ICD-10-CM

## 2023-12-13 DIAGNOSIS — R569 Unspecified convulsions: Secondary | ICD-10-CM | POA: Diagnosis not present

## 2023-12-13 MED ORDER — LEVETIRACETAM 750 MG PO TABS: 750.0000 mg | ORAL_TABLET | Freq: Two times a day (BID) | ORAL | 4 refills | Status: AC
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
## 2023-12-28 IMAGING — DX DG SHOULDER 2+V*L*
4 series · 4 of 4 positions shown · non-contrast
Comparison: Two-view chest x-ray 10/09/18

CLINICAL DATA: Left shoulder pain.

EXAM:
LEFT SHOULDER - 2+ VIEW

[shoulder ap]
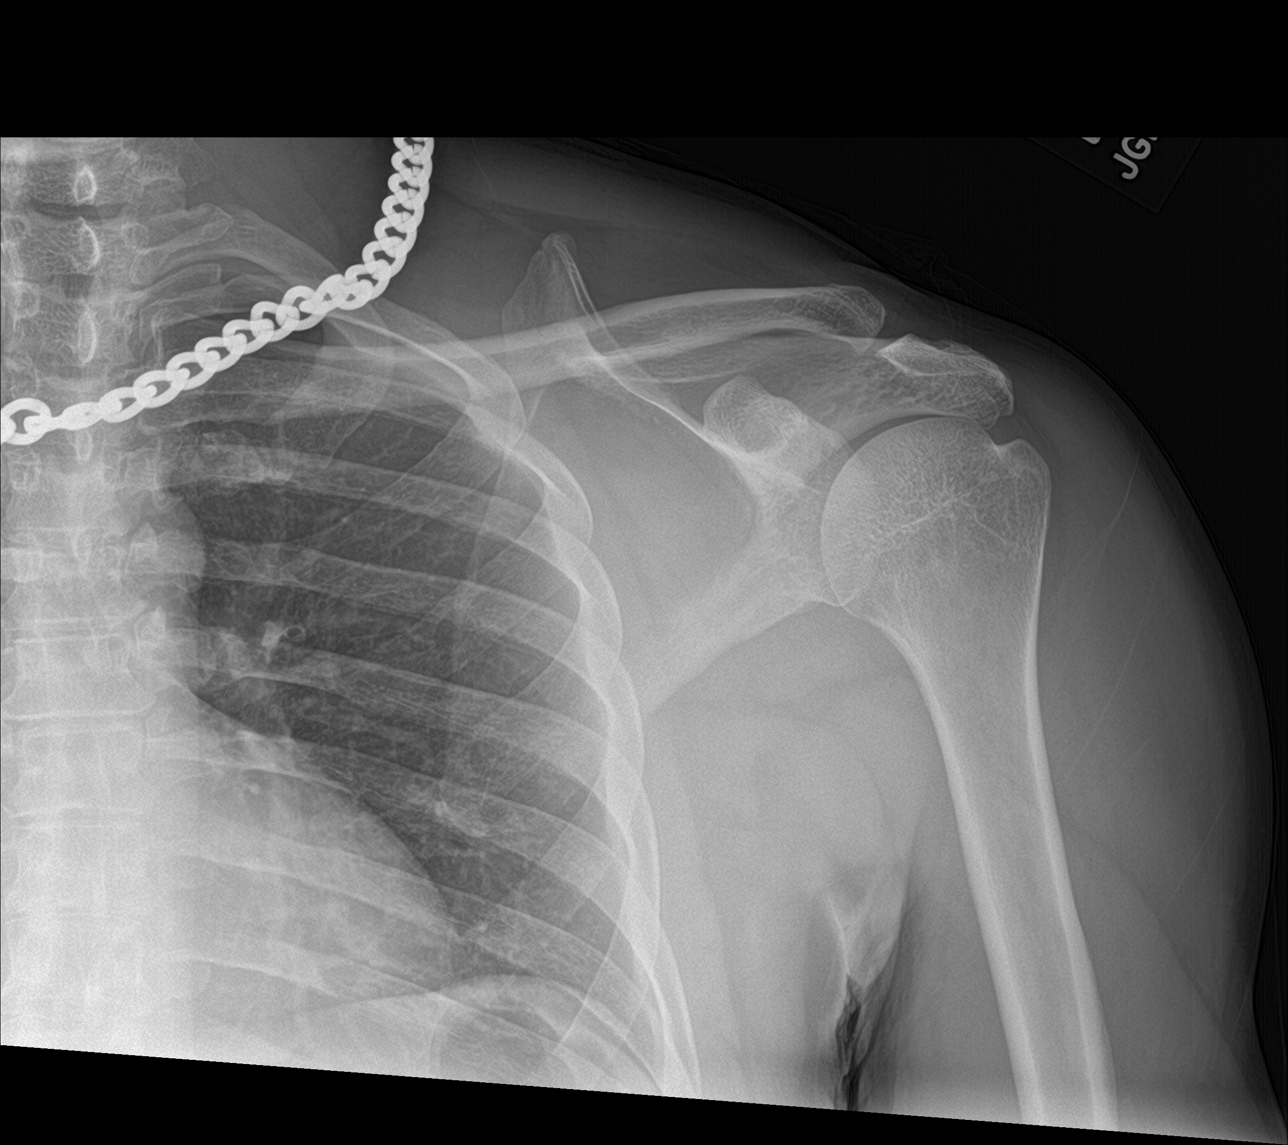

[shoulder grashey]
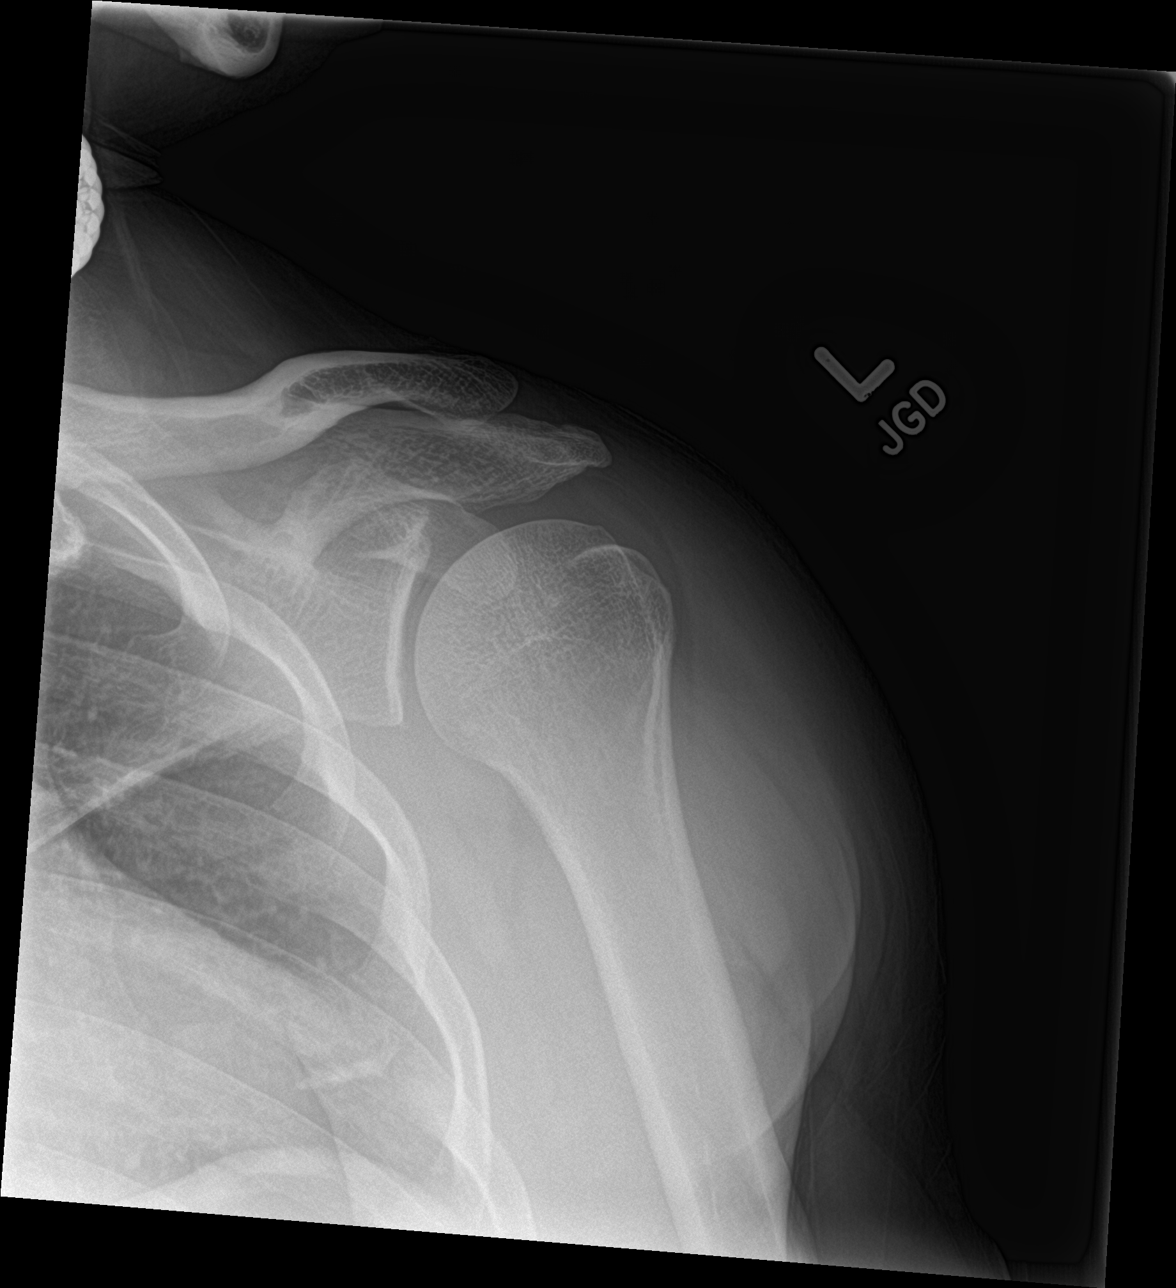

[shoulder y-view]
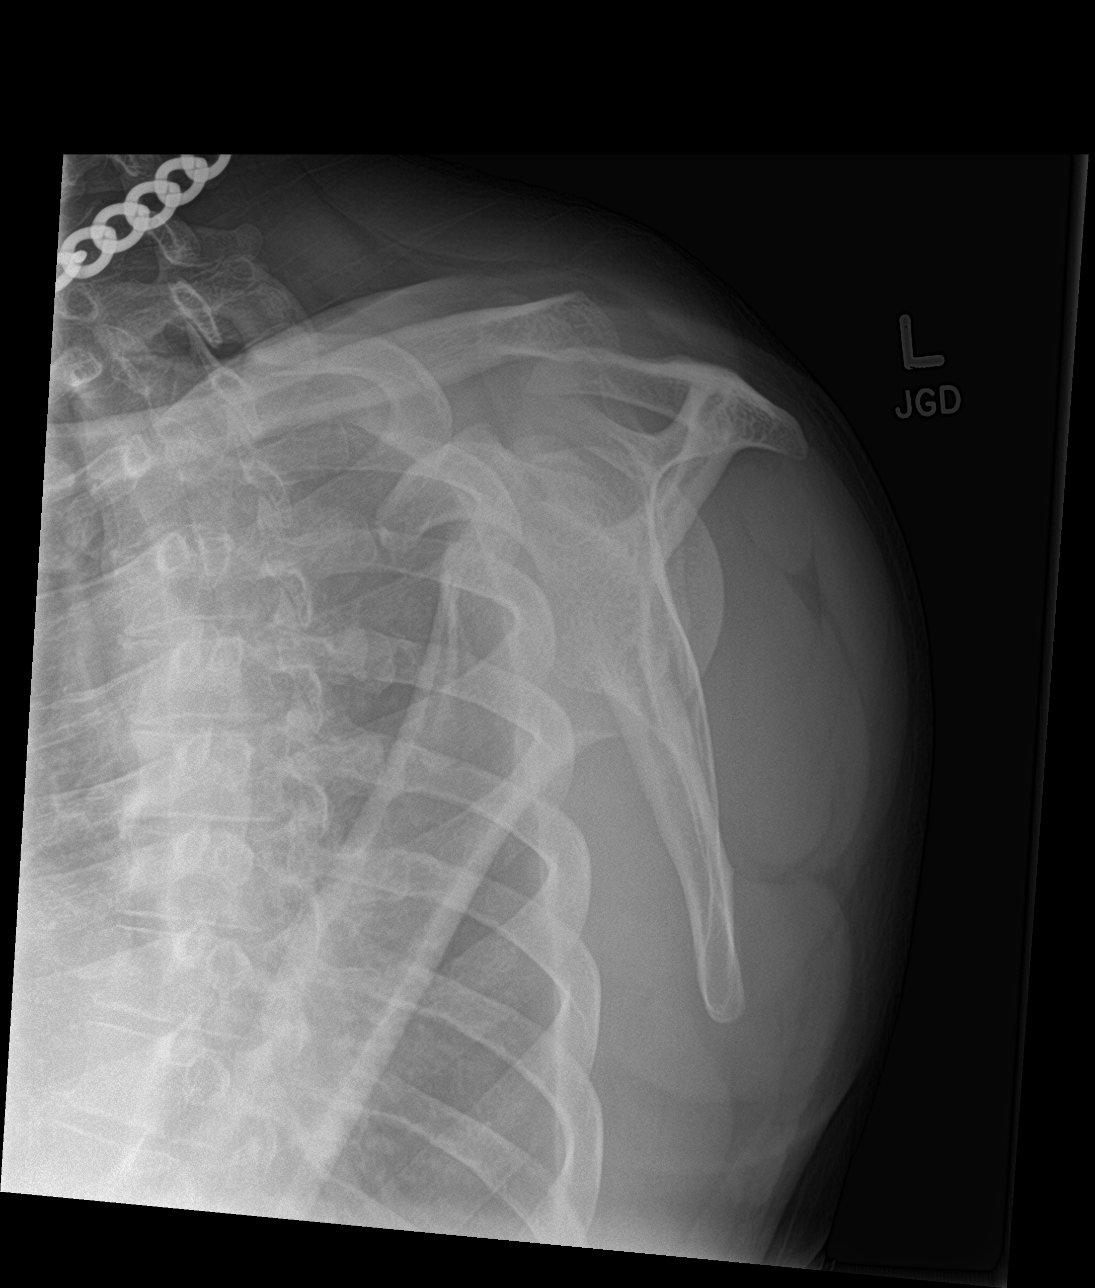

[shoulder axial]
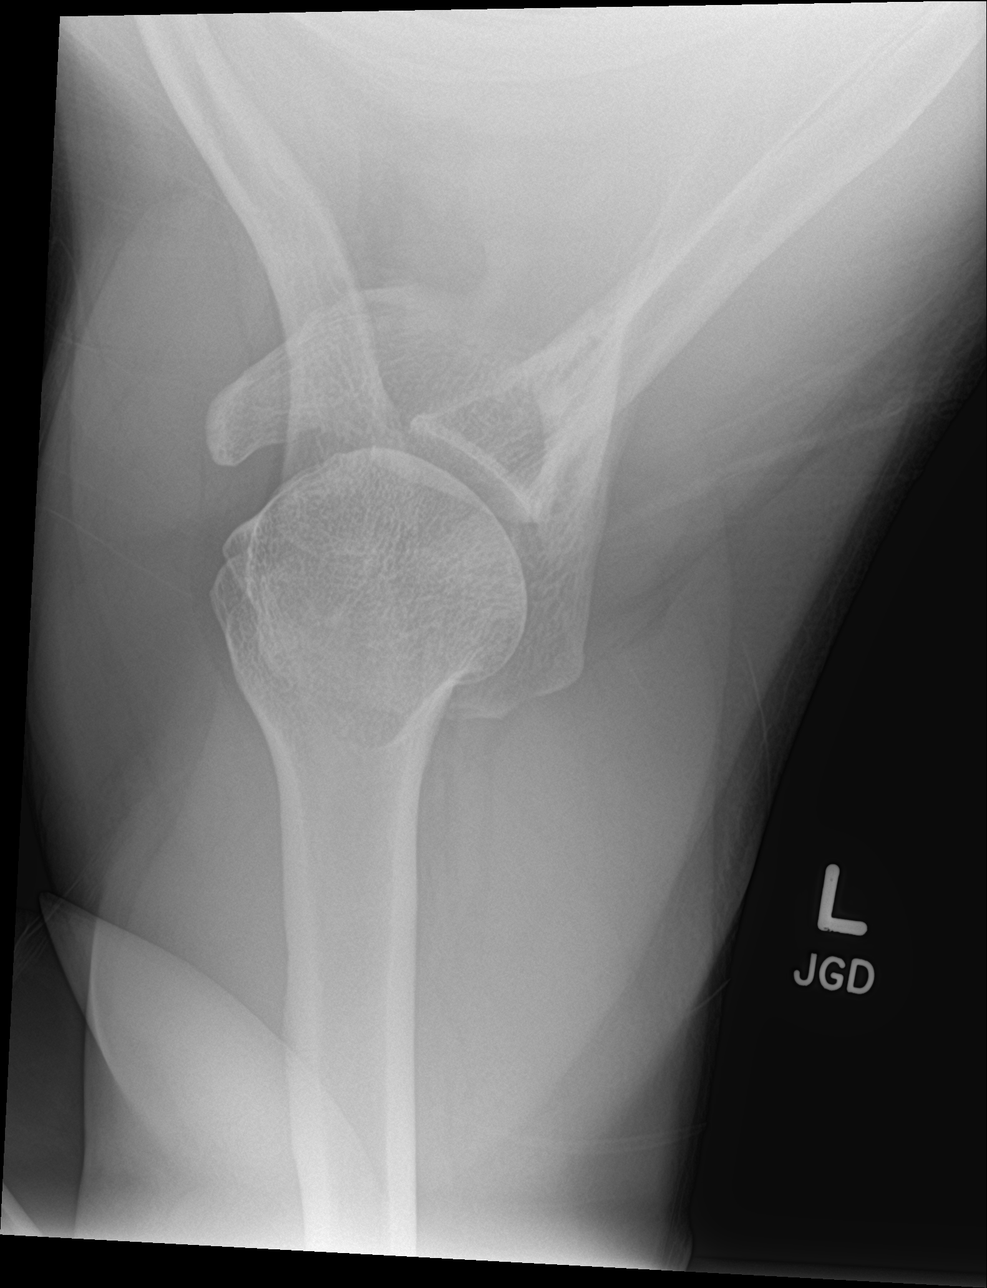

[4 of 4 positions shown; findings below may reference images not displayed]

FINDINGS: There is no evidence of fracture or dislocation. There is no
evidence of arthropathy or other focal bone abnormality. Soft
tissues are unremarkable.
IMPRESSION: Negative.

## 2023-12-30 ENCOUNTER — Other Ambulatory Visit: Payer: Self-pay | Admitting: Neurology

## 2024-01-02 NOTE — Telephone Encounter (Signed)
 Last seen on 12/13/23 Follow up scheduled 12/13/24     Too soon to fill just filled

## 2024-01-15 ENCOUNTER — Encounter (HOSPITAL_COMMUNITY): Payer: Self-pay

## 2024-01-15 ENCOUNTER — Other Ambulatory Visit: Payer: Self-pay

## 2024-01-15 ENCOUNTER — Emergency Department (HOSPITAL_COMMUNITY)
Admission: EM | Admit: 2024-01-15 | Discharge: 2024-01-15 | Disposition: A | Discharge status: Home / Self Care | Payer: MEDICAID | Attending: Emergency Medicine | Admitting: Emergency Medicine

## 2024-01-15 DIAGNOSIS — Z79899 Other long term (current) drug therapy: Secondary | ICD-10-CM | POA: Diagnosis not present

## 2024-01-15 DIAGNOSIS — F10929 Alcohol use, unspecified with intoxication, unspecified: Secondary | ICD-10-CM

## 2024-01-15 DIAGNOSIS — I10 Essential (primary) hypertension: Secondary | ICD-10-CM | POA: Insufficient documentation

## 2024-01-15 DIAGNOSIS — F10120 Alcohol abuse with intoxication, uncomplicated: Secondary | ICD-10-CM | POA: Diagnosis not present

## 2024-01-15 DIAGNOSIS — R253 Fasciculation: Secondary | ICD-10-CM | POA: Diagnosis not present

## 2024-01-15 DIAGNOSIS — Y908 Blood alcohol level of 240 mg/100 ml or more: Secondary | ICD-10-CM | POA: Insufficient documentation

## 2024-01-15 DIAGNOSIS — R110 Nausea: Secondary | ICD-10-CM | POA: Diagnosis present

## 2024-01-15 LAB — CBC
HCT: 38.4 % — ABNORMAL LOW (ref 39.0–52.0)
Hemoglobin: 13.8 g/dL (ref 13.0–17.0)
MCH: 34.3 pg — ABNORMAL HIGH (ref 26.0–34.0)
MCHC: 35.9 g/dL (ref 30.0–36.0)
MCV: 95.5 fL (ref 80.0–100.0)
Platelets: 313 K/uL (ref 150–400)
RBC: 4.02 MIL/uL — ABNORMAL LOW (ref 4.22–5.81)
RDW: 12.9 % (ref 11.5–15.5)
WBC: 3.5 K/uL — ABNORMAL LOW (ref 4.0–10.5)
nRBC: 0 % (ref 0.0–0.2)

## 2024-01-15 LAB — BASIC METABOLIC PANEL WITH GFR
Anion gap: 15 (ref 5–15)
BUN: 10 mg/dL (ref 6–20)
CO2: 21 mmol/L — ABNORMAL LOW (ref 22–32)
Calcium: 8.4 mg/dL — ABNORMAL LOW (ref 8.9–10.3)
Chloride: 100 mmol/L (ref 98–111)
Creatinine, Ser: 0.85 mg/dL (ref 0.61–1.24)
GFR, Estimated: >60 mL/min (ref >60)
Glucose, Bld: 114 mg/dL — ABNORMAL HIGH (ref 70–99)
Potassium: 3.7 mmol/L (ref 3.5–5.1)
Sodium: 136 mmol/L (ref 135–145)

## 2024-01-15 LAB — ETHANOL: Alcohol, Ethyl (B): 318 mg/dL (ref <15)

## 2024-01-15 MED ORDER — THIAMINE MONONITRATE 100 MG PO TABS
100.0000 mg | ORAL_TABLET | Freq: Every day | ORAL | Status: DC
  Administered 2024-01-15: 100 mg via ORAL
  Filled 2024-01-15: qty 1

## 2024-01-15 MED ORDER — LEVETIRACETAM 500 MG PO TABS
750.0000 mg | ORAL_TABLET | Freq: Once | ORAL | Status: AC
  Administered 2024-01-15: 750 mg via ORAL
  Filled 2024-01-15: qty 1

## 2024-01-15 MED ORDER — THIAMINE HCL 100 MG/ML IJ SOLN: 100.0000 mg | Freq: Every day | INTRAMUSCULAR | Status: DC

## 2024-01-15 MED ORDER — METOPROLOL SUCCINATE ER 25 MG PO TB24
50.0000 mg | ORAL_TABLET | Freq: Once | ORAL | Status: AC
  Administered 2024-01-15: 50 mg via ORAL
  Filled 2024-01-15: qty 2

## 2024-01-15 MED ORDER — LORAZEPAM 2 MG/ML IJ SOLN: 0.0000 mg | Freq: Four times a day (QID) | INTRAMUSCULAR | Status: DC

## 2024-01-15 MED ORDER — LORAZEPAM 2 MG/ML IJ SOLN: 0.0000 mg | Freq: Two times a day (BID) | INTRAMUSCULAR | Status: DC

## 2024-01-15 MED ORDER — LORAZEPAM 1 MG PO TABS: 0.0000 mg | ORAL_TABLET | Freq: Four times a day (QID) | ORAL | Status: DC

## 2024-01-15 MED ORDER — ONDANSETRON HCL 4 MG/2ML IJ SOLN
4.0000 mg | Freq: Once | INTRAMUSCULAR | Status: AC
  Administered 2024-01-15: 4 mg via INTRAVENOUS
  Filled 2024-01-15: qty 2

## 2024-01-15 MED ORDER — SODIUM CHLORIDE 0.9 % IV BOLUS
1000.0000 mL | Freq: Once | INTRAVENOUS | Status: AC
  Administered 2024-01-15: 1000 mL via INTRAVENOUS

## 2024-01-15 MED ORDER — LORAZEPAM 1 MG PO TABS: 0.0000 mg | ORAL_TABLET | Freq: Two times a day (BID) | ORAL | Status: DC

## 2024-01-15 MED ORDER — ONDANSETRON HCL 4 MG PO TABS: 4.0000 mg | ORAL_TABLET | Freq: Four times a day (QID) | ORAL | 0 refills | Status: AC

## 2024-01-15 MED ORDER — AMLODIPINE BESYLATE 5 MG PO TABS
10.0000 mg | ORAL_TABLET | Freq: Once | ORAL | Status: AC
  Administered 2024-01-15: 10 mg via ORAL
  Filled 2024-01-15: qty 2

## 2024-01-15 MED ORDER — CHLORDIAZEPOXIDE HCL 25 MG PO CAPS: ORAL_CAPSULE | ORAL | 0 refills | Status: AC

## 2024-01-15 NOTE — Discharge Instructions (Addendum)
 Evaluation today was reassuring overall.  Sent Librium  which is a medicine to treat for quitting alcohol .  Also recommend rehab and or detox centers.  [facility] and [facility] are to centers here in the area.  If your symptoms worsen or change anyway please return to the ED for further evaluation.  Otherwise also recommend that you follow-up with your PCP.

## 2024-01-15 NOTE — ED Notes (Signed)
 Patient ambulatory without assistance. Gait steady but pt reports dizziness. CCMD calling about patient HR sustaining 140s prior to ambulation. PA-C notified.

## 2024-01-15 NOTE — ED Provider Notes (Signed)
  EMERGENCY DEPARTMENT AT [facility] Provider Note   CSN: [id number] Arrival date & time: 01/15/24  1523     Patient presents with: Nausea and Alcohol  Problem  HPI Devon Hanson is a 43 y.o. male with seizures, paroxysmal A-fib, hypertension, alcohol  use disorder presenting for nausea and alcohol  problem.  He reports heavy drinking for the past couple of months.  Today he drank 512 ounce beers which she states is normal for him.  Last drink was 230 today.  He is here because of nausea and expressing a desire to quit.  He denies SI HI.  Denies seizures or hallucinations at home.  Reports that he did not take his Keppra , amlodipine  or metoprolol  today due to how he was feeling.    Alcohol  Problem       Prior to Admission medications   Medication Sig Start Date End Date Taking? Authorizing Provider  chlordiazePOXIDE  (LIBRIUM ) 25 MG capsule 50mg  PO TID x 1D, then 25-50mg  PO BID X 1D, then 25-50mg  PO QD X 1D 01/15/24  Yes Lamiyah Schlotter K, PA-C  ondansetron  (ZOFRAN ) 4 MG tablet Take 1 tablet (4 mg total) by mouth every 6 (six) hours. 01/15/24  Yes Revere Maahs K, PA-C  amLODipine  (NORVASC ) 10 MG tablet Take 1 tablet (10 mg total) by mouth daily. 12/02/23   Vann, Jessica U, DO  levETIRAcetam  (KEPPRA ) 750 MG tablet Take 1 tablet (750 mg total) by mouth 2 (two) times daily. C 12/13/23   Sater, Charlie LABOR, MD  magnesium  gluconate (MAGONATE) 500 (27 Mg) MG TABS tablet Take 500 mg by mouth daily.    [provider]  metoprolol  succinate (TOPROL -XL) 50 MG 24 hr tablet Take 1 tablet (50 mg total) by mouth daily. 04/06/23   Gonfa, Taye T, MD  Multiple Vitamin (MULTIVITAMIN WITH MINERALS) TABS tablet Take 1 tablet by mouth daily. 04/06/23   Gonfa, Taye T, MD  Potassium 99 MG TABS Take 1 tablet by mouth daily.    [provider]    Allergies: Patient has no known allergies.    Review of Systems See HPI  Updated Vital Signs BP (!) 161/95   Pulse (!) 129    Temp 99 F (37.2 C) (Oral)   Resp 16   Ht 5' 8 (1.727 m)   Wt 79.4 kg   SpO2 98%   BMI 26.61 kg/m   Physical Exam Vitals and nursing note reviewed.  HENT:     Head: Normocephalic and atraumatic.     Comments: Tongue fasciculations noted    Mouth/Throat:     Mouth: Mucous membranes are moist.  Eyes:     General:        Right eye: No discharge.        Left eye: No discharge.     Conjunctiva/sclera: Conjunctivae normal.  Cardiovascular:     Rate and Rhythm: Regular rhythm. Tachycardia present.     Pulses: Normal pulses.     Heart sounds: Normal heart sounds.  Pulmonary:     Effort: Pulmonary effort is normal.     Breath sounds: Normal breath sounds.  Abdominal:     General: Abdomen is flat.     Palpations: Abdomen is soft.  Skin:    General: Skin is warm and dry.  Neurological:     General: No focal deficit present.  Psychiatric:        Mood and Affect: Mood normal.     (all labs ordered are listed, but only abnormal results are  displayed) Labs Reviewed  CBC - Abnormal; Notable for the following components:      Result Value   WBC 3.5 (*)    RBC 4.02 (*)    HCT 38.4 (*)    MCH 34.3 (*)    All other components within normal limits  BASIC METABOLIC PANEL WITH GFR - Abnormal; Notable for the following components:   CO2 21 (*)    Glucose, Bld 114 (*)    Calcium  8.4 (*)    All other components within normal limits  ETHANOL - Abnormal; Notable for the following components:   Alcohol , Ethyl (B) 318 (*)    All other components within normal limits    EKG: EKG Interpretation Date/Time:  Sunday January 15 2024 19:21:43 EST Ventricular Rate:  126 PR Interval:  160 QRS Duration:  79 QT Interval:  304 QTC Calculation: 441 R Axis:   11  Text Interpretation: Incomplete analysis due to missing data in precordial lead(s) Sinus tachycardia Missing lead(s): V6 no sig change from previous Confirmed by Pfeiffer, Marcy ([id number]) on 01/15/2024 7:34:00  PM  Radiology: No results found.   Procedures   Medications Ordered in the ED  LORazepam (ATIVAN) injection 0-4 mg ( Intravenous See Alternative 01/15/24 1703)    Or  LORazepam (ATIVAN) tablet 0-4 mg (0 mg Oral Hold 01/15/24 1703)  LORazepam (ATIVAN) injection 0-4 mg (has no administration in time range)    Or  LORazepam (ATIVAN) tablet 0-4 mg (has no administration in time range)  thiamine (VITAMIN B1) tablet 100 mg (100 mg Oral Given 01/15/24 1727)    Or  thiamine (VITAMIN B1) injection 100 mg ( Intravenous See Alternative 01/15/24 1727)  levETIRAcetam (KEPPRA) tablet 750 mg (has no administration in time range)  metoprolol succinate (TOPROL-XL) 24 hr tablet 50 mg (has no administration in time range)  amLODipine (NORVASC) tablet 10 mg (has no administration in time range)  sodium chloride 0.9 % bolus 1,000 mL (0 mLs Intravenous Stopped 01/15/24 1905)  ondansetron (ZOFRAN) injection 4 mg (4 mg Intravenous Given 01/15/24 1921)                                    Medical Decision Making Amount and/or Complexity of Data Reviewed Labs: ordered.  Risk OTC drugs. Prescription drug management.   43 year old well-appearing male presenting for alcohol intoxication and nausea. Exam was notable for tachycardia and tongue fasciculations.  DDx includes alcohol intoxication, alcohol withdrawal, DT, infection, electrolyte derangement, other. Initial CIWA score was 2 and ethanol level was 318.  Was persistently tachycardic throughout encounter. Ordered EKG which showed sinus tachycardia.  Suspect tachycardia is likely related to his alcohol consumption and missed doses of metoprolol.  Also ordered his home dose of metoprolol amlodipine and Keppra. Denies chest pain and shortness of breath on reassessment.  Able to ambulate and fluid challenge without issue.  Advised him to follow-up with his PCP and recommended alcohol rehab facilities locally.  Also sent Librium to his pharmacy.  Discussed  return precautions and had a lengthy discussion about alcohol cessation.  Discharged.     Final diagnoses:  Alcoholic intoxication with complication    ED Discharge Orders          Ordered    chlordiazePOXIDE (LIBRIUM) 25 MG capsule        11 /30/25 1905    ondansetron  (ZOFRAN ) 4 MG tablet  Every 6 hours  01/15/24 1905               Lang Norleen POUR, PA-C 01/15/24 1936    Armenta Canning, MD 01/15/24 2204

## 2024-01-15 NOTE — ED Triage Notes (Signed)
 Patient bib [facility] coming from home. Pt called EMS and reports binge drinking/drinking five 12oz beers today. Pt reports he's been going through a lot. Pt c/o nausea. GCS 15.  EMS VS: 180/100 160p 98% RA 163cbg

## 2024-01-15 NOTE — ED Notes (Signed)
 ED Provider at bedside.

## 2024-01-15 NOTE — ED Notes (Signed)
 Pt tolerated PO fluids. Pt denies Nausea or Vomiting

## 2024-01-21 ENCOUNTER — Other Ambulatory Visit: Payer: Self-pay | Admitting: Nurse Practitioner

## 2024-01-30 ENCOUNTER — Telehealth: Payer: Self-pay | Admitting: *Deleted

## 2024-01-30 DIAGNOSIS — Z0289 Encounter for other administrative examinations: Secondary | ICD-10-CM

## 2024-01-30 NOTE — Telephone Encounter (Signed)
 DMV for completion and signature.

## 2024-01-31 NOTE — Telephone Encounter (Signed)
 Signed.  To Medical Records.

## 2024-02-19 IMAGING — XA DG FLUORO GUIDE NDL PLC/BX
1 series · 1 of 1 positions shown · non-contrast
Comparison: none

CLINICAL DATA: Recent injury with left shoulder pain and limited
range of motion.

[Series 1: ortho adipose · 1 of 1 slices shown]
[im 1/1]
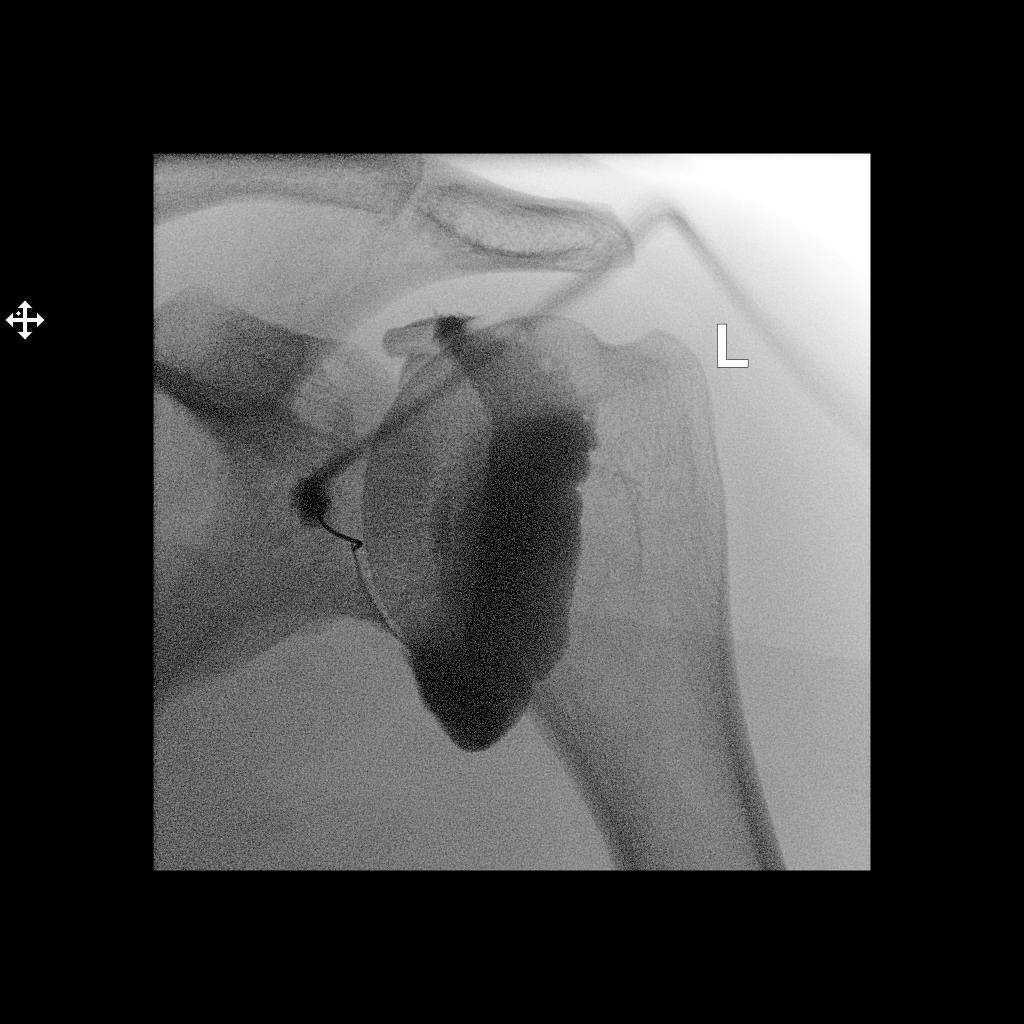

[1 of 1 positions shown; findings below may reference images not displayed]

FLUOROSCOPY:
0 minutes 12 seconds.  3.25 micro gray meter squared

PROCEDURE:
Left SHOULDER INJECTION UNDER FLUOROSCOPY

The skin overlying the shoulder was scrubbed with Betadine and
draped in sterile fashion. Skin and subcutaneous anesthesia was
carried out using a 25 gauge needle and 1% lidocaine. A 22 gauge
spinal needle was directed under fluoroscopic guidance on one pass
into the glenohumeral joint. 20 cc of a mixture of 0.1 cc MultiHance
and dilute Isovue 200 was then used to fill the glenohumeral joint.
IMPRESSION: Technically successful left shoulder injection for MRI.

## 2024-03-09 ENCOUNTER — Emergency Department (HOSPITAL_COMMUNITY): Payer: MEDICAID

## 2024-03-09 ENCOUNTER — Emergency Department (HOSPITAL_COMMUNITY)
Admission: EM | Admit: 2024-03-09 | Discharge: 2024-03-09 | Disposition: A | Discharge status: Home / Self Care | Payer: MEDICAID | Attending: Emergency Medicine | Admitting: Emergency Medicine

## 2024-03-09 ENCOUNTER — Encounter (HOSPITAL_COMMUNITY): Payer: Self-pay | Admitting: Emergency Medicine

## 2024-03-09 DIAGNOSIS — Z79899 Other long term (current) drug therapy: Secondary | ICD-10-CM | POA: Insufficient documentation

## 2024-03-09 DIAGNOSIS — M545 Low back pain, unspecified: Secondary | ICD-10-CM | POA: Insufficient documentation

## 2024-03-09 DIAGNOSIS — I10 Essential (primary) hypertension: Secondary | ICD-10-CM | POA: Insufficient documentation

## 2024-03-09 DIAGNOSIS — R000 Tachycardia, unspecified: Secondary | ICD-10-CM | POA: Insufficient documentation

## 2024-03-09 MED ORDER — METOPROLOL SUCCINATE ER 25 MG PO TB24
50.0000 mg | ORAL_TABLET | Freq: Every day | ORAL | Status: DC
  Administered 2024-03-09: 50 mg via ORAL
  Filled 2024-03-09: qty 2

## 2024-03-09 MED ORDER — METOPROLOL SUCCINATE ER 50 MG PO TB24: 50.0000 mg | ORAL_TABLET | Freq: Every day | ORAL | 0 refills | Status: AC

## 2024-03-09 MED ORDER — IBUPROFEN 600 MG PO TABS: 600.0000 mg | ORAL_TABLET | Freq: Three times a day (TID) | ORAL | 0 refills | Status: AC | PRN

## 2024-03-09 MED ORDER — METHOCARBAMOL 500 MG PO TABS: 500.0000 mg | ORAL_TABLET | Freq: Three times a day (TID) | ORAL | 0 refills | Status: AC | PRN

## 2024-03-09 MED ORDER — IBUPROFEN 400 MG PO TABS
600.0000 mg | ORAL_TABLET | Freq: Once | ORAL | Status: AC
  Administered 2024-03-09: 600 mg via ORAL
  Filled 2024-03-09: qty 1

## 2024-03-09 NOTE — ED Provider Notes (Signed)
 " [place] EMERGENCY DEPARTMENT AT [facility] Provider Note   CSN: [id number] Arrival date & time: 03/09/24  9171     Patient presents with: Back Pain   Devon Hanson is a 44 y.o. male.  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  Back pain is worsened over the last week.  Has tried Tylenol  without improvement.  Primarily in his lower back, as he says sometimes it will go down his legs.  Not associated with bowel or bladder incontinence.  No numbness or weakness.  No radiation to abdomen.  No new trauma.   The history is provided by the patient.  Back Pain Location:  Lumbar spine Quality:  Aching Pain severity:  Severe Pain is:  Same all the time Onset quality:  Gradual Duration:  1 week Timing:  Constant Progression:  Unchanged Chronicity:  Recurrent Worsened by:  Ambulation and bending Ineffective treatments:  OTC medications Associated symptoms: no abdominal pain, no bladder incontinence, no bowel incontinence, no fever, no leg pain, no numbness and no weakness        Prior to Admission medications  Medication Sig Start Date End Date Taking? Authorizing Provider  amLODipine  (NORVASC ) 10 MG tablet Take 1 tablet (10 mg total) by mouth daily. 12/02/23   Vann, Jessica U, DO  chlordiazePOXIDE  (LIBRIUM ) 25 MG capsule 50mg  PO TID x 1D, then 25-50mg  PO BID X 1D, then 25-50mg  PO QD X 1D 01/15/24   Robinson, John K, PA-C  levETIRAcetam  (KEPPRA ) 750 MG tablet Take 1 tablet (750 mg total) by mouth 2 (two) times daily. C 12/13/23   Sater, Charlie LABOR, MD  magnesium  gluconate (MAGONATE) 500 (27 Mg) MG TABS tablet Take 500 mg by mouth daily.    [provider]  metoprolol  succinate (TOPROL -XL) 50 MG 24 hr tablet Take 1 tablet (50 mg total) by mouth daily. 04/06/23   Gonfa, Taye T, MD  Multiple Vitamin (MULTIVITAMIN WITH MINERALS) TABS tablet Take 1 tablet by mouth daily. 04/06/23   Gonfa, Taye T, MD  ondansetron  (ZOFRAN ) 4 MG tablet Take 1 tablet (4  mg total) by mouth every 6 (six) hours. 01/15/24   Robinson, John K, PA-C  Potassium 99 MG TABS Take 1 tablet by mouth daily.    [provider]    Allergies: Patient has no known allergies.    Review of Systems  Constitutional:  Negative for fever.  Gastrointestinal:  Negative for abdominal pain and bowel incontinence.  Genitourinary:  Negative for bladder incontinence.  Musculoskeletal:  Positive for back pain.  Neurological:  Negative for weakness and numbness.    Updated Vital Signs BP (!) 148/110 (BP Location: Right Arm)   Pulse (!) 131   Temp 98.9 F (37.2 C) (Oral)   Resp 18   SpO2 94%   Physical Exam Vitals and nursing note reviewed.  Constitutional:      General: He is not in acute distress.    Appearance: Normal appearance. He is well-developed.  HENT:     Head: Normocephalic and atraumatic.  Eyes:     Conjunctiva/sclera: Conjunctivae normal.  Cardiovascular:     Rate and Rhythm: Regular rhythm. Tachycardia present.     Heart sounds: No murmur heard. Pulmonary:     Effort: Pulmonary effort is normal. No respiratory distress.     Breath sounds: Normal breath sounds.  Abdominal:     Palpations: Abdomen is soft.     Tenderness: There is no abdominal tenderness. There is  no guarding or rebound.  Musculoskeletal:        General: Tenderness (lumbar paralumbar) present. No deformity.     Cervical back: Neck supple.  Skin:    General: Skin is warm and dry.     Capillary Refill: Capillary refill takes less than 2 seconds.  Neurological:     General: No focal deficit present.     Mental Status: He is alert.     Motor: No weakness.     Gait: Gait normal.     (all labs ordered are listed, but only abnormal results are displayed) Labs Reviewed - No data to display  EKG: None  Radiology: DG Lumbar Spine Complete Result Date: 03/09/2024 CLINICAL DATA:  Lower back pain EXAM: LUMBAR SPINE - COMPLETE 4+ VIEW COMPARISON:  August 23, 2023 FINDINGS: No acute  fracture or spondylolisthesis is noted. Mild superior endplate depression of L1 vertebral body is noted and unchanged compared to prior exam consistent with old fracture. Old fractures involving multiple lower thoracic vertebral body are also noted. Disc spaces are well-maintained. IMPRESSION: Old thoracolumbar fractures as described above. No acute abnormality seen. Electronically Signed   By: Lynwood Landy Raddle M.D.   On: 03/09/2024 10:24     Procedures   Medications Ordered in the ED  ibuprofen  (ADVIL ) tablet 600 mg (600 mg Oral Given 03/09/24 0915)    Clinical Course as of 03/09/24 1708  Fri Mar 09, 2024  0930 On review of prior notes had mild compression deformities T10-L1 in the setting of trauma in 2023 [MB]  0959 X-rays of patient's lumbar spine do not show any acute fracture.  Awaiting radiology reading. [MB]    Clinical Course User Index [MB] Towana Ozell BROCKS, MD                                 Medical Decision Making Amount and/or Complexity of Data Reviewed Radiology: ordered.  Risk Prescription drug management.   This patient complains of low back pain; this involves an extensive number of treatment Options and is a complaint that carries with it a high risk of complications and morbidity. The differential includes musculoskeletal pain, compression fracture, radiculopathy, spinal stenosis I ordered medication ibuprofen  and reviewed PMP when indicated. I ordered imaging studies which included lumbar spine and I independently    visualized and interpreted imaging which showed no acute changes Previous records obtained and reviewed in epic, prior ED visits for alcohol  issues Social determinants considered, no significant barriers Critical Interventions: None  After the interventions stated above, I reevaluated the patient and found patient to be nontoxic-appearing neurovascularly intact Admission and further testing considered, no indications for admission at this time.   Blood pressure and heart rate elevated, has not been taking his beta-blocker.  Will refill that and start him on ibuprofen  and a muscle relaxant.  Close follow-up with PCP.  Return instructions discussed      Final diagnoses:  Hypertension, unspecified type  Acute midline low back pain without sciatica    ED Discharge Orders          Ordered    metoprolol  succinate (TOPROL -XL) 50 MG 24 hr tablet  Daily        03/09/24 1023    ibuprofen  (ADVIL ) 600 MG tablet  Every 8 hours PRN        03/09/24 1023    methocarbamol  (ROBAXIN ) 500 MG tablet  Every 8 hours PRN  03/09/24 1023               Towana Ozell BROCKS, MD 03/09/24 1710  "

## 2024-03-09 NOTE — ED Provider Triage Note (Signed)
 Emergency Medicine Provider Triage Evaluation Note  Tamar Lipscomb , a 44 y.o. male  was evaluated in triage.  Pt complains of lower back pain.  Review of Systems  Positive: Back pain Negative: Numbness  Physical Exam  BP (!) 148/110 (BP Location: Right Arm)   Pulse (!) 131   Temp 98.9 F (37.2 C) (Oral)   Resp 18   SpO2 94%  Gen:   Awake, no distress   Resp:  Normal effort  MSK:   Moves extremities without difficulty  Other:    Medical Decision Making  Medically screening exam initiated at 9:09 AM.  Appropriate orders placed.  Iasiah Ozment was informed that the remainder of the evaluation will be completed by another provider, this initial triage assessment does not replace that evaluation, and the importance of remaining in the ED until their evaluation is complete.     Towana Ozell BROCKS, MD 03/09/24 [phone]

## 2024-03-09 NOTE — ED Notes (Signed)
 Pt sitting in chair, pt has a slight decrease in pain, pt states that he is ready to go home, read and reviewed d/c instructions and follow up, pt verbalized understanding, pt from department.

## 2024-03-09 NOTE — Discharge Instructions (Addendum)
 Ibuprofen  3 times a day with food and muscle relaxant as needed.  Follow-up with your primary care doctor.  Restart your blood pressure medication.
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
## 2024-03-12 ENCOUNTER — Encounter: Payer: MEDICAID | Admitting: Nurse Practitioner

## 2024-03-13 ENCOUNTER — Encounter: Payer: Self-pay | Admitting: Neurology

## 2024-04-16 ENCOUNTER — Inpatient Hospital Stay: Admitting: Nurse Practitioner

## 2024-12-13 ENCOUNTER — Ambulatory Visit: Payer: Self-pay | Admitting: Adult Health
